# Patient Record
Sex: Female | Born: 1960 | Race: White | Hispanic: No | Marital: Married | Smoking: Former smoker
Health system: Southern US, Community
[De-identification: ages and names within clinical notes are randomized; demographics above are authoritative.]

## PROBLEM LIST (undated history)

## (undated) ENCOUNTER — Emergency Department (HOSPITAL_COMMUNITY): Admission: EM | Payer: BC Managed Care – PPO | Source: Home / Self Care

## (undated) DIAGNOSIS — Z9889 Other specified postprocedural states: Secondary | ICD-10-CM

## (undated) DIAGNOSIS — T4145XA Adverse effect of unspecified anesthetic, initial encounter: Secondary | ICD-10-CM

## (undated) DIAGNOSIS — M1990 Unspecified osteoarthritis, unspecified site: Secondary | ICD-10-CM

## (undated) DIAGNOSIS — K635 Polyp of colon: Secondary | ICD-10-CM

## (undated) DIAGNOSIS — F320 Major depressive disorder, single episode, mild: Secondary | ICD-10-CM

## (undated) DIAGNOSIS — A0472 Enterocolitis due to Clostridium difficile, not specified as recurrent: Secondary | ICD-10-CM

## (undated) DIAGNOSIS — K219 Gastro-esophageal reflux disease without esophagitis: Secondary | ICD-10-CM

## (undated) DIAGNOSIS — R112 Nausea with vomiting, unspecified: Secondary | ICD-10-CM

## (undated) DIAGNOSIS — T8859XA Other complications of anesthesia, initial encounter: Secondary | ICD-10-CM

## (undated) DIAGNOSIS — F32A Depression, unspecified: Secondary | ICD-10-CM

## (undated) DIAGNOSIS — G709 Myoneural disorder, unspecified: Secondary | ICD-10-CM

## (undated) DIAGNOSIS — K449 Diaphragmatic hernia without obstruction or gangrene: Secondary | ICD-10-CM

## (undated) DIAGNOSIS — E7800 Pure hypercholesterolemia, unspecified: Secondary | ICD-10-CM

## (undated) DIAGNOSIS — R519 Headache, unspecified: Secondary | ICD-10-CM

## (undated) DIAGNOSIS — K811 Chronic cholecystitis: Secondary | ICD-10-CM

## (undated) HISTORY — DX: Depression, unspecified: F32.A

## (undated) HISTORY — DX: Major depressive disorder, single episode, mild: F32.0

## (undated) HISTORY — PX: KNEE ARTHROSCOPY: SHX127

## (undated) HISTORY — PX: AUGMENTATION MAMMAPLASTY: SUR837

## (undated) HISTORY — PX: CHOLECYSTECTOMY: SHX55

## (undated) HISTORY — DX: Pure hypercholesterolemia, unspecified: E78.00

## (undated) HISTORY — PX: ABDOMINAL HYSTERECTOMY: SHX81

## (undated) HISTORY — DX: Polyp of colon: K63.5

## (undated) HISTORY — DX: Chronic cholecystitis: K81.1

## (undated) HISTORY — PX: TOTAL SHOULDER REPLACEMENT: SUR1217

## (undated) HISTORY — DX: Gastro-esophageal reflux disease without esophagitis: K21.9

## (undated) HISTORY — DX: Enterocolitis due to Clostridium difficile, not specified as recurrent: A04.72

## (undated) HISTORY — DX: Diaphragmatic hernia without obstruction or gangrene: K44.9

## (undated) HISTORY — DX: Unspecified osteoarthritis, unspecified site: M19.90

## (undated) HISTORY — PX: OTHER SURGICAL HISTORY: SHX169

## (undated) HISTORY — DX: Myoneural disorder, unspecified: G70.9

## (undated) HISTORY — PX: NECK SURGERY: SHX720

---
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
## 1987-10-29 HISTORY — PX: TOTAL ABDOMINAL HYSTERECTOMY W/ BILATERAL SALPINGOOPHORECTOMY: SHX83

## 2000-02-14 ENCOUNTER — Encounter: Admission: RE | Admit: 2000-02-14 | Discharge: 2000-02-14 | Payer: Self-pay | Admitting: General Surgery

## 2000-02-14 ENCOUNTER — Encounter: Payer: Self-pay | Admitting: General Surgery

## 2002-12-28 ENCOUNTER — Ambulatory Visit (HOSPITAL_BASED_OUTPATIENT_CLINIC_OR_DEPARTMENT_OTHER): Admission: RE | Admit: 2002-12-28 | Discharge: 2002-12-28 | Payer: Self-pay | Admitting: Orthopedic Surgery

## 2004-10-02 ENCOUNTER — Ambulatory Visit: Payer: Self-pay | Admitting: General Surgery

## 2005-04-23 ENCOUNTER — Ambulatory Visit: Payer: Self-pay | Admitting: Urology

## 2005-08-29 ENCOUNTER — Ambulatory Visit: Payer: Self-pay | Admitting: Unknown Physician Specialty

## 2005-10-08 ENCOUNTER — Ambulatory Visit: Payer: Self-pay | Admitting: General Surgery

## 2006-01-13 ENCOUNTER — Encounter: Admission: RE | Admit: 2006-01-13 | Discharge: 2006-01-13 | Payer: Self-pay | Admitting: Neurosurgery

## 2006-02-27 ENCOUNTER — Ambulatory Visit (HOSPITAL_COMMUNITY): Admission: RE | Admit: 2006-02-27 | Discharge: 2006-02-28 | Payer: Self-pay | Admitting: Neurosurgery

## 2006-03-10 ENCOUNTER — Ambulatory Visit: Payer: Self-pay | Admitting: Pain Medicine

## 2006-03-16 ENCOUNTER — Emergency Department: Payer: Self-pay | Admitting: Internal Medicine

## 2006-03-19 ENCOUNTER — Encounter: Admission: RE | Admit: 2006-03-19 | Discharge: 2006-03-19 | Payer: Self-pay | Admitting: Neurosurgery

## 2006-07-31 ENCOUNTER — Emergency Department: Payer: Self-pay | Admitting: Emergency Medicine

## 2006-10-28 DIAGNOSIS — A0472 Enterocolitis due to Clostridium difficile, not specified as recurrent: Secondary | ICD-10-CM

## 2006-10-28 DIAGNOSIS — K635 Polyp of colon: Secondary | ICD-10-CM

## 2006-10-28 HISTORY — PX: COLONOSCOPY: SHX174

## 2006-10-28 HISTORY — DX: Enterocolitis due to Clostridium difficile, not specified as recurrent: A04.72

## 2006-10-28 HISTORY — DX: Polyp of colon: K63.5

## 2006-10-28 HISTORY — PX: SPINE SURGERY: SHX786

## 2006-11-12 ENCOUNTER — Ambulatory Visit: Payer: Self-pay | Admitting: Rheumatology

## 2006-12-31 ENCOUNTER — Ambulatory Visit: Payer: Self-pay

## 2007-02-17 ENCOUNTER — Ambulatory Visit: Payer: Self-pay | Admitting: Unknown Physician Specialty

## 2007-03-16 ENCOUNTER — Ambulatory Visit: Payer: Self-pay | Admitting: Unknown Physician Specialty

## 2007-03-18 ENCOUNTER — Ambulatory Visit: Payer: Self-pay | Admitting: Pain Medicine

## 2007-04-23 ENCOUNTER — Ambulatory Visit: Payer: Self-pay | Admitting: Physical Medicine & Rehabilitation

## 2007-04-23 ENCOUNTER — Encounter
Admission: RE | Admit: 2007-04-23 | Discharge: 2007-07-22 | Payer: Self-pay | Admitting: Physical Medicine & Rehabilitation

## 2007-05-26 ENCOUNTER — Ambulatory Visit: Payer: Self-pay | Admitting: Physical Medicine & Rehabilitation

## 2007-07-03 ENCOUNTER — Ambulatory Visit: Payer: Self-pay | Admitting: Unknown Physician Specialty

## 2007-07-17 ENCOUNTER — Ambulatory Visit: Payer: Self-pay | Admitting: Physical Medicine & Rehabilitation

## 2007-09-14 ENCOUNTER — Encounter
Admission: RE | Admit: 2007-09-14 | Discharge: 2007-09-16 | Payer: Self-pay | Admitting: Physical Medicine & Rehabilitation

## 2007-09-14 ENCOUNTER — Ambulatory Visit: Payer: Self-pay | Admitting: Physical Medicine & Rehabilitation

## 2007-10-29 DIAGNOSIS — G709 Myoneural disorder, unspecified: Secondary | ICD-10-CM

## 2007-10-29 HISTORY — DX: Myoneural disorder, unspecified: G70.9

## 2007-12-07 ENCOUNTER — Encounter
Admission: RE | Admit: 2007-12-07 | Discharge: 2007-12-08 | Payer: Self-pay | Admitting: Physical Medicine & Rehabilitation

## 2007-12-08 ENCOUNTER — Ambulatory Visit: Payer: Self-pay | Admitting: Physical Medicine & Rehabilitation

## 2008-03-02 ENCOUNTER — Encounter
Admission: RE | Admit: 2008-03-02 | Discharge: 2008-03-04 | Payer: Self-pay | Admitting: Physical Medicine & Rehabilitation

## 2008-03-04 ENCOUNTER — Ambulatory Visit: Payer: Self-pay | Admitting: Physical Medicine & Rehabilitation

## 2008-05-27 ENCOUNTER — Encounter
Admission: RE | Admit: 2008-05-27 | Discharge: 2008-05-31 | Payer: Self-pay | Admitting: Physical Medicine & Rehabilitation

## 2008-05-31 ENCOUNTER — Ambulatory Visit: Payer: Self-pay | Admitting: Physical Medicine & Rehabilitation

## 2008-08-02 ENCOUNTER — Ambulatory Visit: Payer: Self-pay | Admitting: General Surgery

## 2008-08-23 ENCOUNTER — Encounter
Admission: RE | Admit: 2008-08-23 | Discharge: 2008-08-24 | Payer: Self-pay | Admitting: Physical Medicine & Rehabilitation

## 2008-08-24 ENCOUNTER — Ambulatory Visit: Payer: Self-pay | Admitting: Physical Medicine & Rehabilitation

## 2008-11-29 ENCOUNTER — Encounter
Admission: RE | Admit: 2008-11-29 | Discharge: 2008-11-30 | Payer: Self-pay | Admitting: Physical Medicine & Rehabilitation

## 2008-11-30 ENCOUNTER — Ambulatory Visit: Payer: Self-pay | Admitting: Physical Medicine & Rehabilitation

## 2009-02-07 ENCOUNTER — Encounter
Admission: RE | Admit: 2009-02-07 | Discharge: 2009-04-10 | Payer: Self-pay | Admitting: Physical Medicine & Rehabilitation

## 2009-02-10 ENCOUNTER — Ambulatory Visit: Payer: Self-pay | Admitting: Physical Medicine & Rehabilitation

## 2009-04-10 ENCOUNTER — Ambulatory Visit: Payer: Self-pay | Admitting: Physical Medicine & Rehabilitation

## 2009-05-30 ENCOUNTER — Encounter
Admission: RE | Admit: 2009-05-30 | Discharge: 2009-08-28 | Payer: Self-pay | Admitting: Physical Medicine & Rehabilitation

## 2009-06-06 ENCOUNTER — Ambulatory Visit: Payer: Self-pay | Admitting: Physical Medicine & Rehabilitation

## 2009-09-25 ENCOUNTER — Encounter
Admission: RE | Admit: 2009-09-25 | Discharge: 2009-10-18 | Payer: Self-pay | Admitting: Physical Medicine & Rehabilitation

## 2009-09-26 ENCOUNTER — Ambulatory Visit: Payer: Self-pay | Admitting: Physical Medicine & Rehabilitation

## 2009-12-01 ENCOUNTER — Ambulatory Visit: Payer: Self-pay | Admitting: Urology

## 2010-01-09 ENCOUNTER — Encounter
Admission: RE | Admit: 2010-01-09 | Discharge: 2010-01-31 | Payer: Self-pay | Admitting: Physical Medicine & Rehabilitation

## 2010-01-26 ENCOUNTER — Ambulatory Visit: Payer: Self-pay | Admitting: General Surgery

## 2010-01-31 ENCOUNTER — Ambulatory Visit: Payer: Self-pay | Admitting: Physical Medicine & Rehabilitation

## 2010-04-24 ENCOUNTER — Encounter
Admission: RE | Admit: 2010-04-24 | Discharge: 2010-07-23 | Payer: Self-pay | Admitting: Physical Medicine & Rehabilitation

## 2010-06-04 ENCOUNTER — Ambulatory Visit: Payer: Self-pay | Admitting: Physical Medicine & Rehabilitation

## 2010-07-06 ENCOUNTER — Ambulatory Visit: Payer: Self-pay | Admitting: Internal Medicine

## 2010-08-10 ENCOUNTER — Ambulatory Visit: Payer: Self-pay | Admitting: Internal Medicine

## 2010-09-11 ENCOUNTER — Encounter
Admission: RE | Admit: 2010-09-11 | Discharge: 2010-11-27 | Payer: Self-pay | Source: Home / Self Care | Attending: Physical Medicine & Rehabilitation | Admitting: Physical Medicine & Rehabilitation

## 2010-09-19 ENCOUNTER — Ambulatory Visit: Payer: Self-pay | Admitting: Physical Medicine & Rehabilitation

## 2010-11-12 ENCOUNTER — Ambulatory Visit: Payer: Self-pay | Admitting: Internal Medicine

## 2010-12-17 ENCOUNTER — Encounter: Payer: BC Managed Care – PPO | Attending: Physical Medicine & Rehabilitation

## 2010-12-17 ENCOUNTER — Ambulatory Visit: Payer: BC Managed Care – PPO | Admitting: Physical Medicine & Rehabilitation

## 2010-12-17 DIAGNOSIS — M47812 Spondylosis without myelopathy or radiculopathy, cervical region: Secondary | ICD-10-CM

## 2010-12-17 DIAGNOSIS — M5137 Other intervertebral disc degeneration, lumbosacral region: Secondary | ICD-10-CM | POA: Insufficient documentation

## 2010-12-17 DIAGNOSIS — M1710 Unilateral primary osteoarthritis, unspecified knee: Secondary | ICD-10-CM | POA: Insufficient documentation

## 2010-12-17 DIAGNOSIS — M7710 Lateral epicondylitis, unspecified elbow: Secondary | ICD-10-CM

## 2010-12-17 DIAGNOSIS — G890 Central pain syndrome: Secondary | ICD-10-CM | POA: Insufficient documentation

## 2010-12-17 DIAGNOSIS — M47817 Spondylosis without myelopathy or radiculopathy, lumbosacral region: Secondary | ICD-10-CM | POA: Insufficient documentation

## 2010-12-17 DIAGNOSIS — M961 Postlaminectomy syndrome, not elsewhere classified: Secondary | ICD-10-CM | POA: Insufficient documentation

## 2010-12-17 DIAGNOSIS — M76899 Other specified enthesopathies of unspecified lower limb, excluding foot: Secondary | ICD-10-CM | POA: Insufficient documentation

## 2010-12-17 DIAGNOSIS — M129 Arthropathy, unspecified: Secondary | ICD-10-CM | POA: Insufficient documentation

## 2010-12-17 DIAGNOSIS — IMO0002 Reserved for concepts with insufficient information to code with codable children: Secondary | ICD-10-CM

## 2010-12-17 DIAGNOSIS — M51379 Other intervertebral disc degeneration, lumbosacral region without mention of lumbar back pain or lower extremity pain: Secondary | ICD-10-CM | POA: Insufficient documentation

## 2010-12-31 ENCOUNTER — Ambulatory Visit: Payer: Self-pay | Admitting: Internal Medicine

## 2011-01-04 ENCOUNTER — Ambulatory Visit: Payer: Self-pay | Admitting: Internal Medicine

## 2011-03-12 NOTE — Assessment & Plan Note (Signed)
Kathryn Farrell is back regarding her right elbow pain. She has done well with  the last injection we performed at the right extensor mechanism of the  elbow. She is interested in another injection. She does like her Limbrel  as well as her neoprene elbow sleeve. She uses Norco for breakthrough  pain. She tries to stay active with exercise and she notes that some of  her weight lifting tends to exacerbate her arm symptoms. She describes  her pain as sharp, stabbing and aching. She rates the pain as 6/10. The  pain interferes with general activities and relations with others and  enjoyment of life on a moderate to severe level.   REVIEW OF SYSTEMS:  Notable for the above. She does have occasional  nausea. Does keep up with her bowel habits however.   SOCIAL HISTORY:  The patient is married. 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   PHYSICAL EXAMINATION:  Blood pressure 104/61, pulse 74. Respiratory rate  18. She is satting 98% on room air. The patient is pleasant, alert and  oriented x3. Affect is bright.  Gait is normal. She has pain still at  the right elbow on the lateral epicondyle. She has tenderness along the  brachioradialis as well. No skin color changes, atrophy or temperature  changes noted on either side. Strength remains 4+ to 5/5 with some pain  inhibition. Cognitively, she is appropriate.  HEART: Regular rate.  CHEST: Clear.  ABDOMEN: Soft and nontender.   ASSESSMENT:  1. 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  2. History of cervical disc disease with fusion.  3. History of colitis.  4. History of left knee pain with osteoarthritis.  5. History of questionable tendinitis at the brachioradialis muscle on      the right.   PLAN:  1. Will try to change the patient's analgesia cream to combination of      calamine, ketoprofen and Elavil. Observe for response. I would hold  off on another injection at this point.  2. Continue Limbrel and Norco. She uses the Limbrel on a scheduled      basis and is having good results with this.  3. Encouraged exercise and activity. May look at some interim bracing      for the right wrist particularly when she works out to see if it      takes some of the load off of the elbow.  4. I will see her back in about three months. She will call me with      questions.      Ranelle Oyster, M.D.  Electronically Signed     ZTS/MedQ  D:  12/08/2007 12:41:53  T:  12/09/2007 22:04:03  Job #:  [id number]   cc:   Wonda Cheng  Fax: [phone]

## 2011-03-12 NOTE — Assessment & Plan Note (Signed)
Kathryn Farrell is back regarding her right elbow pain.  She had nice results  with the right elbow injection we did in September.  The relief lasted  approximately a month and a half and she was thrilled with this.  She  continues to use the gel somewhat for the arm.  She has seen orthopedics  at [facility] for her left knee and they are saying that she may need a knee  replacement at some point soon.  She rates her pain today as 7 out of  10, described as sharp stabbing. tingling and aching.  The pain  interferes with general activity, relations with others and enjoyment of  life on a moderate to severe level.  She uses Neurontin at a low dose  (100 mg 1-2 t.i.d.).  She uses her Vicodin for breakthrough pain 1/2 to  1 q.6 hours p.r.n.  She continues to wear her splint for the right upper  extremity as well.   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   REVIEW OF SYSTEMS:  Notable for the above.  A full review is in the  written health and history section of the chart.  She does have some  nausea that has been persistent.   PHYSICAL EXAMINATION:  Blood pressure is 114/65, pulse is 51,  respiratory rate 18, saturating 100% in room air.  The patient is  generally pleasant, alert and oriented x3.  She has pain along the common extensor mechanism at the right lateral  epicondyle.  She has had some tenderness along the proximal  brachioradialis as well.  No skin color changes, atrophy or temperature  differences were noted from right to left.  Strength was 4+ to 5 out of  5 except for some pain inhibition at the wrist with extension.  Cognitively she is appropriate.  Heart is regular, chest is clear.  Abdomen soft, nontender.   ASSESSMENT:  1. 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  2. History of cervical disk disease effusion.  3. History of colitis.  4. History of persistent left knee  pain with osteoarthritis.  5. History of questionable tendinitis at the brachioradialis muscle on      the right.   PLAN:  1. After informed consent we injected along the right common extensor      mechanism of the elbow using 40 mg of Kenalog in 2 cc 1% lidocaine      which patient tolerated well.  I did not inject the brachioradialis      today.  2. Continue Neurontin as dosed.  3. Resume Limbrel 500 mg b.i.d.  4. Continue neoprene elbow sleeve.  5. Increase Vicodin and Norco 7.5/325 one q.8 hours p.r.n. for      breakthrough pain.  6. Consider long acting opiate.   I will see her back in about 3 month's time.      Ranelle Oyster, M.D.  Electronically Signed     ZTS/MedQ  D:  09/15/2007 12:04:32  T:  09/15/2007 20:25:01  Job #:  [id number]   cc:   Wonda Cheng  Fax: [phone]

## 2011-03-12 NOTE — Assessment & Plan Note (Signed)
Kathryn Farrell is back regarding her chronic pain issues.  I last saw her in  February.  We tried a prednisone taper for her hip and right knee pain.  She has seen Dr. Carolynne Edouard in January.  I received a report of this visit few  weeks ago.  Dr. Carolynne Edouard notes that she has some ongoing knee pain with  minimal chondral abnormalities.  She also discussed the patchy bone  marrow edema on the MRI.  She has a discussion in her note regarding her  mild synovitis which has responded to injections and oral steroid as  well.  She recommended pain management for the leg as well as  potentially the right hip MRI.  She gave her 15% permanent partial  disability rating.  She also thought she could benefit from seeing  rheumatologist again.  As far as I know, Kathryn Farrell's rheumatology lab work-  ups have been negative to this point.   In talking with Kathryn Farrell today, pain is persistent in the right knee  particularly when she is active.  The left knee bothers her someone as  the both hips.  Pain is present in the morning and usually worsens with  activity.  She is having other pain as well including her ankles  sometimes the upper neck and chest.  The right arm bothers her off and  on per baseline.  She rates her pain at 10/10 today.  She is taking  Norco 7.5 for breakthrough symptoms.  She uses trazodone for sleep.  She  is on Protonix per Dr. Matthias Hughs for her nausea with some relief.   She does try to stay active and exercise, but these usually ends up for  having more pain in general.  Does report insomnia due to her pain.  She  has some occasional tingling in the arms and legs as well as  constipation at times and abdominal discomfort.  She also reports  occasional shortness of breath.   REVIEW OF SYSTEMS:  Notable for the above.  Full 14-point review is in  the written health and history section of the chart.   SOCIAL HISTORY:  The patient is married and is working part-time at  [employer].   PHYSICAL EXAMINATION:   VITAL SIGNS:  Blood pressure is 108/64, pulse is  80, respiratory rate 18.  She is sating 98% on room air.  GENERAL:  The patient is generally pleasant, alert and oriented x3.  EXTREMITIES:  The patient has multiple areas of pain today and we tested  the bilateral knees as well as greater trochanter regions, the gluteal  areas, low back, bilateral elbows, shoulders, trapezius, suboccipital,  and chest regions.  Essentially 14/18 of these were positive for  tenderness today with palpation.  She had some mild crepitus still at  the right knee and had a lot of pain with palpation over the patellar  tendon portion of the knee.  Minimal swelling was seen.  There is no  discoloration or frank instability.  Both greater trochanters listed  above were tender with deep palpation.  Strength is generally 5/5 with  normal reflexes and sensation on exam today.  BACK:  Negative for any pain with flexion or extension.  Straight leg  testing was negative.  NEUROLOGIC:  Cognitively, she was well and within normal limits.  Cranial nerve exam was normal.  Emotionally, she is was appropriate.  HEART:  Regular.  CHEST:  Clear.  ABDOMEN:  Soft, nontender.   ASSESSMENT:  1. Persistent pain in the  right elbow, bilateral hips, and right knee      particularly.  She has had some ongoing issues with trochanteric      bursitis as well as osteoarthritis of the right knee and forearm      extensor mechanism injury.  She seems to be presenting with more      diffuse pain complaints now over the last few months as described      above.  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  She has tender points and some      clinical characteristics consistent with fibromyalgia as well.  2. History of cervical disk disease with fusion.   PLAN:  1. I will like to try to shift gears a bit in her treatments and treat      more from a central aspect.  We will initiate Cymbalta 30  mg p.o.      q.a.m. for now as well as Keppra 250 mg at bedtime and observe for      tolerance.  I would like to push these up as possible going      forward, but will stay conservative considering some of her history      of nausea in the past.  2. Norco for breakthrough pain as well as Voltaren gel for localized      tender areas.  3. Encourage activity and exercise to tolerance.  4. The patient certainly is limited with her every day living and      function at this point due to her pain complaints and fatigue.      Also, sleep is inhibited due to her pain at night.  Hopefully, some      of these measures we have taken today would allow her a little bit      more freedom in her every day activities.  I will see her back in      about 1 month's time for followup.      Ranelle Oyster, M.D.  Electronically Signed     ZTS/MedQ  D:  02/10/2009 12:18:48  T:  02/11/2009 02:45:38  Job #:  [id number]   cc:   Wonda Cheng  Fax: [phone]

## 2011-03-12 NOTE — Group Therapy Note (Signed)
CHIEF COMPLAINT:  Right arm/elbow pain.   HISTORY OF PRESENT ILLNESS:  This is a pleasant 50 year old white female  who suffered a right forearm injury when lifting some charts and boxes  at work. Patient had immediate pain. She had surgery apparently at the  elbow to repair the injured muscle. I do not have details of her  operation available to me. The surgery was performed by Dr. Merlyn Lot. She  did well initially then reinjured the arm at work again and has had  persistent pain since then. She states that she has been on multiple  medications including several antiinflammatories which have irritated  her stomach and caused ulceration. She has worn different splints at the  elbow and across the wrist. Has had multiple injections at the elbow  which have provided relief for varying amounts of time. She states the  last one she had may have helped her for about 2 months approximately.  She has had multiple courses of physical therapy.  She has had  ultrasound massage, iontophoresis, heat and cold modalities, etc. She  has a tens unit at home which provides some relief for a few hours. She  uses Vicodin now currently, half of a 5/500 tablet every 3 to 4 hours  p.r.n. Ice at home sometimes works but it causes stiffness.   The patient had been tried on Lyrica for a period of time for potential  radial neuritis, as well as Neurontin for a short period although she  had GI upset, which it turns out may have been more related to NSAIDs  she was taking. She had seen a pain clinic last year for initial  evaluation but has not been back to see them up until about a month ago.  I noted no specific treatment plan per se in their notes although they  mention potentially psychological evaluation, spinal cord stimulator,  and Neurontin, or Lyrica.   The patient states that the arm bothers her with most types of  activities. She particularly has problems with her ADLs in the morning.  She has problems  making her hair up for work. She has a lot of pain at  night when she sleeps when the arm is bent for a while and then she has  to try to stretch it out. The stretching out will cause a stabbing,  burring sensation at the elbow radiating down to the wrist. She has  difficultly manipulating charts at work. She has a granddaughter who she  cares for at home and has difficultly carrying her in the right arm and  really is forced to use her left arm to carry her. She has some  difficultly with typing but it is more related to the bent elbow  positioning rather than the actual manipulation with the fingers  themselves. The pain seems to be worse in the evening and at night,  although she does have it all throughout the day with activities  mentioned above. She denies any numbness in the hand. Pain usually is  more relegated to the elbow region with radiation occasionally down to  the wrist.   PAST MEDICAL HISTORY:  Notable for;  1. Colitis, NSAID induced gastritis/ulcers.  2. Cervical surgery by Dr. Gerlene Fee last year with a C5-C7 diskectomy      and fusion performed on Feb 27, 2006.  3. Hysterectomy in 1989.  4. Appendectomy in 2000.  5. Right forearm surgery in 2004.  6. Arthroscopic surgery on the knee this year performed  by Dr. Carolynne Edouard at      [facility]. It is interesting to note that the patient's neck surgery      lead to headaches and neck pain with left upper extremity symptoms      not right upper extremity symptoms.   CURRENT MEDICATIONS:  Include;  1. Vicodin 5/500 half q.3 to 4 hours as needed.  2. Sulfasalazine 500 mg 2 tablets a day.  3. Protonix 40 mg twice a day.  4. Phenergan 25 mg once a day as needed.  5. Extra Strength Tylenol 500 to 1000 mg a day.   ALLERGIES:  None.   FAMILY HISTORY:  Positive for heart disease, lung disease, high blood  pressure, diabetes.   SOCIAL HISTORY:  Patient is married. She does not smoke or drink. 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 She has  been in that roll for essentially 10 years.   REVIEW OF SYSTEMS:  Notable for the above as well as abdominal pain,  nausea, and vomiting. Full review is in the written health and history  section of the chart.   Radiographs. I have no imaging to refer to here at this visit. She does  have a report from 11/2006 of an x-ray of the right elbow stating that  the anchor is in good position.   PHYSICAL EXAMINATION:  Blood pressure is 111/69, pulse is 86,  respiratory rate 16, she is sating at 97% in room air. Patient is  generally pleasant, alert and oriented x3. Affect is bright and  appropriate. Gait is stable. Coordination is within normal limits in  hands and feet, as well as trunk and with gait. Reflexes are 2+.  Sensation is within normal limits.  HEART: Regular rate.  CHEST: Clear.  ABDOMEN: Soft, nontender.  Exam in the sensory function of both upper extremities __________  without any obvious abnormalities noted. We used pin prick and light  touch stimulation. Motor function appeared generally intact and  intrinsic muscle use of the hands bilaterally. She did have some  weakness with wrist extension and elbow flexion/extension with pain  inhibition noted. The extension of the right wrist and to a lesser  extent the extensor indicus muscles provoked pain today. Resisted biceps  flexion also seem to irritate her, as well as triceps extension. Patient  had intact shoulder movements and rotator cuff mobility today. Patient  had significant tenderness along palpation of the common extensor tendon  of the right arm, as well as the lateral epicondyle. She had 2  noticeable operative scars which were clean and appropriate in  appearance. No swelling was appreciated at the elbow. No color or  temperature changes were seen. She was not frankly allodinic although  she was sensitive to deep palpation of the muscle and along the bone.  The lateral  epicondyle was tender. I felt no obvious bony prominences on  exam today. Patient had normal cognitive function with intact cranial  nerve examination today. She had good awareness and was a good historian  overall. She was cooperative with all phases of my examination and  history taking process. Husband was present in the room with her today.   ASSESSMENT:  1. Chronic elbow pain with apparent forearm musculature injury  along      the extensor mechanism at the elbow. Patient with chronic lateral      epicondyle pain and epicondylitis, with inflammation of the common      extensor tendon  mechanism. I see no obvious nerve injury on      examination today. She has a normal nerve conduction EMG study from      over a year ago as well. She may have some centralization of her      pain due to the chronicity of the symptoms. She has responded to      steroids, injections, and oral steroids in the past but only for a      period of time while she is taking the medications or for weeks      after the particular injection. She has tried NSAIDs in the past      with ulcers and gastrointestinal upset as a result.  2. History of cervical disc disease and fusion disc surgery last year.  3. History of colitis.  4. History of arthroscopic knee surgery.   PLAN:  1. I think it is worth trying the patient on a topical      antiinflammatory such as Flector patches to treat the pain at the      source. We will begin the Flector patches b.i.d. to the right to      the right common extension tendon at the elbow. Patient was given      instruction on application. She should not have any      gastrointestinal side effects with this. Samples were given today.  2. Begin a trial of Limbrel LOX-5 inhibitor which will not have the      gastrointestinal side effects she has seen with her prior COX-1      and/or COX-2 inhibitors.  Nausea occasionally will take place with      this medication but it does not lead  to production of ulcers.  She      was given samples of this as well. We will use the 500 mg b.i.d.      dosing.  3. I asked the patient to purchase a right forearm Neoprene sleeve to      be placed over the extensor tendon mechanism to unload it      essentially.  4. After the first 2 to 3 weeks if she is not having improvement in      her pain, would look at starting Neurontin 100 mg at bedtime      increasing to t.i.d. over 2 weeks time. We would be focusing on      essential components of her pain by using this. I am not sure of      how much benefit she will see with this but I think it is worth      trying nonetheless.  5. Patient may benefit from another MRI of the elbow to discern      ongoing pathology at the tendon/bone.  6. Consider long acting opiates.  7. Refill to Vicodin for breakthrough pain half to one q. 6 hours      p.r.n.  8. I will see the patient back in about 4 to 6 weeks time.      Ranelle Oyster, M.D.  Electronically Signed     ZTS/MedQ  D:  04/28/2007 12:39:22  T:  04/28/2007 16:41:03  Job #:  [id number]   cc:   Wonda Cheng  Fax: [phone]

## 2011-03-12 NOTE — Assessment & Plan Note (Signed)
Kathryn Farrell is back regarding her right elbow pain.  She finds that the  [employer] are helpful and she wears them primarily at night.  She  has a hard time keeping them on during the day and does not use them  during the day.  She feels that the Limbrel and the Neurontin may be  helping somewhat as well.  She thought the Neurontin sedated her at  least initially, but she is doing a bit better now.  She uses Vicodin  for breakthrough pain.  She was active with the arm over the weekend and  felt the secondary pain afterwards.  The patient is wearing a Neoprene  sleeve over the extensor mechanism of the forearm, which she seems to  think helps somewhat as well.  She rates her pain as 7 out of 10 today.  She states the pain is sharp, burning, stabbing, aching.  The pain  interferes with general activity, relationships with others, and  enjoyment of life on a moderate level.   REVIEW OF SYSTEMS:  Notable for nausea occasionally.  A full review is  in the written Health and History section.   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  She tries to walk.  She has been  limited somewhat with her upper extremity exercises due to the elbow.   PHYSICAL EXAMINATION:  VITAL SIGNS:  Blood pressure 105/51, pulse of 70,  respiratory rate 18, saturation 100% on room air.  GENERAL:  The patient is pleasant, alert and oriented x3.  PAIN AND REHAB EVALUATION:  She still has pain with palpation of the  extensor mechanism and lateral upper condyle of the right elbow.  No  swelling or temperature changes are noted.  Strength is generally 4+ to  5 out of 5 with some pain inhibition noted to the wrist and elbow.  No  bony prominences or crepitus is appreciated with movement today.  Cognitively, the patient is appropriate.  HEART:  Regular.  CHEST:  Clear.  ABDOMEN:  Soft and nontender.   ASSESSMENT:  1. Chronic elbow pain related to forearm  muscular injury along the      extensor mechanism of the right elbow.  2. History of cervical disk disease and effusion.  3. History of colitis.  4. History of arthroscopic knee surgery and persistent left knee pain.   PLAN:  1. Continue Flector Patches at night.  2. Will try Diclofenac Gel 1% during the day for elbow as an      antiinflammatory.  3. Continue Limbrel and Neurontin at current dosing.  4. Vicodin for breakthrough pain.  5. Continue Neoprene elbow sleeve.  6. Consider Alpha-Stim and biofeedback therapy in the future depending      on course.  7. We discussed the potential upper extremity exercises she could      tolerate, although I think most weight      resistance exercises will be difficult due to stress on the elbow      and forearm.  8. I will see her back in about two months time.      Ranelle Oyster, M.D.  Electronically Signed     ZTS/MedQ  D:  05/27/2007 10:30:32  T:  05/27/2007 23:16:09  Job #:  [id number]   cc:   Wonda Cheng  Fax: [phone]

## 2011-03-12 NOTE — Assessment & Plan Note (Signed)
Shivaun is back regarding her chronic pain.  Evalynn did fairly well to  Cymbalta, but stated she could not afford the brand name co-pay.  We  switched over to Effexor and she stopped this as it was causing her to  be jittery and shaky.  She did like the Keppra and seemed to help her  with sleep and pain at nighttime at a 250 mg dose.  She continues to  have pain in the elbow, right knee, and hip as well as multiple other  areas.  She feels that she needs pain medication such as the hydrocodone  to get through any physical activity.  She likes to be active, in and  out of the house, and this is frustrating for her.  She rates her pain 9-  10/10, described as sharp, burning, stabbing, tingling, aching, and  constant.  Pain interferes with general activity, relations with others,  enjoyment of life on a severe level.   Pain worsens with walking, bending, sitting, and standing, improves with  rest, heat, and medications at times.   REVIEW OF SYSTEMS:  Notable for nausea, diarrhea, constipation,  occasional abdominal pain, and weight loss.  Other pertinent positives  are above.  Full 14 point review is in the written health and history  section of the chart.   SOCIAL HISTORY:  The patient is married living with husband and daughter  and still is working.  She is having continued issues related to her  case.   PHYSICAL EXAMINATION:  VITAL SIGNS:  Blood pressure is 103/61, pulse is  82, respiratory rate 18, and she is sating 100% on room air.  EXTREMITIES:  The patient has multiple tender areas throughout the trunk  and limbs today.  The right hip was particularly tender at greater  trochanter region.  Strength is 5/5 with normal sensory function.  Did  have some pain inhibition occasionally at the effected joints.  NEUROLOGIC:  She is intact with good cognition insight.  Cranial nerve  exam is normal.  HEART:  Regular rate.  CHEST:  Clear.  ABDOMEN:  Soft and nontender.   ASSESSMENT:  1. Persistent pain related to osteoarthritis of the right knee,      trochanteric bursitis, and forearm extensor mechanism injury.  2. Central pain/sensitization syndrome.  3. History of cervical disk disease with fusion.   PLAN:  1. We will try lower-dose Effexor 37.5 mg b.i.d.  2. Increase Keppra to 500 mg nightly and wean off trazodone.  3. After informed consent, we injected the right hip via lateral      approach using 60 mg of Kenalog and 4 mL of 1% lidocaine.  The      patient tolerated well.  I encouraged ice and stretching.  4. Ultimately, we need to improve her mood.  If Effexor proves      unsuccessful, we need to look at other routes to treat her problem.  5. We will refill hydrocodone as needed over the next to 3 weeks.  She      is receiving 7.5 one q.8 h. p.r.n., #90 typically.      Ranelle Oyster, M.D.  Electronically Signed     ZTS/MedQ  D:  04/10/2009 13:41:44  T:  04/11/2009 04:13:09  Job #:  [id number]   cc:   Wonda Cheng  Fax: [phone]

## 2011-03-12 NOTE — Assessment & Plan Note (Signed)
Kathryn Farrell is back regarding her right elbow pain.  She is doing fairly well  with ketamine cream.  She has been a bit more sore at the right elbow  with extra work and moving going on at her job.  They just changed  office locations.  The right knee has been a bit more tender as of late.  She has not seen orthopedics for sometime.  I reviewed her MRI done  February 02, 2008, and showed some nonspecific bone marrow foci that are  faintly visible on T1 images.  Symptoms, however, in the knee seemed to  be more articular and she had swelling with extra activity.  She does  complain a lot of frank bone pain.  She is working full time still.  She  is on Enbrel and Norco for pain control.  She occasionally wears a knee  sleeve for support.  She rates her pain as 6/10, described as a sharp,  burning, aching, and constant.  Pain interferes with general activity,  relations with others, and enjoyment of life on a moderate level.   REVIEW OF SYSTEMS:  Notable for occasional nausea.  Other pertinent  positives as above.  Full review is in the health and history section  chart.   SOCIAL HISTORY:  The patient is married and no specific changes noted  today.  She is still working as an Print production planner.   PHYSICAL EXAMINATION:  VITAL SIGNS:  Blood pressure is 114/49, pulse is  79, respiratory rate 20, and she is sating 97% on room air.  GENERAL:  The patient is pleasant, alert, and oriented x3.  Affect is  bright and appropriate.  MUSCULOSKELETAL:  She continues to have some tenderness along the  lateral epicondyle and brachioradialis.  No skin color or temperature  changes are appreciated.  The strength is 4+ to 5/5.  Right knee is  notable for a little bit of crepitus with extension and some pain with  meniscal maneuvers.  Minimal peripatellar swelling is appreciated.  She  has been antalgic on the right side with gait.  HEART:  Regular.  CHEST:  Clear.  ABDOMEN:  Soft and nontender.   ASSESSMENT:  1.  Chronic elbow pain related to forearm muscular injury along the      extensor mechanism.  2. Cervical disk disease with fusion.  3. Brachioradialis tendonitis on the right.  4. Osteoarthritis of the right knee.   PLAN:  1. Continue ketamine cream.  2. We will try getting  conductive garment for right elbow.  3. The patient's Norco was refilled recently.  4. After informed consent, we injected the right knee via lateral      approach with 40 mg of Kenalog and 3 mL of      1% lidocaine.  The patient tolerated well.  5. I will see her back in about 4 months.      Ranelle Oyster, M.D.  Electronically Signed     ZTS/MedQ  D:  05/31/2008 09:50:17  T:  06/01/2008 03:57:32  Job #:  [id number]   cc:   Wonda Cheng  Fax: [phone]

## 2011-03-12 NOTE — Assessment & Plan Note (Signed)
Kathryn Farrell is back regarding her chronic pain.  Her elbow has been doing  fairly well over the last few months.  Her hips doing well for about a  month ago after the injections.  She notes that overall pain there in  long term has increased since she has not been as active in the gym and  exercising, etc.  She did lose her job last month and is in search for a  new position.  This has been stressful and depressing for her.  She  rates her pain as 7/10, described as burning, stabbing, and constant  aching.  Pain interferes with general activity, relations with others,  enjoyment of life on a moderate-to-severe level.   REVIEW OF SYSTEMS:  Notable for the above.  She does report occasional  nausea, though this seems to have improved as a whole.  Full review is  in the written health and history section of the chart.   SOCIAL HISTORY:  As noted above.  She remains married and hopes to be  back working soon.   PHYSICAL EXAMINATION:  VITAL SIGNS:  Blood pressure is 197/61, pulse is  70, respiratory rate 18, and she is sating 99% on room air.  GENERAL:  The patient is pleasant, alert, and oriented x3.  Affect is  bright and appropriate.  MUSCULOSKELETAL:  The patient continues to have pain over the right  forearm and lateral epicondyle areas, although somewhat stable to  improve.  Pulses are 2+.  She had crepitus in the right knee.  Both  greater trochanters were tender to palpation, although she could bend  and move her hips with minimal discomfort today.  Strength is 5/5 in all  extremities, except for some pain inhibition in the right wrist and  forearm.  Sensory exam is grossly intact.  HEART:  Regular.  CHEST:  Clear.  ABDOMEN:  Soft and nontender.   ASSESSMENT:  1. Chronic elbow pain related to forearm muscular injury along the      extensor mechanism.  2. Cervical disk disease with fusion.  3. Brachioradialis tendonitis on the right.  4. Osteoarthritis of the right knee.  5.  Bilateral greater trochanter bursitis.   PLAN:  1. Recommended the use of Voltaren gel over the hips for now as well      as ice, heat, and stretching.  She needs to get back on a routine,      which we discussed at length today.  Also, recommended just routine      in general as far as her exercise, job search, other activities,      etc, to add some form to her day.  2. May use Norco for breakthrough pain.  3. Consider followup hip injections depending upon her course.  4. I did give her brief prednisone taper to deal with the pain here in      the short term to help get her off to a good      start.  5. We will see her back in about 3 months' time.      Ranelle Oyster, M.D.  Electronically Signed     ZTS/MedQ  D:  11/30/2008 10:58:23  T:  12/01/2008 00:13:19  Job #:  [id number]   cc:   Wonda Cheng  Fax: [phone]

## 2011-03-12 NOTE — Assessment & Plan Note (Signed)
Kathryn Farrell is back regarding her right elbow pain.  She has done a bit  better with addition of the ketamine into the cream.  She is using  Limbrel, as well; sometimes a Neoprene elbow sleeve regarding her knee  pain and they commented on some edema at the bone.  She does not  remember the details.  They had mentioned RSD, as well.  Patient rates  her pain as 7/10, describes it as sharp, burning, stabbing, aching.  Pain interferes with general activity, relations with others, enjoyment  of life on a moderate to severe level.   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  She is very busy with her job and active there.   REVIEW OF SYSTEMS:  Notable for the above.  Full review is in the  written health history section of the chart.   PHYSICAL EXAM:  Blood pressure is 113/56, pulse 73, respiratory rate 18.  She is satting 97% on room air.  Patient is pleasant, alert and oriented times three.  She continues to  have some pain along the elbow and the right lateral epicondyle.  Tenderness is also along the brachioradialis.  No skin color or  temperature changes are noted once again.  Strength is 4+/5 with pain  inhibition in wrist and finger extension on the right.  Cognitively, she  is appropriate.  She also has some mild pain around the knee.  No  obvious antalgia is seen in her gait.  HEART:  Heart is regular.  CHEST:  Clear.  ABDOMEN:  Soft, nontender.   ASSESSMENT:  1. Chronic elbow pain related to forearm muscular injury along the      extensor mechanism.  2. Cervical disc disease with fusion.  3. Questionable brachioradialis tendinitis on the right.   PLAN:  1. We will increase ketamine cream to 4%, staying with the same      ketoprofen and Elavil.  2. We will send her for conductive garment to the right elbow to see      if this provides pain relief.  3. We will review her knee MRI as available to me.  I doubt that she      has RSD.  4.  Continue Limbrel and Norco as written.  5. I will see her back in about three to four months' time.      Ranelle Oyster, M.D.  Electronically Signed     ZTS/MedQ  D:  03/04/2008 13:16:47  T:  03/04/2008 13:42:40  Job #:  [id number]   cc:   Wonda Cheng  Fax: [phone]

## 2011-03-12 NOTE — Assessment & Plan Note (Signed)
Kathryn Farrell is back regarding her elbow pain.  She complains of more diffuse  pain today throughout her body, particularly in her hips, radiating down  to her legs.  It seems that she is being more limited with activity.  She denies any medication changes or problems with depression or  increased anxiety.  She has concerns that there may be an underlying  process causing this.  It seems to be worsening over the last few  months.  She uses Norco for breakthrough pain, has been requiring more  of that lately.  She has backed off her Limbrel, which she had been  using more often than not in the past.  She saw Dr. Matthias Hughs regarding  her nausea and it sounds as if he is considering a gastritis versus an  ulcer-type situation.  She was seen back in a few weeks for followup.  The patient rates her pain is 7/10 depending on activity levels, and she  described it as sharp, burning, stabbing, aching, and constant.  Pain  interferes with general activity, in relationship with others, and  enjoyment of life on moderate-to-severe level.  Pain in the shoulders,  right arm, knees, back, hips, legs, etc.   REVIEW OF SYSTEMS:  She continues to have some nausea and vomiting on  occasion.  Other pertinent positives are above and full review is in the  written health and history section in the chart.   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   PHYSICAL EXAMINATION:  VITAL SIGNS:  Blood pressure 118/52, pulse 72,  respiratory rate 18, and she is sating 98% on room air.  GENERAL:  The patient is pleasant, alert, and oriented x3.  Affect is  generally bright and appropriate.  She has some pain still on the right  lateral epicondyle and brachioradialis muscles.  Skin color is stable.  Right knee is notable for a bit of crepitus.  She has minimal swelling  there.  She has pain over both greater trochanteric regions and  radiation of that pain down to the knees.  No clicking is  appreciated at  the knees with flexion extension today.  Gait is generally stable.  HEART:  Regular.  CHEST:  Clear.  ABDOMEN:  Soft and nontender.  Weight is unchanged.   ASSESSMENT:  1. Chronic elbow pain related to forearm muscular injury along with      extensor mechanism.  2. Cervical disk disease with fusion.  3. Brachioradialis tendonitis on the right.  4. Osteoarthritis of the right knee.  5. Bilateral greater trochanter bursitis.  6. Diffuse body pain/central pain syndrome versus inflammatory      process.   PLAN:  1. Continue analgesic cream as appropriate.  2. Refill Norco.  3. After informed consent, we injected both hips at the greater      trochanters using 40 mg of Kenalog and 3 mL of 1% lidocaine.  The      patient tolerated it well.  4. We will send the patient for lupus panel as well as rheumatoid      factor, sed rate, C3, C4 levels as well as protein C and S, CBC      with diff, and CMET.  5. GI followup per Dr. Matthias Hughs.  6. I will call back pending results of the above.      Ranelle Oyster, M.D.  Electronically Signed     ZTS/MedQ  D:  08/24/2008 14:22:51  T:  08/25/2008 01:33:10  Job #:  [id number]   cc:   Wonda Cheng  Fax: [phone]

## 2011-03-12 NOTE — Assessment & Plan Note (Signed)
The patient is back regarding her right elbow pain.  The elbow itself is  doing a little better.  The gel helped somewhat.  She is still having a  lot of pain at the elbow and actually proximal to this.  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  She did have assistance while she was moving  him but she feels that she irritated the arm then.  She rates her pain  as 6/10 to 7/10.  She states that it seems to be worst in the morning  when she awakens, and does a bit better once she loosens it up to a  certain extent.  She still uses Vicodin for breakthrough pain; have her  on Limbrel and Neurontin currently.  She is still seeing GI medicine for  an inflammatory bowel disorder.  The patient wears her elbow sleeve at  work and at home; this seems to give some relief.   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   REVIEW OF SYSTEMS:  Notable for the above, as well as occasional nausea.   PHYSICAL EXAMINATION:  Blood pressure is 110/62, pulse is 69.  She is  saturating at 100% on room air.  The patient is pleasant, alert and  oriented x3.  Affect is extremely bright and appropriate.  Right elbow  still has pain upon palpation of the extensor mechanism and lateral  epicondyle.  She did also have significant tenderness along the proximal  brachioradialis and tendinis origin.  No obvious atrophy, skin color, or  temperature changes are noted.  Strength is generally 4+/5 to 5/5 still  in the right upper extremity.  Cognitively she is intact.  HEART:  Regular rate and rhythm.  LUNGS:  Clear.  ABDOMEN:  Soft.  Nontender.   ASSESSMENT:  1. 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  2. History of cervical disc disease and a fusion.  3. History of colitis.  4. History of arthroscopic knee surgery and persistent left  knee pain.  5. Questionable tendinitis in the brachioradialis muscle on the right.   PLAN:  1. After inform consent we injected the proximal tendinis origin in      the brachioradialis using 40 mg of Kenalog and 2 mL of 1%      lidocaine.  The patient tolerated it well.  2. Continue brace wear, Limbrel and Neurontin as well.  She will wear      her Neoprene Elbow Sleeve additionally.  3. Use Vicodin for breakthrough pain.  4. Still consider Alpha-Stim and biofeedback therapy.  5. I will see her back in about 2 months' time.      Ranelle Oyster, M.D.  Electronically Signed     ZTS/MedQ  D:  07/21/2007 11:04:04  T:  07/21/2007 12:49:31  Job #:  [id number]   cc:   Wonda Cheng  Fax: [phone]

## 2011-03-12 NOTE — Assessment & Plan Note (Signed)
Kathryn Farrell is back regarding her chronic pain.  She did well nicely with the  right hip injection and is really essentially pain-free except when she  walks long distances.  She is up to 500 mg on the Keppra at night and  this has generally worked almost every night for sleep and her pain.  She is taking trazodone occasionally for nights when she has insomnia.  She was unable to tolerate the Effexor due to feelings of jitteriness  and she stopped this.  She has been on vacation.  She has job interview  pending and overall her mood is improved.  She denies any frank  depression or anxiety at this point.  Pain is 6-7/10 and described as  sharp burning, dull, stabbing, and aching.  Pain interferes with general  activity, relations with others, enjoyment of life on a moderate level.   REVIEW OF SYSTEMS:  Notable for the above.  Full 14 point review is in  the written health and history section of the chart.  Does report some  persistent nausea and she follows up with GI in this regard later this  summer.   SOCIAL HISTORY:  Noted above.  She is married and is living with her  husband.  She has also been exercising and working on weight loss in  general.   PHYSICAL EXAMINATION:  VITAL SIGNS:  Blood pressure is 114/67, pulse is  85, respiratory rate 18.  She is sating 100% on room air.  GENERAL:  The patient was moving much better today.  She had normal gait  today.  EXTREMITIES:  No obvious balance deficits or weight/posture issues.  Strength is 5/5 and normal sensory function.  She has still has some  tender points in the legs and arms.  NEURO:  Mood is excellent.  Cognitively, she is intact.  HEART:  Regular.  CHEST:  Clear.  ABDOMEN:  Soft, nontender.   ASSESSMENT:  1. Centralized pain syndrome.  2. Osteoarthritis of the right knee.  3. Inflammation of the forearm extensor mechanism, status post injury.  4. Right greater trochanter bursitis.  5. History of cervical disk disease or  fusion.   PLAN:  1. We will stay with Keppra 500 mg nightly.  She may also use      trazodone low-dose 25-50 mg nightly p.r.n.  2. Norco 7.5 one q.6 h p.r.n.  3. Encouraged ice, stretching exercise etc., particularly for right      hip and low back.  4. Encouraged social activities and relaxation.  5. Should call me back with any problems, but otherwise, I will see      her in 4 months.      Ranelle Oyster, M.D.  Electronically Signed     ZTS/MedQ  D:  06/06/2009 10:56:22  T:  06/06/2009 22:44:22  Job #:  [id number]   cc:   Wonda Cheng  Fax: [phone]

## 2011-03-15 NOTE — Op Note (Signed)
NAMEAPPOLLONIA, KLEE               ACCOUNT NO.:  [id number]   MEDICAL RECORD NO.:  [id number]          PATIENT TYPE:  OIB   LOCATION:  3014                         FACILITY:  [facility]   PHYSICIAN:  Reinaldo Meeker, M.D. DATE OF BIRTH:  Sep 18, 1961   DATE OF PROCEDURE:  02/27/2006  DATE OF DISCHARGE:                                 OPERATIVE REPORT   PREOPERATIVE DIAGNOSIS:  Herniated disk and instability, C4-5, C5-6.   POSTOPERATIVE DIAGNOSIS:  Herniated disk and instability, C4-5, C5-6.   PROCEDURE:  C4-5, C5-6 anterior cervical diskectomy with bone bank fusion,  followed by AcuFix anterior cervical plating.   SURGEON:  Reinaldo Meeker, M.D.   ASSISTANT:  Tia Alert, MD   PROCEDURE IN DETAIL:  After being placed in the supine position in 5 pound  halter traction, the patient's neck was prepped and draped in the usual  sterile fashion.  A localizing x-ray was taken prior to incision to identify  the appropriate level.  A transverse incision was made in the right anterior  neck starting at the midline, heading towards the medial aspect of the  sternocleidomastoid muscle.  The platysma muscle was incised transversely.  The natural fascial plane between the strap muscles medially and the  sternocleidomastoid laterally was identified and followed down to the  anterior aspect of the cervical spine.  The longus colli muscles were  identified and split in the midline centrally bilaterally with _____  resector and keel _____ .  A secondary x-ray was taken to confirm we had  approached the appropriate level, and this was correct.  A self-retaining  retractor was placed for exposure.  The annulus of the disk at C4-5 and C5-6  was incised.  Using pituitary rongeurs and curettes, approximately 90% of  the disk material at both levels was removed.  A high speed drill was used  to widen the interspace and decorticate the inferior edges and superior  edges of the intervertebral spaces.  The  microscope was draped, brought into  the field, and used for the remainder of the case.  Using microdissection  technique, the remainder of the disk material along the posterior  longitudinal ligament was removed.  The lumen was incised transversely, and  the cut edge removed with a Kerrison punch to decompress the underlying  spinal dura and proximal nerve roots bilaterally.  This was done at C4-5 and  C5-6 to complete the decompression.  At this time, inspection was carried  out in all directions without any evidence of residual compression, and none  could be identified.  Two bone banks plugs were reconstituted with lordotic  curve, going from 4-6 mm in size.  After irrigating once more and confirming  hemostasis, the plugs were then packed without difficulty.  Fluoroscopy  showed them to be in good position.  An appropriate length AcuFix anterior  cervical plate was then chosen.  Under fluoroscopic guidance, drill holes  were placed, followed by placement of 13 mm screws x6.  A locking mechanism  was secured, and final fluoroscopy showed the plate, screws, and plugs to  all  be in good position.  Large amounts of irrigation were carried out at  this time, and then bleeding  controlled with bipolar coagulation.  The wound was then closed using  interrupted Vicryl on the platysma, some inverted 5-0 PDS in the  subcuticular layer and Steri-Strips on the skin.  A sterile dressing and  soft collar were applied, and the patient was extubated and taken to the  recovery room in stable condition.           ______________________________  Reinaldo Meeker, M.D.     ROK/MEDQ  D:  02/27/2006  T:  02/27/2006  Job:  [id number]

## 2011-03-15 NOTE — Op Note (Signed)
NAMENORENA, BRATTON                         ACCOUNT NO.:  [id number]   MEDICAL RECORD NO.:  [id number]                   PATIENT TYPE:  AMB   LOCATION:  DSC                                  FACILITY:  [facility]   PHYSICIAN:  Cindee Salt, M.D.                    DATE OF BIRTH:  05-25-1961   DATE OF PROCEDURE:  12/28/2002  DATE OF DISCHARGE:                                 OPERATIVE REPORT   PREOPERATIVE DIAGNOSIS:  Radial tunnel syndrome, lateral epicondylitis right  arm.   POSTOPERATIVE DIAGNOSIS:  Radial tunnel syndrome, lateral epicondylitis  right arm.   OPERATION:  Release radial nerve, reconstruction lateral epicondyle origin  sensor musculature right elbow.   SURGEON:  Cindee Salt, M.D.   ASSISTANTCarolyne Fiscal, nurse specialist.   ANESTHESIA:  General anesthesia.   INDICATIONS FOR PROCEDURE:  The patient is a 50 year old female with a  history of lateral epicondylitis with multiple injections, significant  atrophy, radial nerve symptoms with positive nerve conductions.   DESCRIPTION OF PROCEDURE:  The patient is brought to the operating room  where a general anesthetic was carried out without difficulty.  She was  prepped using DuraPrep in the supine position with right arm free.  The  radial tunnel was approached first through an anterior approach.  The  incision was made in the antecubital fossa carried slightly to the radial  side, carried down through subcutaneous tissue.  The lateral antebrachial  cutaneous nerve of the forearm was identified and protected. The dissection  carried down between the brachialis and the brachial radialis, wrist  extensors, carried down through the fascia.  The radial nerve was identified  proximally.  This was traced distally, followed down as the radial sensory  nerve.  The motor branch was then noted to branch and enter into the  supinator.  There was a very firm fibrous band in the supinator which showed  a significant compression of  the radial motor branch.  Significant  irritation was present to the motor branch and this was entirely released  superficial supinator.  The wound was copiously irrigated with saline.  The  subcutaneous tissue was closed with interrupted 4-0 Vicryl and the skin with  subcuticular 4-0 Monocryl suture.  A separate incision was then made on the  lateral aspect of the elbow through the area of skin deformity, carried down  through subcutaneous tissue.  There was virtually no subcutaneous tissue.  Significant atrophy of the fascia was present.  This was opened.  The joint  opened and a large frond was removed.  There was no significant change in  the radial head.  The extensor origin was then split.  Significant  degeneration of the extensor carpi radialis brevis was present. This was  removed with a rongeur.  The area of the epicondyle roughened and a 3.5  Statak anchor was inserted.  The wrist extensor was  then repaired with a  modified Bennell bringing this back into position onto the roughened bone.  This was done with the elbow in flexion and the wrist extended.  The area  was irrigated.  The extensor origin was closed with figure-of-eight 4-0  Vicryl sutures.  The fascia was closed with 4-0 Vicryl after irrigation.  The subcutaneous tissue with 4-0 Vicryl and the skin with subcuticular 4-0  Monocryl suture.  It was decided not to proceed with an anconeus transfer in  that the superficial portion of the extensor origin showed good tissue.  A  sterile compressive dressing and splint  was applied.  This was long-arm in nature.  The patient tolerated the  procedure well and was taken to the recovery room for observation in  satisfactory condition.  She is discharged home to return to the [facility] [place] in one week on Percocet and Keflex.                                               Cindee Salt, M.D.    Angelique Blonder  D:  12/28/2002  T:  12/28/2002  Job:  [id number]

## 2011-04-12 ENCOUNTER — Encounter
Payer: BC Managed Care – PPO | Attending: Physical Medicine & Rehabilitation | Admitting: Physical Medicine & Rehabilitation

## 2011-04-12 DIAGNOSIS — M25519 Pain in unspecified shoulder: Secondary | ICD-10-CM | POA: Insufficient documentation

## 2011-04-12 DIAGNOSIS — M1710 Unilateral primary osteoarthritis, unspecified knee: Secondary | ICD-10-CM | POA: Insufficient documentation

## 2011-04-12 DIAGNOSIS — M47817 Spondylosis without myelopathy or radiculopathy, lumbosacral region: Secondary | ICD-10-CM | POA: Insufficient documentation

## 2011-04-12 DIAGNOSIS — G890 Central pain syndrome: Secondary | ICD-10-CM | POA: Insufficient documentation

## 2011-04-12 DIAGNOSIS — IMO0002 Reserved for concepts with insufficient information to code with codable children: Secondary | ICD-10-CM

## 2011-04-12 DIAGNOSIS — M76899 Other specified enthesopathies of unspecified lower limb, excluding foot: Secondary | ICD-10-CM | POA: Insufficient documentation

## 2011-04-12 DIAGNOSIS — M7710 Lateral epicondylitis, unspecified elbow: Secondary | ICD-10-CM

## 2011-04-12 DIAGNOSIS — M47812 Spondylosis without myelopathy or radiculopathy, cervical region: Secondary | ICD-10-CM

## 2011-04-12 DIAGNOSIS — M25559 Pain in unspecified hip: Secondary | ICD-10-CM | POA: Insufficient documentation

## 2011-04-12 NOTE — Assessment & Plan Note (Signed)
Tearra is back regarding her multiple pain complaints.  She is really been generally doing well.  She complains of some shoulder and hip pain, yet she is trying to run on a regular basis.  She feels that she is tired in the morning and does not have the energy she wants.  She rates her pain as 7/10.  Pain is stabbing, tingling, and aching.  She is not stretching before she exercises.  REVIEW OF SYSTEMS:  Notable for nausea.  Other pertinent positives as above.  Full 12-point review is in the written health and history section of the chart.  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  She is getting married in September.  She is very excited about that.  PHYSICAL EXAMINATION:  VITAL SIGNS:  Blood pressure 111/62, pulse 74, respiratory rate 18, she is satting 98% on room air. GENERAL:  The patient is pleasant and alert.  She is in good shape in general.  She has some mild pain along the hips.  Shoulders seem be reasonably functional from a standpoint range of motion and strength. HEART:  Regular rate. CHEST:  Clear. ABDOMEN:  Soft, nontender.  ASSESSMENT: 1. Centralized pain syndrome. 2. Mild lumbar spondylosis. 3. Greater trochanter bursitis, right greater than left. 4. Osteoarthritis, right knee. 5. History of right forearm extensor mechanism injury.  PLAN: 1. I am happy that the patient is exercising.  I think she needs to     vary her regimen a bit to avoid excessive running given her     diagnoses.  Also reviewed specific stretches for lower extremities     and trunk to perform before she exercises. 2. Suggested stopping Keppra to see if she is still able to sleep and     perhaps avoid the morning sedation or fatigue. 3. We discussed the opioid hyperalgesia and the effects of the short-     acting medications, short-acting hydrocodone over time.  I     challenged her to begin weaning this over the next 3-4 months.  I     would like  to see a drop half a pill per month to get down in the     range of 2-3 pills per day in total by the fall.  She is able to     get down further that would be great. 4. I will see her back in about 4 months.     Ranelle Oyster, M.D. Electronically Signed    ZTS/MedQ D:  04/12/2011 10:29:20  T:  04/12/2011 23:16:26  Job #:  [id number]

## 2011-08-02 ENCOUNTER — Ambulatory Visit: Payer: BC Managed Care – PPO | Admitting: Physical Medicine & Rehabilitation

## 2011-08-14 ENCOUNTER — Ambulatory Visit: Payer: Self-pay | Admitting: Obstetrics and Gynecology

## 2011-08-30 ENCOUNTER — Encounter
Payer: BC Managed Care – PPO | Attending: Physical Medicine & Rehabilitation | Admitting: Physical Medicine & Rehabilitation

## 2011-08-30 DIAGNOSIS — S59909A Unspecified injury of unspecified elbow, initial encounter: Secondary | ICD-10-CM | POA: Insufficient documentation

## 2011-08-30 DIAGNOSIS — M7710 Lateral epicondylitis, unspecified elbow: Secondary | ICD-10-CM

## 2011-08-30 DIAGNOSIS — M47812 Spondylosis without myelopathy or radiculopathy, cervical region: Secondary | ICD-10-CM

## 2011-08-30 DIAGNOSIS — IMO0002 Reserved for concepts with insufficient information to code with codable children: Secondary | ICD-10-CM

## 2011-08-30 DIAGNOSIS — M47817 Spondylosis without myelopathy or radiculopathy, lumbosacral region: Secondary | ICD-10-CM | POA: Insufficient documentation

## 2011-08-30 DIAGNOSIS — Z87442 Personal history of urinary calculi: Secondary | ICD-10-CM | POA: Insufficient documentation

## 2011-08-30 DIAGNOSIS — G890 Central pain syndrome: Secondary | ICD-10-CM | POA: Insufficient documentation

## 2011-08-30 DIAGNOSIS — S6990XA Unspecified injury of unspecified wrist, hand and finger(s), initial encounter: Secondary | ICD-10-CM | POA: Insufficient documentation

## 2011-08-30 DIAGNOSIS — M76899 Other specified enthesopathies of unspecified lower limb, excluding foot: Secondary | ICD-10-CM | POA: Insufficient documentation

## 2011-08-30 DIAGNOSIS — X58XXXA Exposure to other specified factors, initial encounter: Secondary | ICD-10-CM | POA: Insufficient documentation

## 2011-08-30 DIAGNOSIS — M1710 Unilateral primary osteoarthritis, unspecified knee: Secondary | ICD-10-CM | POA: Insufficient documentation

## 2011-08-30 NOTE — Assessment & Plan Note (Signed)
HISTORY:  Kathryn Farrell is back regarding her chronic pain syndrome.  She has generally been doing quite well.  She is married since I last saw her. Unfortunately, she has had some problems recently with kidney stones, and receiving treatment for these, but had some hematuria and discomfort in her inguinal pelvic regions.  She has corresponding hip pain at times as well, although has had hip pain in the past.  Her pain overall is 7/10.  Described as sharp, stabbing, aching.  Sleep is fair.  She does well with her Keppra at night for sleep and generalized pain as well as the Xanax to help relax.  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  REVIEW OF SYSTEMS:  Notable for occasional nausea, dysuria, another items as mentioned above.  Full 12-point review is in the written health and history section of the chart.  PHYSICAL EXAMINATION:  VITAL SIGNS:  Blood pressure is 98/57, pulse 59, respiratory rate 16, and she is satting 95% on room air. GENERAL: The patient is pleasant and alert.  She has some pain at the greater trochanters.  She did not walk with any obvious antalgia.  She did have good standing and sitting posture.  Weight is stable.  She is alert and pleasant. HEART:  Regular. CHEST:  Clear. ABDOMEN:  Soft and nontender.  She had some tender areas on the right the elbow as well as the neck, but these are mild in general.  ASSESSMENT: 1. Centralized pain syndrome. 2. Lumbar spondylosis. 3. Bilateral greater trochanter bursitis. 4. Osteoarthritis, right knee. 5. Right forearm extensor mechanism injury. 6. Recent kidney stones.  PLAN: 1. I believe some of her pelvic pain and general pain should decrease     a bit once her stones are downwards. 2. We will stay with Keppra for now as it is helping with sleep and     some of her generalized pain complaints. 3. Hydrocodone.  We will  continue to use for breakthrough pain.     Generally she is using 2 or 3 days a day.  She is on 7.5/325 dose.     She did not need refills today. 4. I will see her back here in about 4 months.  She will call with any     problems or questions.     Ranelle Oyster, M.D. Electronically Signed    ZTS/MedQ D:  08/30/2011 11:48:52  T:  08/30/2011 13:51:10  Job #:  [id number]

## 2011-11-04 ENCOUNTER — Ambulatory Visit: Payer: Self-pay | Admitting: General Surgery

## 2011-11-06 ENCOUNTER — Ambulatory Visit: Payer: Self-pay | Admitting: General Surgery

## 2011-11-08 ENCOUNTER — Ambulatory Visit: Payer: Self-pay | Admitting: General Surgery

## 2011-11-11 LAB — PATHOLOGY REPORT

## 2011-12-24 ENCOUNTER — Ambulatory Visit: Payer: BC Managed Care – PPO | Admitting: Physical Medicine & Rehabilitation

## 2011-12-27 ENCOUNTER — Telehealth: Payer: Self-pay | Admitting: Physical Medicine & Rehabilitation

## 2011-12-27 MED ORDER — HYDROCODONE-ACETAMINOPHEN 7.5-325 MG PO TABS: 1.0000 | ORAL_TABLET | Freq: Four times a day (QID) | ORAL | Status: DC | PRN

## 2011-12-27 NOTE — Telephone Encounter (Signed)
Needs Norco refill.

## 2011-12-27 NOTE — Telephone Encounter (Signed)
Rx has been called in. Pt aware. 

## 2012-01-17 ENCOUNTER — Encounter
Payer: BC Managed Care – PPO | Attending: Physical Medicine & Rehabilitation | Admitting: Physical Medicine & Rehabilitation

## 2012-01-17 ENCOUNTER — Encounter: Payer: Self-pay | Admitting: Physical Medicine & Rehabilitation

## 2012-01-17 VITALS — BP 104/66 | HR 81 | Resp 16 | Ht 60.0 in | Wt 130.6 lb

## 2012-01-17 DIAGNOSIS — M76899 Other specified enthesopathies of unspecified lower limb, excluding foot: Secondary | ICD-10-CM | POA: Insufficient documentation

## 2012-01-17 DIAGNOSIS — IMO0002 Reserved for concepts with insufficient information to code with codable children: Secondary | ICD-10-CM | POA: Insufficient documentation

## 2012-01-17 DIAGNOSIS — Z87442 Personal history of urinary calculi: Secondary | ICD-10-CM | POA: Insufficient documentation

## 2012-01-17 DIAGNOSIS — M7060 Trochanteric bursitis, unspecified hip: Secondary | ICD-10-CM | POA: Insufficient documentation

## 2012-01-17 DIAGNOSIS — S72109A Unspecified trochanteric fracture of unspecified femur, initial encounter for closed fracture: Secondary | ICD-10-CM

## 2012-01-17 DIAGNOSIS — M1710 Unilateral primary osteoarthritis, unspecified knee: Secondary | ICD-10-CM | POA: Insufficient documentation

## 2012-01-17 DIAGNOSIS — M47816 Spondylosis without myelopathy or radiculopathy, lumbar region: Secondary | ICD-10-CM

## 2012-01-17 DIAGNOSIS — M47817 Spondylosis without myelopathy or radiculopathy, lumbosacral region: Secondary | ICD-10-CM | POA: Insufficient documentation

## 2012-01-17 DIAGNOSIS — G890 Central pain syndrome: Secondary | ICD-10-CM | POA: Insufficient documentation

## 2012-01-17 DIAGNOSIS — X58XXXS Exposure to other specified factors, sequela: Secondary | ICD-10-CM | POA: Insufficient documentation

## 2012-01-17 DIAGNOSIS — S72113A Displaced fracture of greater trochanter of unspecified femur, initial encounter for closed fracture: Secondary | ICD-10-CM

## 2012-01-17 MED ORDER — DICLOFENAC SODIUM 1 % TD GEL: 1.0000 "application " | Freq: Three times a day (TID) | TRANSDERMAL | Status: DC

## 2012-01-17 NOTE — Patient Instructions (Signed)

## 2012-01-17 NOTE — Progress Notes (Signed)
Subjective:    Patient ID: Kathryn Farrell, female    DOB: 1961-06-20, 51 y.o.   MRN: [id number]  HPI Summerlynn is back regarding her chronic pain. She passed her stone and felt much better. She had some nausea which a ginger root supplement has helped.  Her hips are bothering her still as well. Pain is usually along the outer half and into the right inguinal area. She feels that it's associated with exercise and increased activity. Sleeping on her side also exacerbates the pain.  She found that the voltaren gel was very helpful but insurance isn't approving. She reports mild weight gain recently.  Patient continues to work full time and is happily married.  Pain Inventory Average Pain 6 Pain Right Now 5 My pain is constant, sharp and aching  In the last 24 hours, has pain interfered with the following? General activity 7 Relation with others 6 Enjoyment of life 6 What TIME of day is your pain at its worst? morning Sleep (in general) Good  Pain is worse with: walking, bending, sitting and inactivity Pain improves with: rest, medication, TENS and injections Relief from Meds: 8  Mobility walk without assistance ability to climb steps?  yes do you drive?  yes  Function employed # of hrs/week 40 hr  Neuro/Psych No problems in this area  Prior Studies Any changes since last visit?  no  Physicians involved in your care Any changes since last visit?  no Primary care Dr Marcello Fennel @ [facility]      Review of Systems  Gastrointestinal: Positive for nausea.  All other systems reviewed and are negative.       Objective:   Physical Exam  Constitutional: She is oriented to person, place, and time. She appears well-developed.  Eyes: Conjunctivae and EOM are normal. Pupils are equal, round, and reactive to light.  Neck: Normal range of motion.  Cardiovascular: Normal rate and regular rhythm.   Pulmonary/Chest: Effort normal.  Abdominal: Soft.  Neurological: She is alert  and oriented to person, place, and time.   Patient's weight is stable. She ambulates without any obvious antalgia. She transfers easily. She is able to bend and touch her toes with minimal discomfort. She extended to about 20 at the lumbar spine without pain. She had some tenderness with palpation over the greater trochanters on either leg right more than left. She had some pain with cross leg maneuvers. Minimal pain was palpated over the lumbar paraspinals and into the PSIS areas. Motor exam was 5 out of 5 in either extremity. Sensory exam is intact. Reflexes are 2+. She does have some mild crepitus at the right knee. No knee instability is appreciated. No significant edema is seen at the right knee.       Assessment & Plan:  ASSESSMENT:  1. Centralized pain syndrome.  2. Lumbar spondylosis.  3. Bilateral greater trochanter bursitis.  4. Osteoarthritis, right knee.  5. Right forearm extensor mechanism injury.  6. Recent kidney stones.   PLAN:  1. Discussed maintenance techniques and exercises, modalities for hips. Exercises were provided to the patient today for her hips. 2. We will stay with Keppra for now as it is helping with sleep and  some of her generalized pain complaints.  3. Hydrocodone. We will continue to use for breakthrough pain.  Generally she is using 2 or 3 days a day. She is on 7.5/325 dose.  She did not need refills today.  4. Will refill voltaren gel and try to  get it approved thru insurance. 5.I will see her back here in about 4 months. She will call with any  problems or questions.

## 2012-01-20 ENCOUNTER — Encounter: Payer: Self-pay | Admitting: *Deleted

## 2012-01-20 NOTE — Progress Notes (Signed)
Prior Authorization initiated for Voltaren Gel thru [employer].

## 2012-01-22 ENCOUNTER — Other Ambulatory Visit: Payer: Self-pay | Admitting: Physical Medicine & Rehabilitation

## 2012-01-22 NOTE — Telephone Encounter (Signed)
Refills must be under [employer]", not Cordelia Pen "Andrey Campanile"

## 2012-01-23 NOTE — Telephone Encounter (Signed)
Patient not requesting refill, only asking to make sure future refills are under "Earlene Plater, not "[employer]

## 2012-01-27 ENCOUNTER — Other Ambulatory Visit: Payer: Self-pay | Admitting: *Deleted

## 2012-01-27 MED ORDER — HYDROCODONE-ACETAMINOPHEN 7.5-325 MG PO TABS: 1.0000 | ORAL_TABLET | Freq: Four times a day (QID) | ORAL | Status: DC | PRN

## 2012-01-27 NOTE — Telephone Encounter (Signed)
Notified that hydrocodone Rx has been refilled.

## 2012-01-29 ENCOUNTER — Encounter: Payer: Self-pay | Admitting: *Deleted

## 2012-01-29 NOTE — Progress Notes (Signed)
What is it covered for.  i included about every major extremity joint on the script!  ZTS  [employer] denied coverage for Voltaren Gel due to "not for treatment of OA of elbow, wrist, hand, knee, ankle, and/or foot"   FYI

## 2012-02-24 ENCOUNTER — Other Ambulatory Visit: Payer: Self-pay | Admitting: Physical Medicine & Rehabilitation

## 2012-03-23 ENCOUNTER — Other Ambulatory Visit: Payer: Self-pay | Admitting: Physical Medicine & Rehabilitation

## 2012-03-24 ENCOUNTER — Other Ambulatory Visit: Payer: Self-pay | Admitting: Physical Medicine & Rehabilitation

## 2012-04-21 ENCOUNTER — Other Ambulatory Visit: Payer: Self-pay | Admitting: Physical Medicine & Rehabilitation

## 2012-05-20 ENCOUNTER — Telehealth: Payer: Self-pay

## 2012-05-20 ENCOUNTER — Encounter: Payer: Self-pay | Admitting: Physical Medicine & Rehabilitation

## 2012-05-20 ENCOUNTER — Encounter
Payer: BC Managed Care – PPO | Attending: Physical Medicine & Rehabilitation | Admitting: Physical Medicine & Rehabilitation

## 2012-05-20 VITALS — BP 116/80 | HR 80 | Resp 16 | Ht 60.0 in | Wt 121.4 lb

## 2012-05-20 DIAGNOSIS — M47816 Spondylosis without myelopathy or radiculopathy, lumbar region: Secondary | ICD-10-CM

## 2012-05-20 DIAGNOSIS — M5412 Radiculopathy, cervical region: Secondary | ICD-10-CM

## 2012-05-20 DIAGNOSIS — M7060 Trochanteric bursitis, unspecified hip: Secondary | ICD-10-CM

## 2012-05-20 DIAGNOSIS — M961 Postlaminectomy syndrome, not elsewhere classified: Secondary | ICD-10-CM

## 2012-05-20 DIAGNOSIS — IMO0002 Reserved for concepts with insufficient information to code with codable children: Secondary | ICD-10-CM

## 2012-05-20 DIAGNOSIS — G890 Central pain syndrome: Secondary | ICD-10-CM

## 2012-05-20 DIAGNOSIS — M545 Low back pain, unspecified: Secondary | ICD-10-CM | POA: Insufficient documentation

## 2012-05-20 DIAGNOSIS — M810 Age-related osteoporosis without current pathological fracture: Secondary | ICD-10-CM | POA: Insufficient documentation

## 2012-05-20 DIAGNOSIS — X58XXXS Exposure to other specified factors, sequela: Secondary | ICD-10-CM | POA: Insufficient documentation

## 2012-05-20 DIAGNOSIS — K219 Gastro-esophageal reflux disease without esophagitis: Secondary | ICD-10-CM | POA: Insufficient documentation

## 2012-05-20 DIAGNOSIS — M1710 Unilateral primary osteoarthritis, unspecified knee: Secondary | ICD-10-CM | POA: Insufficient documentation

## 2012-05-20 DIAGNOSIS — M47817 Spondylosis without myelopathy or radiculopathy, lumbosacral region: Secondary | ICD-10-CM

## 2012-05-20 DIAGNOSIS — Z981 Arthrodesis status: Secondary | ICD-10-CM | POA: Insufficient documentation

## 2012-05-20 DIAGNOSIS — M76899 Other specified enthesopathies of unspecified lower limb, excluding foot: Secondary | ICD-10-CM

## 2012-05-20 DIAGNOSIS — M542 Cervicalgia: Secondary | ICD-10-CM | POA: Insufficient documentation

## 2012-05-20 DIAGNOSIS — M546 Pain in thoracic spine: Secondary | ICD-10-CM | POA: Insufficient documentation

## 2012-05-20 DIAGNOSIS — R51 Headache: Secondary | ICD-10-CM | POA: Insufficient documentation

## 2012-05-20 MED ORDER — HYDROCODONE-ACETAMINOPHEN 7.5-325 MG PO TABS: 1.0000 | ORAL_TABLET | Freq: Four times a day (QID) | ORAL | Status: DC | PRN

## 2012-05-20 MED ORDER — METHOCARBAMOL 500 MG PO TABS: 500.0000 mg | ORAL_TABLET | Freq: Four times a day (QID) | ORAL | Status: AC | PRN

## 2012-05-20 NOTE — Progress Notes (Signed)
Subjective:    Patient ID: Kathryn Farrell, female    DOB: 07-07-1961, 51 y.o.   MRN: [id number]  HPI  [name] is back regarding her chronic pain issues. She has complaints of increased neck pain. It's been intermittently a problem over the last couple months. Over the last few weeks it has been more consistent. She had a cervical fusion in 2007. She has pain radiating into both upper arms and into the left hand.   She complains of mid back pain (between scapulae) and low back pain. The low back has been bothering her at work if she sits for a long period of time. It will also hurt with prolonged walking or standing. There is no radiation of her low back pain.   She denies any new exacerbating factors although she has been busy with work and family. She reports no precipitating incident.   Otherwise her right arm and her left knee has been stable. Left knee still "grinds"  She has been using 4 hydroocodne per day for pain control.  Pain Inventory Average Pain 8 Pain Right Now 8 My pain is constant, sharp, stabbing, tingling and aching  In the last 24 hours, has pain interfered with the following? General activity 7 Relation with others 7 Enjoyment of life 7 What TIME of day is your pain at its worst? all of the time Sleep (in general) Fair  Pain is worse with: walking, bending, sitting, standing and some activites Pain improves with: rest, heat/ice, pacing activities and medication Relief from Meds: 5  Mobility walk without assistance do you drive?  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  no  Physicians involved in your care Any changes since last visit?  no   Family History  Problem Relation Age of Onset  . COPD Mother   . Asthma Mother   . Cancer Father     esophageal cander with mets  . Diabetes Brother   . Stroke Brother    History   Social History  . Marital  Status: Married    Spouse Name: N/A    Number of Children: N/A  . Years of Education: N/A   Social History Main Topics  . Smoking status: Former Smoker    Quit date: 10/28/1996  . Smokeless tobacco: Never Used  . Alcohol Use: None  . Drug Use: None  . Sexually Active: None   Other Topics Concern  . None   Social History Narrative  . None   Past Surgical History  Procedure Date  . Abdominal hysterectomy 1989  . Arm surgery     "muscle came off the elbow"  . Cesarean section   . Spine surgery 2008    cervical fusion  . Knee arthroscopy 2004/2008    x2   Past Medical History  Diagnosis Date  . GERD (gastroesophageal reflux disease)   . Neuromuscular disorder     centralized pain syndrome  . Osteoporosis    BP 116/80  Pulse 80  Resp 16  Ht 5' (1.524 m)  Wt 121 lb 6.4 oz (55.067 kg)  BMI 23.71 kg/m2  SpO2 91%    Review of Systems  Musculoskeletal: Positive for back pain and arthralgias.       Neck pain and spasms  All other systems reviewed and are negative.       Objective:   Physical Exam Constitutional: She is oriented  to person, place, and time. She appears well-developed.  Eyes: Conjunctivae and EOM are normal. Pupils are equal, round, and reactive to light.  Neck: Normal range of motion.  Cardiovascular: Normal rate and regular rhythm.  Pulmonary/Chest: Effort normal.  Abdominal: Soft.  Neurological: She is alert and oriented to person, place, and time. dtr's are 1-2 +. Right bicep may have been slightly decreased. Strength in all 4 limbs intact. Sensory exam was unremarkable although she was complaining of tingling at the 4th and 5th fingers of the left hand.Spurling's maneuver was positve on the right and negative on the left. Patient had more pain in general with extension. Mild pain in the neck was noted with palpation of the paraspinals. No spasm was appreciated. Positive Tinel's sign at the right ulnar groove. Thoracic spine was notable for spasm  along the paraspinal-rhomboid area. She had tenderness with palpation of the spinous processes at that level. Low back was slightly tender to touch. No spasm was appreciated. She was able to bend and touch toes. Extension caused the most pain, facet maneuvers to the right provoked pain as well. Posture overall was fair.  Patient's weight is stable. She ambulates without any obvious antalgia. She transfers easily.  She does have some mild crepitus at the right knee. No knee instability is appreciated. No significant edema is seen at the right knee.    Assessment & Plan:   ASSESSMENT:  1. Centralized pain syndrome.  2. Lumbar spondylosis.  3. Bilateral greater trochanter bursitis.  4. Osteoarthritis, right knee.  5. Right forearm extensor mechanism injury.  6. Hx of cervical fusion C4-6. Now with increasing cervicalgia and headaches with radiating arm pain. Cannot rule out C8 radic although some of pain may be from ulnar nerve at the elbow  PLAN:  1. Ordered a CT myelogram of the C-Spine to rule out foraminal stenosis.  2. Ordered XR of the lumbar spine. Consider Facet Blocks 3. Hydrocodone. We will continue to use for breakthrough pain.  Generally she is using 2 or 3 days a day. She is on 7.5/325 dose.  4. Discussed appropriate posture and technique, therapy may be an option, too pending tests and interventions 5. RTC in one month.

## 2012-05-20 NOTE — Patient Instructions (Signed)
USE APPROPRIATE POSTURE AND TECHNIQUE WITH YOUR EVERYDAY ACTIVITIES

## 2012-05-20 NOTE — Telephone Encounter (Signed)
Robaxin 500mg  1 q6 prn #45  1rf

## 2012-05-20 NOTE — Telephone Encounter (Signed)
Pt aware of medication.

## 2012-05-20 NOTE — Telephone Encounter (Signed)
Pt would like something for muscle spasms and forgot to ask at appt.  Please advise.

## 2012-05-22 ENCOUNTER — Ambulatory Visit: Payer: BC Managed Care – PPO | Admitting: Physical Medicine & Rehabilitation

## 2012-05-25 ENCOUNTER — Telehealth: Payer: Self-pay | Admitting: Physical Medicine & Rehabilitation

## 2012-05-25 NOTE — Telephone Encounter (Signed)
Angel at [facility] states they do not do CT's with contrast and wants to know if they can do without.  Patient requesting [facility], but will do [facility] if neccessary.  Thanks.

## 2012-05-26 NOTE — Telephone Encounter (Signed)
No, i want a ct-myelogram.  There is no way on epic to order a "ct-myelogram", only a CT with contrast came up when i typed in myelogram.

## 2012-05-28 ENCOUNTER — Other Ambulatory Visit: Payer: Self-pay | Admitting: Physical Medicine & Rehabilitation

## 2012-05-28 DIAGNOSIS — M542 Cervicalgia: Secondary | ICD-10-CM

## 2012-05-28 DIAGNOSIS — M549 Dorsalgia, unspecified: Secondary | ICD-10-CM

## 2012-05-29 ENCOUNTER — Other Ambulatory Visit: Payer: Self-pay | Admitting: Physical Medicine & Rehabilitation

## 2012-05-29 DIAGNOSIS — M545 Low back pain: Secondary | ICD-10-CM

## 2012-05-29 NOTE — Telephone Encounter (Signed)
Done, patient to schedule at [facility].

## 2012-06-16 ENCOUNTER — Other Ambulatory Visit: Payer: Self-pay | Admitting: Physical Medicine & Rehabilitation

## 2012-06-19 ENCOUNTER — Ambulatory Visit
Admission: RE | Admit: 2012-06-19 | Discharge: 2012-06-19 | Disposition: A | Discharge status: Home / Self Care | Payer: BC Managed Care – PPO | Source: Ambulatory Visit | Attending: Physical Medicine & Rehabilitation | Admitting: Physical Medicine & Rehabilitation

## 2012-06-19 ENCOUNTER — Other Ambulatory Visit: Payer: BC Managed Care – PPO

## 2012-06-19 ENCOUNTER — Other Ambulatory Visit: Payer: Self-pay | Admitting: Physical Medicine & Rehabilitation

## 2012-06-19 VITALS — BP 99/58 | HR 61 | Ht 60.0 in | Wt 120.0 lb

## 2012-06-19 DIAGNOSIS — M961 Postlaminectomy syndrome, not elsewhere classified: Secondary | ICD-10-CM

## 2012-06-19 DIAGNOSIS — M7060 Trochanteric bursitis, unspecified hip: Secondary | ICD-10-CM

## 2012-06-19 DIAGNOSIS — M5412 Radiculopathy, cervical region: Secondary | ICD-10-CM

## 2012-06-19 DIAGNOSIS — M47816 Spondylosis without myelopathy or radiculopathy, lumbar region: Secondary | ICD-10-CM

## 2012-06-19 DIAGNOSIS — G890 Central pain syndrome: Secondary | ICD-10-CM

## 2012-06-19 DIAGNOSIS — M1710 Unilateral primary osteoarthritis, unspecified knee: Secondary | ICD-10-CM

## 2012-06-19 DIAGNOSIS — M549 Dorsalgia, unspecified: Secondary | ICD-10-CM

## 2012-06-19 DIAGNOSIS — M542 Cervicalgia: Secondary | ICD-10-CM

## 2012-06-19 DIAGNOSIS — M545 Low back pain: Secondary | ICD-10-CM

## 2012-06-19 MED ORDER — DIAZEPAM 5 MG PO TABS
10.0000 mg | ORAL_TABLET | Freq: Once | ORAL | Status: AC
  Administered 2012-06-19: 10 mg via ORAL

## 2012-06-19 MED ORDER — OXYCODONE-ACETAMINOPHEN 5-325 MG PO TABS
1.0000 | ORAL_TABLET | Freq: Once | ORAL | Status: AC
  Administered 2012-06-19: 1 via ORAL

## 2012-06-19 MED ORDER — IOHEXOL 300 MG/ML  SOLN
10.0000 mL | Freq: Once | INTRAMUSCULAR | Status: AC | PRN
  Administered 2012-06-19: 10 mL via INTRATHECAL

## 2012-06-19 NOTE — Progress Notes (Signed)
Patient states she has been off Apidex for the past two days.  Larina Earthly, RN

## 2012-06-21 ENCOUNTER — Telehealth: Payer: Self-pay | Admitting: Physical Medicine & Rehabilitation

## 2012-06-21 ENCOUNTER — Ambulatory Visit (HOSPITAL_COMMUNITY)
Admission: EM | Admit: 2012-06-21 | Discharge: 2012-06-21 | Disposition: A | Discharge status: Home / Self Care | Payer: BC Managed Care – PPO | Source: Ambulatory Visit | Attending: Radiology | Admitting: Radiology

## 2012-06-21 ENCOUNTER — Ambulatory Visit (HOSPITAL_COMMUNITY)
Admission: RE | Admit: 2012-06-21 | Discharge: 2012-06-21 | Disposition: A | Discharge status: Home / Self Care | Payer: BC Managed Care – PPO | Source: Ambulatory Visit | Attending: Radiology | Admitting: Radiology

## 2012-06-21 ENCOUNTER — Encounter (HOSPITAL_COMMUNITY): Payer: Self-pay | Admitting: *Deleted

## 2012-06-21 ENCOUNTER — Other Ambulatory Visit (HOSPITAL_COMMUNITY): Payer: Self-pay | Admitting: Radiology

## 2012-06-21 ENCOUNTER — Other Ambulatory Visit: Payer: Self-pay | Admitting: Radiology

## 2012-06-21 DIAGNOSIS — M5412 Radiculopathy, cervical region: Secondary | ICD-10-CM

## 2012-06-21 DIAGNOSIS — M961 Postlaminectomy syndrome, not elsewhere classified: Secondary | ICD-10-CM

## 2012-06-21 DIAGNOSIS — G971 Other reaction to spinal and lumbar puncture: Secondary | ICD-10-CM | POA: Insufficient documentation

## 2012-06-21 DIAGNOSIS — M47812 Spondylosis without myelopathy or radiculopathy, cervical region: Secondary | ICD-10-CM

## 2012-06-21 DIAGNOSIS — M47817 Spondylosis without myelopathy or radiculopathy, lumbosacral region: Secondary | ICD-10-CM | POA: Insufficient documentation

## 2012-06-21 DIAGNOSIS — M47816 Spondylosis without myelopathy or radiculopathy, lumbar region: Secondary | ICD-10-CM

## 2012-06-21 MED ORDER — IOHEXOL 180 MG/ML  SOLN
20.0000 mL | Freq: Once | INTRAMUSCULAR | Status: AC | PRN
  Administered 2012-06-21: 2 mL via EPIDURAL

## 2012-06-21 NOTE — Telephone Encounter (Signed)
Blood patch ordered to be done at [facility] imaging

## 2012-06-21 NOTE — ED Notes (Signed)
To ED for treatment of HA and nausea since myelogram on Friday. Told to come to ED by radiologist. Radiology called and RN will investigate.  Pt is alert and oriented.

## 2012-06-21 NOTE — Procedures (Signed)
Post-myelogram headache.  Blood patch L3-4.  See formal dictation.

## 2012-06-23 ENCOUNTER — Encounter: Payer: Self-pay | Admitting: Physical Medicine & Rehabilitation

## 2012-06-23 ENCOUNTER — Encounter
Payer: BC Managed Care – PPO | Attending: Physical Medicine & Rehabilitation | Admitting: Physical Medicine & Rehabilitation

## 2012-06-23 VITALS — BP 125/68 | HR 118 | Resp 14 | Ht 60.0 in | Wt 125.0 lb

## 2012-06-23 DIAGNOSIS — X58XXXS Exposure to other specified factors, sequela: Secondary | ICD-10-CM | POA: Insufficient documentation

## 2012-06-23 DIAGNOSIS — IMO0002 Reserved for concepts with insufficient information to code with codable children: Secondary | ICD-10-CM | POA: Insufficient documentation

## 2012-06-23 DIAGNOSIS — M1710 Unilateral primary osteoarthritis, unspecified knee: Secondary | ICD-10-CM

## 2012-06-23 DIAGNOSIS — M961 Postlaminectomy syndrome, not elsewhere classified: Secondary | ICD-10-CM | POA: Insufficient documentation

## 2012-06-23 DIAGNOSIS — M47812 Spondylosis without myelopathy or radiculopathy, cervical region: Secondary | ICD-10-CM | POA: Insufficient documentation

## 2012-06-23 MED ORDER — KETOROLAC TROMETHAMINE 60 MG/2ML IM SOLN
60.0000 mg | Freq: Once | INTRAMUSCULAR | Status: AC
  Administered 2012-06-23: 60 mg via INTRAMUSCULAR

## 2012-06-23 MED ORDER — HYDROCODONE-ACETAMINOPHEN 7.5-325 MG PO TABS: 1.0000 | ORAL_TABLET | Freq: Four times a day (QID) | ORAL | Status: DC | PRN

## 2012-06-23 NOTE — Patient Instructions (Signed)
Call me with any questions 

## 2012-06-23 NOTE — Progress Notes (Signed)
Subjective:    Patient ID: Kathryn Farrell, female    DOB: 04-10-1961, 51 y.o.   MRN: [id number]  HPI  Kathryn Farrell is back regarding her chronic pain complaints. She went for a CT myelogram at [facility] on Friday which ultimately caused a severe spinal headache. After the patient called me Sunday morning with a persistent, severe headache, I contacted Dr. Benard Rink who was ultimately able to do a blood patch on Sunday which relieved her pain.   She comes in to the office today with a sore left ankle after falling when her ankle turned after she had stepped on a rock. She suffered an abrasion to her left knee as well.  From a standpoint of her neck pain, she still complains of lower cervical spine pain which radiates into the right shoulder blade. She has no radiation into either arms. She does complain of persistent tingling in the 3rd, 4th, and 5th digits of the left hand. 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 She also tends to sleep on her right side.   I reviewed her myelogram today which revealed:  1. Status post ACDF at C4-5 and C5-6.  2. Adjacent level disc osteophyte complex at C6-7 with potential  neural foraminal narrowing bilaterally.  3. No significant disc disease within the thoracic spine.  4. 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        Pain Inventory Average Pain 7 Pain Right Now 7 My pain is sharp, stabbing, tingling and aching  In the last 24 hours, has pain interfered with the following? General activity 7 Relation with others 7 Enjoyment of life 7 What TIME of day is your pain at its worst? morning Sleep (in general) Fair  Pain is worse with: walking, sitting, standing and some activites Pain improves with: rest, heat/ice, medication and injections Relief from Meds: 5  Mobility how many minutes can you walk? 1 mile ability to climb steps?  yes do you drive?  yes Do you have any goals in this  area?  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  no  Neuro/Psych tingling spasms  Prior Studies CT/MRI  Physicians involved in your care Any changes since last visit?  no   Family History  Problem Relation Age of Onset  . COPD Mother   . Asthma Mother   . Cancer Father     esophageal cander with mets  . Diabetes Brother   . Stroke Brother    History   Social History  . Marital Status: Married    Spouse Name: N/A    Number of Children: N/A  . Years of Education: N/A   Social History Main Topics  . Smoking status: Former Smoker    Quit date: 10/28/1996  . Smokeless tobacco: Never Used  . Alcohol Use: None  . Drug Use: None  . Sexually Active: None   Other Topics Concern  . None   Social History Narrative  . None   Past Surgical History  Procedure Date  . Abdominal hysterectomy 1989  . Arm surgery     "muscle came off the elbow"  . Cesarean section   . Spine surgery 2008    cervical fusion  . Knee arthroscopy 2004/2008    x2   Past Medical History  Diagnosis Date  . GERD (gastroesophageal reflux disease)   . Neuromuscular  disorder     centralized pain syndrome  . Osteoporosis    BP 125/68  Pulse 118  Resp 14  Ht 5' (1.524 m)  Wt 125 lb (56.7 kg)  BMI 24.41 kg/m2  SpO2 98%     Review of Systems  HENT: Positive for neck pain.   Gastrointestinal: Positive for nausea.  Musculoskeletal: Positive for myalgias, back pain and arthralgias.       Objective:   Physical Exam Constitutional: She is oriented to person, place, and time. She appears well-developed.  Eyes: Conjunctivae and EOM are normal. Pupils are equal, round, and reactive to light.  Neck: Normal range of motion.  Cardiovascular: Normal rate and regular rhythm.  Pulmonary/Chest: Effort normal.  Abdominal: Soft.  Neurological: She is alert and oriented to person, place, and time. dtr's are 1-2 +.  Strength in all 4 limbs  intact at 5/5. Sensory exam was unremarkable although she was complaining of tingling at the 4th and 5th fingers of the left hand.Spurling's maneuver was equivocal to negative on the right and negative on the left. Patient had more pain in general with extension. Mild pain in the neck was noted with palpation of the paraspinals. No spasm was appreciated. Positive Tinel's sign at the left ulnar groove. DTR's were all 2+. Thoracic spine was notable for spasm along the paraspinal-rhomboid area. She had tenderness with palpation of the spinous processes at that level. Low back was slightly tender to touch. No spasm was appreciated. She was able to bend and touch toes. Extension caused the most pain, facet maneuvers to the right provoked pain as well. Posture overall was fair.  Patient's weight is stable. She ambulates without any obvious antalgia. She transfers easily. She does have some mild crepitus at the right knee. No knee instability is appreciated. No significant edema is seen at the right knee. She had some mild swelling at the left ankle. She was still wearing her high heeled shoes.   Assessment & Plan:   ASSESSMENT:  1. Centralized pain syndrome.  2. Lumbar spondylosis.  3. Bilateral greater trochanter bursitis.  4. Osteoarthritis, right knee.  5. Right forearm extensor mechanism injury.  6. Hx of cervical fusion C4-6. Now with increasing cervicalgia and headaches with radiating arm pain. Cannot rule out C8 radic although some of pain may be from ulnar nerve at the elbow   PLAN:  1. Based on her CT findings, i favor local referral of pain from the C6-7 level as a cause of her neck and shoulder blade pain. I think the left hand is more of a peripheral issue related to the elbow. I ordered a C7-T1 ESI paracentral to the right at [facility]. I also offered a surgical referral for opinion, but she wanted to hold off on that for the time being. 2. Discussed the possibility of performing NCS, but she wanted  to wait on this as well. 3. Hydrocodone. We will continue to use for breakthrough pain.  Generally she is using 2 or 3 days a day. She is on 7.5/325 dose.  4. The patient was given a toradol injection 60mg  to help with current pain, although her headaches have improved greatly since the blood patch. I would like to thank Dr. Benard Rink for his assistance in this matter.  5. RTC in about 6 weeks. All questions were encouraged and answered.

## 2012-07-16 ENCOUNTER — Telehealth: Payer: Self-pay | Admitting: Physical Medicine & Rehabilitation

## 2012-07-16 NOTE — Telephone Encounter (Signed)
Going out of town for anniversary.. Having lot of neck and back pain.  Can Dr call in something?

## 2012-07-16 NOTE — Telephone Encounter (Signed)
Advised pt that she should come in and be seen. She agreed, has an appointment with Dr. Riley Kill on 07/20/12 @ 10am.

## 2012-07-20 ENCOUNTER — Encounter: Payer: Self-pay | Admitting: Physical Medicine & Rehabilitation

## 2012-07-20 ENCOUNTER — Encounter
Payer: BC Managed Care – PPO | Attending: Physical Medicine & Rehabilitation | Admitting: Physical Medicine & Rehabilitation

## 2012-07-20 VITALS — BP 123/74 | HR 90 | Resp 14 | Ht 60.0 in | Wt 127.0 lb

## 2012-07-20 DIAGNOSIS — M5412 Radiculopathy, cervical region: Secondary | ICD-10-CM | POA: Insufficient documentation

## 2012-07-20 DIAGNOSIS — M76899 Other specified enthesopathies of unspecified lower limb, excluding foot: Secondary | ICD-10-CM | POA: Insufficient documentation

## 2012-07-20 DIAGNOSIS — M7060 Trochanteric bursitis, unspecified hip: Secondary | ICD-10-CM

## 2012-07-20 DIAGNOSIS — X58XXXS Exposure to other specified factors, sequela: Secondary | ICD-10-CM | POA: Insufficient documentation

## 2012-07-20 DIAGNOSIS — M961 Postlaminectomy syndrome, not elsewhere classified: Secondary | ICD-10-CM

## 2012-07-20 DIAGNOSIS — M47812 Spondylosis without myelopathy or radiculopathy, cervical region: Secondary | ICD-10-CM

## 2012-07-20 DIAGNOSIS — M47816 Spondylosis without myelopathy or radiculopathy, lumbar region: Secondary | ICD-10-CM

## 2012-07-20 DIAGNOSIS — M1710 Unilateral primary osteoarthritis, unspecified knee: Secondary | ICD-10-CM | POA: Insufficient documentation

## 2012-07-20 DIAGNOSIS — IMO0002 Reserved for concepts with insufficient information to code with codable children: Secondary | ICD-10-CM | POA: Insufficient documentation

## 2012-07-20 DIAGNOSIS — M47817 Spondylosis without myelopathy or radiculopathy, lumbosacral region: Secondary | ICD-10-CM

## 2012-07-20 MED ORDER — FENTANYL 12 MCG/HR TD PT72: 1.0000 | MEDICATED_PATCH | TRANSDERMAL | Status: DC

## 2012-07-20 MED ORDER — HYDROCODONE-ACETAMINOPHEN 7.5-325 MG PO TABS: 1.0000 | ORAL_TABLET | Freq: Four times a day (QID) | ORAL | Status: DC | PRN

## 2012-07-20 NOTE — Progress Notes (Signed)
Subjective:    Patient ID: Kathryn Farrell, female    DOB: Dec 13, 1960, 51 y.o.   MRN: [id number]  HPI Kathryn Farrell is back regarding her pain. She didn't receive a call from [facility] and did not call us back to follow up. She has had more back pain since she fell a few weeks ago. She is needing more hydrocodone for breakthrough pain. It doesn't seem to be helping as much. Her neck continues to bother her with pain radiating to etiher shoulder. She occasionally has symptoms in the arms, but that pain is not consistent. Headaches are increased.   Pain Inventory Average Pain 9 Pain Right Now 8 My pain is sharp, burning, stabbing, tingling and aching  In the last 24 hours, has pain interfered with the following? General activity 8 Relation with others 8 Enjoyment of life 8 What TIME of day is your pain at its worst? all the time Sleep (in general) Fair  Pain is worse with: walking, sitting and standing Pain improves with: rest, heat/ice, medication and injections Relief from Meds: 7  Mobility how many minutes can you walk? 30 ability to climb steps?  yes do you drive?  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  no  Physicians involved in your care Any changes since last visit?  no   Family History  Problem Relation Age of Onset  . COPD Mother   . Asthma Mother   . Cancer Father     esophageal cander with mets  . Diabetes Brother   . Stroke Brother    History   Social History  . Marital Status: Married    Spouse Name: N/A    Number of Children: N/A  . Years of Education: N/A   Social History Main Topics  . Smoking status: Former Smoker    Quit date: 10/28/1996  . Smokeless tobacco: Never Used  . Alcohol Use: None  . Drug Use: None  . Sexually Active: None   Other Topics Concern  . None   Social History Narrative  . None   Past Surgical History  Procedure Date  .  Abdominal hysterectomy 1989  . Arm surgery     "muscle came off the elbow"  . Cesarean section   . Spine surgery 2008    cervical fusion  . Knee arthroscopy 2004/2008    x2   Past Medical History  Diagnosis Date  . GERD (gastroesophageal reflux disease)   . Neuromuscular disorder     centralized pain syndrome  . Osteoporosis    There were no vitals taken for this visit.     Review of Systems  Musculoskeletal: Positive for myalgias, back pain and arthralgias.  All other systems reviewed and are negative.       Objective:   Physical Exam Constitutional: She is oriented to person, place, and time. She appears well-developed.  Eyes: Conjunctivae and EOM are normal. Pupils are equal, round, and reactive to light.  Neck: Normal range of motion.  Cardiovascular: Normal rate and regular rhythm.  Pulmonary/Chest: Effort normal.  Abdominal: Soft.  Neurological: She is alert and oriented to person, place, and time. dtr's are 1-2 +. Strength in all 4 limbs intact at 5/5. Sensory exam was unremarkable although she was complaining of tingling at the 4th and 5th fingers of the left hand.Spurling's maneuver was equivocal to negative on the right and negative on the left.  Patient had more pain in general with extension. Mild pain in the neck was noted with palpation of the paraspinals. No spasm was appreciated. Positive Tinel's sign at the left ulnar groove. DTR's were all 2+. Thoracic spine was notable for spasm along the paraspinal-rhomboid area. She had tenderness with palpation of the spinous processes at that level. Low back was slightly tender to touch. No spasm was appreciated. She was able to bend and touch toes. Extension caused the most pain, facet maneuvers to the right provoked pain as well. Posture overall was fair.  Patient's weight is stable. She ambulates without any obvious antalgia. She transfers easily. She does have some mild crepitus at the right knee. No knee instability is  appreciated. No significant edema is seen at the right knee. She had some mild swelling at the left ankle. She was still wearing her high heeled shoes.  Assessment & Plan:   ASSESSMENT:  1. Centralized pain syndrome.  2. Lumbar spondylosis.  3. Bilateral greater trochanter bursitis.  4. Osteoarthritis, right knee.  5. Right forearm extensor mechanism injury.  6. Hx of cervical fusion C4-6. Now with increasing cervicalgia and headaches with radiating arm pain. Cannot rule out C8 radic although this seems to be more ulnar nerve related at elbow.   PLAN:  1. i continue to favor local referral of pain from the C6-7 level as a cause of her neck and shoulder blade pain. I think the left hand is more of a peripheral issue related to the elbow. I ordered a C7-T1 ESI paracentral to the right at [facility] once again.  I also offered a surgical referral for opinion, and a referral was made. 2. Discussed the possibility of performing NCS, but she wanted to wait on this as well.  3. Hydrocodone. Continue for now but attempt to use more for breakthrough as aopposed to a scheduled med. Discussed the principles of opioid hypersensitization as well. 4. Initiate fentanyl patch for base line pain in order to help decrease hydrocodone dosing.  5. RTC in about one month. All questions were encouraged and answered.

## 2012-07-20 NOTE — Patient Instructions (Signed)
Call me with questions 

## 2012-07-29 ENCOUNTER — Encounter: Payer: BC Managed Care – PPO | Admitting: Physical Medicine & Rehabilitation

## 2012-08-14 ENCOUNTER — Encounter: Payer: Self-pay | Admitting: Physical Medicine & Rehabilitation

## 2012-08-14 ENCOUNTER — Encounter
Payer: BC Managed Care – PPO | Attending: Physical Medicine & Rehabilitation | Admitting: Physical Medicine & Rehabilitation

## 2012-08-14 VITALS — BP 125/71 | HR 71 | Resp 14 | Ht 60.0 in | Wt 128.0 lb

## 2012-08-14 DIAGNOSIS — M47816 Spondylosis without myelopathy or radiculopathy, lumbar region: Secondary | ICD-10-CM

## 2012-08-14 DIAGNOSIS — M1710 Unilateral primary osteoarthritis, unspecified knee: Secondary | ICD-10-CM

## 2012-08-14 DIAGNOSIS — M47817 Spondylosis without myelopathy or radiculopathy, lumbosacral region: Secondary | ICD-10-CM | POA: Insufficient documentation

## 2012-08-14 DIAGNOSIS — M961 Postlaminectomy syndrome, not elsewhere classified: Secondary | ICD-10-CM | POA: Insufficient documentation

## 2012-08-14 DIAGNOSIS — X58XXXS Exposure to other specified factors, sequela: Secondary | ICD-10-CM | POA: Insufficient documentation

## 2012-08-14 DIAGNOSIS — E039 Hypothyroidism, unspecified: Secondary | ICD-10-CM | POA: Insufficient documentation

## 2012-08-14 DIAGNOSIS — M7060 Trochanteric bursitis, unspecified hip: Secondary | ICD-10-CM

## 2012-08-14 DIAGNOSIS — G890 Central pain syndrome: Secondary | ICD-10-CM

## 2012-08-14 DIAGNOSIS — M47812 Spondylosis without myelopathy or radiculopathy, cervical region: Secondary | ICD-10-CM

## 2012-08-14 DIAGNOSIS — IMO0002 Reserved for concepts with insufficient information to code with codable children: Secondary | ICD-10-CM | POA: Insufficient documentation

## 2012-08-14 DIAGNOSIS — M76899 Other specified enthesopathies of unspecified lower limb, excluding foot: Secondary | ICD-10-CM | POA: Insufficient documentation

## 2012-08-14 DIAGNOSIS — M5412 Radiculopathy, cervical region: Secondary | ICD-10-CM | POA: Insufficient documentation

## 2012-08-14 MED ORDER — FENTANYL 12 MCG/HR TD PT72: 1.0000 | MEDICATED_PATCH | TRANSDERMAL | Status: DC

## 2012-08-14 MED ORDER — HYDROCODONE-ACETAMINOPHEN 7.5-325 MG PO TABS: 1.0000 | ORAL_TABLET | Freq: Four times a day (QID) | ORAL | Status: DC | PRN

## 2012-08-14 NOTE — Patient Instructions (Signed)
Call me with questions 

## 2012-08-14 NOTE — Progress Notes (Signed)
Subjective:    Patient ID: Kathryn Farrell, female    DOB: [birth year], 51 y.o.   MRN: [id number]  HPI  Natika is back regarding her chronic pain. She did not ultimately go for her ESI. We did make a referral to [facility], but apparently she has an outstanding balance with them which she was unaware of. I initiated a fentanyl patch and this has worked quite well for her. She has been able to tolerate much more activity as a result.    Pain Inventory Average Pain 5 Pain Right Now 5 My pain is constant, sharp, burning, stabbing, tingling and aching  In the last 24 hours, has pain interfered with the following? General activity 6 Relation with others 6 Enjoyment of life 6 What TIME of day is your pain at its worst? all of the time Sleep (in general) Fair  Pain is worse with: walking, sitting, standing and some activites Pain improves with: heat/ice, medication and injections Relief from Meds: 7  Mobility walk without assistance  Function employed # of hrs/week 40  Neuro/Psych numbness spasms confusion  Prior Studies Any changes since last visit?  no  Physicians involved in your care Any changes since last visit?  no   Family History  Problem Relation Age of Onset  . COPD Mother   . Asthma Mother   . Cancer Father     esophageal cander with mets  . Diabetes Brother   . Stroke Brother    History   Social History  . Marital Status: Married    Spouse Name: N/A    Number of Children: N/A  . Years of Education: N/A   Social History Main Topics  . Smoking status: Former Smoker    Quit date: 10/28/1996  . Smokeless tobacco: Never Used  . Alcohol Use: None  . Drug Use: None  . Sexually Active: None   Other Topics Concern  . None   Social History Narrative  . None   Past Surgical History  Procedure Date  . Abdominal hysterectomy 1989  . Arm surgery     "muscle came off the elbow"  . Cesarean section   . Spine surgery 2008    cervical fusion  . Knee  arthroscopy 2004/2008    x2   Past Medical History  Diagnosis Date  . GERD (gastroesophageal reflux disease)   . Neuromuscular disorder     centralized pain syndrome  . Osteoporosis    BP 125/71  Pulse 71  Resp 14  Ht 5' (1.524 m)  Wt 128 lb (58.06 kg)  BMI 25.00 kg/m2  SpO2 97%    Review of Systems  Gastrointestinal: Positive for nausea.  Musculoskeletal: Positive for arthralgias.       Spasms  Neurological: Positive for weakness and numbness.  Psychiatric/Behavioral: Positive for confusion.       Objective:   Physical Exam  Constitutional: She is oriented to person, place, and time. She appears well-developed.  Eyes: Conjunctivae and EOM are normal. Pupils are equal, round, and reactive to light.  Neck: Normal range of motion.  Cardiovascular: Normal rate and regular rhythm.  Pulmonary/Chest: Effort normal.  Abdominal: Soft.  Neurological: She is alert and oriented to person, place, and time. dtr's are 1-2 +. Strength in all 4 limbs intact at 5/5. Sensory exam was unremarkable although she was complaining of tingling at the 4th and 5th fingers of the left hand.Spurling's maneuver was equivocal to negative on the right and negative on the left. Patient had  more pain in general with extension. Mild pain in the neck was noted with palpation of the paraspinals. No spasm was appreciated. Positive Tinel's sign at the left ulnar groove. DTR's were all 2+. Thoracic spine was notable for spasm along the paraspinal-rhomboid area. She had tenderness with palpation of the spinous processes at that level. Low back was slightly tender to touch. No spasm was appreciated. She was able to bend and touch toes. Extension caused the most pain, facet maneuvers to the right provoked pain as well. Posture overall was fair.  Patient's weight is stable. She ambulates without any obvious antalgia. She transfers easily. She does have some mild crepitus at the right knee. No knee instability is  appreciated. No significant edema is seen at the right knee.   Assessment & Plan:   ASSESSMENT:  1. Centralized pain syndrome.  2. Lumbar spondylosis.  3. Bilateral greater trochanter bursitis.  4. Osteoarthritis, right knee.  5. Right forearm extensor mechanism injury.  6. Hx of cervical fusion C4-6. Now with increasing cervicalgia and headaches with radiating arm pain. Cannot rule out C8 radic although this seems to be more ulnar nerve related at elbow.    PLAN:  1. I continue to favor local referral of pain from the C6-7 level as a cause of her neck and shoulder blade pain. I think the left hand is more of a peripheral issue related to the elbow. Consider a C7-T1 ESI paracentral to the right. She will be seeing neurosurgery in December.  2. NCS is still a possibility  3. Hydrocodone for breakthrough pain..  4. Continue fentanyl patch for base line pain. A second rx was provided. 5. RTC in about 2 months. She may see my PA at that time.All questions were encouraged and answered

## 2012-08-19 ENCOUNTER — Ambulatory Visit: Payer: Self-pay | Admitting: Unknown Physician Specialty

## 2012-08-26 ENCOUNTER — Other Ambulatory Visit: Payer: Self-pay | Admitting: Physical Medicine & Rehabilitation

## 2012-08-26 ENCOUNTER — Encounter (HOSPITAL_BASED_OUTPATIENT_CLINIC_OR_DEPARTMENT_OTHER): Payer: BC Managed Care – PPO | Admitting: Physical Medicine & Rehabilitation

## 2012-08-26 ENCOUNTER — Encounter: Payer: Self-pay | Admitting: Physical Medicine & Rehabilitation

## 2012-08-26 VITALS — BP 121/59 | HR 73 | Resp 14 | Ht 60.0 in | Wt 129.0 lb

## 2012-08-26 DIAGNOSIS — IMO0002 Reserved for concepts with insufficient information to code with codable children: Secondary | ICD-10-CM

## 2012-08-26 DIAGNOSIS — E039 Hypothyroidism, unspecified: Secondary | ICD-10-CM

## 2012-08-26 DIAGNOSIS — G890 Central pain syndrome: Secondary | ICD-10-CM

## 2012-08-26 DIAGNOSIS — M179 Osteoarthritis of knee, unspecified: Secondary | ICD-10-CM

## 2012-08-26 DIAGNOSIS — M76899 Other specified enthesopathies of unspecified lower limb, excluding foot: Secondary | ICD-10-CM

## 2012-08-26 DIAGNOSIS — M47816 Spondylosis without myelopathy or radiculopathy, lumbar region: Secondary | ICD-10-CM

## 2012-08-26 DIAGNOSIS — M47817 Spondylosis without myelopathy or radiculopathy, lumbosacral region: Secondary | ICD-10-CM

## 2012-08-26 DIAGNOSIS — M7060 Trochanteric bursitis, unspecified hip: Secondary | ICD-10-CM

## 2012-08-26 DIAGNOSIS — M47812 Spondylosis without myelopathy or radiculopathy, cervical region: Secondary | ICD-10-CM

## 2012-08-26 DIAGNOSIS — M5412 Radiculopathy, cervical region: Secondary | ICD-10-CM

## 2012-08-26 DIAGNOSIS — M961 Postlaminectomy syndrome, not elsewhere classified: Secondary | ICD-10-CM

## 2012-08-26 DIAGNOSIS — M1710 Unilateral primary osteoarthritis, unspecified knee: Secondary | ICD-10-CM

## 2012-08-26 MED ORDER — DULOXETINE HCL 30 MG PO CPEP: 30.0000 mg | ORAL_CAPSULE | Freq: Every day | ORAL | Status: DC

## 2012-08-26 NOTE — Progress Notes (Signed)
Subjective:    Patient ID: Kathryn Farrell, female    DOB: [birth year], 51 y.o.   MRN: [id number]  HPI  [name] is back as she has concerns about her overall pain picture. She does feel that she has FMS now based on other lab work and assessments which were negative for a rheumatological process.  She had extensive labwork which I reviewed which was negative. She feels that she has lost her energy, concentration, physical stamina, etc. She is depressed because of her pain. She feels that she aches all over. It makes her angry that she is not able to accomplish medically and physically the things she once could. Sleep remains a major problem    Pain Inventory Average Pain 8 Pain Right Now 8 My pain is sharp, burning, dull, stabbing, tingling and aching  In the last 24 hours, has pain interfered with the following? General activity 8 Relation with others 8 Enjoyment of life 8 What TIME of day is your pain at its worst? all the time Sleep (in general) Poor  Pain is worse with: walking, bending, sitting, standing and some activites Pain improves with: medication, TENS and injections Relief from Meds: 7  Mobility walk without assistance how many minutes can you walk? 10 ability to climb steps?  yes do you drive?  yes Do you have any goals in this area?  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  yes  Neuro/Psych weakness tingling dizziness confusion  Prior Studies Any changes since last visit?  no  Physicians involved in your care Any changes since last visit?  no   Family History  Problem Relation Age of Onset  . COPD Mother   . Asthma Mother   . Cancer Father     esophageal cander with mets  . Diabetes Brother   . Stroke Brother    History   Social History  . Marital Status: Married    Spouse Name: N/A    Number of Children: N/A  . Years of Education: N/A   Social History Main Topics  . Smoking status: Former Smoker   Quit date: 10/28/1996  . Smokeless tobacco: Never Used  . Alcohol Use: None  . Drug Use: None  . Sexually Active: None   Other Topics Concern  . None   Social History Narrative  . None   Past Surgical History  Procedure Date  . Abdominal hysterectomy 1989  . Arm surgery     "muscle came off the elbow"  . Cesarean section   . Spine surgery 2008    cervical fusion  . Knee arthroscopy 2004/2008    x2   Past Medical History  Diagnosis Date  . GERD (gastroesophageal reflux disease)   . Neuromuscular disorder     centralized pain syndrome  . Osteoporosis    BP 121/59  Pulse 73  Resp 14  Ht 5' (1.524 m)  Wt 129 lb (58.514 kg)  BMI 25.19 kg/m2  SpO2 96%    Review of Systems  Musculoskeletal: Positive for myalgias and arthralgias.  Neurological: Positive for dizziness and weakness.  Psychiatric/Behavioral: Positive for confusion.  All other systems reviewed and are negative.       Objective:   Physical Exam Constitutional: She is oriented to person, place, and time. She appears well-developed.  Eyes: Conjunctivae and EOM are normal. Pupils are equal, round, and reactive to light.  Neck: Normal range of motion.  Cardiovascular: Normal rate  and regular rhythm.  Pulmonary/Chest: Effort normal.  Abdominal: Soft.  Neurological: She is alert and oriented to person, place, and time. dtr's are 1-2 +. Strength in all 4 limbs intact at 5/5. Sensory exam was unremarkable although she was complaining of tingling at the 4th and 5th fingers of the left hand.Spurling's maneuver was equivocal to negative on the right and negative on the left. Patient had more pain in general with extension. Mild pain in the neck was noted with palpation of the paraspinals. No spasm was appreciated. Positive Tinel's sign at the left ulnar groove. DTR's were all 2+. Thoracic spine was notable for spasm along the paraspinal-rhomboid area. She had tenderness with palpation of the spinous processes at that  level. Low back was slightly tender to touch. No spasm was appreciated. She was able to bend and touch toes. Extension caused the most pain, facet maneuvers to the right provoked pain as well. Posture overall was fair.  Patient's weight is stable. She ambulates without any obvious antalgia. She transfers easily. She does have some mild crepitus at the right knee. No knee instability is appreciated. No significant edema is seen at the right knee.   Assessment & Plan:   ASSESSMENT:  1. Centralized pain syndrome/ FMS 2. Lumbar spondylosis.  3. Bilateral greater trochanter bursitis.  4. Osteoarthritis, right knee.  5. Right forearm extensor mechanism injury.  6. Hx of cervical fusion C4-6. Now with increasing cervicalgia and headaches with radiating arm pain. Cannot rule out C8 radic although this seems to be more ulnar nerve related at elbow.    PLAN:  1. We discussed the mechanisms of chronic pain, fibromyalgia syndrome. Certainly her presentation is at least due to a chronic pain syndrome and secondary depression. I do believe she meets a lot of the criteria for FMS as well.  2. Think she would benefit from a re-trial of cymbalta. I do believe her mood is a factor. I think it might be helpful to see a pain psychologist. 3. Hydrocodone for breakthrough pain. 4. Continue fentanyl patch for base line pain. A second rx was provided.  5. RTC in about 6 weeks at previously scheduled 6. Also ordered T3 and T4 levels as only TSH was ordered previously. Recent vit d was normal. 7. Advised a trial of DHEA 25mg  daily and melatonin at night. 8. Probably would be a good idea to take off of work for a period of 2 months so that she can focus on the issues and treatments above.

## 2012-08-26 NOTE — Patient Instructions (Signed)
SUPPLEMENTS TO TRY:  1. DHEA---TAKE 25MG  DAILY FOR ENERGY, PAIN  2. MELATONIN---3-5MG  AT BEDTIME UP TO 10MG 

## 2012-09-09 ENCOUNTER — Telehealth: Payer: Self-pay

## 2012-09-09 NOTE — Telephone Encounter (Signed)
F/u lab results please call.

## 2012-09-09 NOTE — Telephone Encounter (Signed)
Attempted call to Ms Birch. Labs are WNL. [facility] is closed for lunch hour.

## 2012-09-11 NOTE — Telephone Encounter (Signed)
I spoke with Kathryn Farrell and told her that her labs were all normal.  She requested a copy of her visit note be faxed to her at her office.fx [phone]. Done.

## 2012-09-21 ENCOUNTER — Telehealth: Payer: Self-pay | Admitting: *Deleted

## 2012-09-21 NOTE — Telephone Encounter (Signed)
Patient called office to check on status. Was advised to make appointment to see Dr. Riley Kill or Clydie Braun.

## 2012-09-21 NOTE — Telephone Encounter (Signed)
She can see me if a spot's available tomorrow---if not is she all right with seeing karen
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
## 2012-09-22 ENCOUNTER — Ambulatory Visit: Payer: BC Managed Care – PPO | Admitting: Physical Medicine and Rehabilitation

## 2012-10-09 ENCOUNTER — Encounter
Payer: BC Managed Care – PPO | Attending: Physical Medicine & Rehabilitation | Admitting: Physical Medicine & Rehabilitation

## 2012-10-09 ENCOUNTER — Encounter: Payer: Self-pay | Admitting: Physical Medicine & Rehabilitation

## 2012-10-09 VITALS — BP 114/57 | HR 77 | Resp 14 | Ht 60.0 in | Wt 135.4 lb

## 2012-10-09 DIAGNOSIS — X58XXXS Exposure to other specified factors, sequela: Secondary | ICD-10-CM | POA: Insufficient documentation

## 2012-10-09 DIAGNOSIS — M76899 Other specified enthesopathies of unspecified lower limb, excluding foot: Secondary | ICD-10-CM | POA: Insufficient documentation

## 2012-10-09 DIAGNOSIS — M1710 Unilateral primary osteoarthritis, unspecified knee: Secondary | ICD-10-CM

## 2012-10-09 DIAGNOSIS — F341 Dysthymic disorder: Secondary | ICD-10-CM | POA: Insufficient documentation

## 2012-10-09 DIAGNOSIS — M47816 Spondylosis without myelopathy or radiculopathy, lumbar region: Secondary | ICD-10-CM

## 2012-10-09 DIAGNOSIS — G890 Central pain syndrome: Secondary | ICD-10-CM

## 2012-10-09 DIAGNOSIS — M47812 Spondylosis without myelopathy or radiculopathy, cervical region: Secondary | ICD-10-CM | POA: Insufficient documentation

## 2012-10-09 DIAGNOSIS — M47817 Spondylosis without myelopathy or radiculopathy, lumbosacral region: Secondary | ICD-10-CM | POA: Insufficient documentation

## 2012-10-09 DIAGNOSIS — IMO0002 Reserved for concepts with insufficient information to code with codable children: Secondary | ICD-10-CM | POA: Insufficient documentation

## 2012-10-09 DIAGNOSIS — M7060 Trochanteric bursitis, unspecified hip: Secondary | ICD-10-CM

## 2012-10-09 DIAGNOSIS — M5412 Radiculopathy, cervical region: Secondary | ICD-10-CM

## 2012-10-09 DIAGNOSIS — M961 Postlaminectomy syndrome, not elsewhere classified: Secondary | ICD-10-CM | POA: Insufficient documentation

## 2012-10-09 DIAGNOSIS — F418 Other specified anxiety disorders: Secondary | ICD-10-CM | POA: Insufficient documentation

## 2012-10-09 MED ORDER — FENTANYL 12 MCG/HR TD PT72: 1.0000 | MEDICATED_PATCH | TRANSDERMAL | Status: DC

## 2012-10-09 MED ORDER — HYDROCODONE-ACETAMINOPHEN 7.5-325 MG PO TABS: 1.0000 | ORAL_TABLET | Freq: Four times a day (QID) | ORAL | Status: DC | PRN

## 2012-10-09 MED ORDER — CITALOPRAM HYDROBROMIDE 10 MG PO TABS: 10.0000 mg | ORAL_TABLET | Freq: Every day | ORAL | Status: DC

## 2012-10-09 NOTE — Patient Instructions (Signed)
Call me with questions 
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
## 2012-10-19 ENCOUNTER — Telehealth: Payer: Self-pay | Admitting: *Deleted

## 2012-10-19 NOTE — Telephone Encounter (Signed)
Patient is having paperwork faxed over for Dr. Riley Kill to fill out for FMLA/Disability. Patient would like these filled out for 3 up to 3 months if possible.

## 2012-10-28 DIAGNOSIS — K449 Diaphragmatic hernia without obstruction or gangrene: Secondary | ICD-10-CM

## 2012-10-28 HISTORY — DX: Diaphragmatic hernia without obstruction or gangrene: K44.9

## 2012-10-29 ENCOUNTER — Ambulatory Visit: Payer: Self-pay | Admitting: Internal Medicine

## 2012-11-02 ENCOUNTER — Ambulatory Visit: Payer: Self-pay | Admitting: Internal Medicine

## 2012-11-12 ENCOUNTER — Telehealth: Payer: Self-pay | Admitting: *Deleted

## 2012-11-12 NOTE — Telephone Encounter (Signed)
Kathryn Farrell Had appt scheduled for Friday with Kathryn Farrell but has not received her 1st STD check yet and cannot afford it so is going to reschedule. It is not because she did not want to see him, and will reschedule.

## 2012-11-23 ENCOUNTER — Telehealth: Payer: Self-pay

## 2012-11-23 NOTE — Telephone Encounter (Signed)
Patient called to get a letter stating that she is out of work and why.

## 2012-11-24 NOTE — Telephone Encounter (Signed)
Need more details

## 2012-11-24 NOTE — Telephone Encounter (Signed)
Called and notified patient that she would need to be seen per Dr. Riley Kill. Patient has an OV tomorrow on 1/29 (weather permitting).

## 2012-11-25 ENCOUNTER — Encounter
Payer: BC Managed Care – PPO | Attending: Physical Medicine & Rehabilitation | Admitting: Physical Medicine & Rehabilitation

## 2012-11-25 ENCOUNTER — Encounter: Payer: Self-pay | Admitting: Physical Medicine & Rehabilitation

## 2012-11-25 ENCOUNTER — Telehealth: Payer: Self-pay | Admitting: Physical Medicine & Rehabilitation

## 2012-11-25 VITALS — BP 112/55 | HR 78 | Resp 14 | Ht 60.0 in | Wt 137.0 lb

## 2012-11-25 DIAGNOSIS — M76899 Other specified enthesopathies of unspecified lower limb, excluding foot: Secondary | ICD-10-CM | POA: Insufficient documentation

## 2012-11-25 DIAGNOSIS — M5412 Radiculopathy, cervical region: Secondary | ICD-10-CM

## 2012-11-25 DIAGNOSIS — M7060 Trochanteric bursitis, unspecified hip: Secondary | ICD-10-CM

## 2012-11-25 DIAGNOSIS — M47817 Spondylosis without myelopathy or radiculopathy, lumbosacral region: Secondary | ICD-10-CM | POA: Insufficient documentation

## 2012-11-25 DIAGNOSIS — F418 Other specified anxiety disorders: Secondary | ICD-10-CM

## 2012-11-25 DIAGNOSIS — M961 Postlaminectomy syndrome, not elsewhere classified: Secondary | ICD-10-CM

## 2012-11-25 DIAGNOSIS — Z5181 Encounter for therapeutic drug level monitoring: Secondary | ICD-10-CM

## 2012-11-25 DIAGNOSIS — M179 Osteoarthritis of knee, unspecified: Secondary | ICD-10-CM

## 2012-11-25 DIAGNOSIS — M47816 Spondylosis without myelopathy or radiculopathy, lumbar region: Secondary | ICD-10-CM

## 2012-11-25 DIAGNOSIS — M1710 Unilateral primary osteoarthritis, unspecified knee: Secondary | ICD-10-CM

## 2012-11-25 DIAGNOSIS — X58XXXS Exposure to other specified factors, sequela: Secondary | ICD-10-CM | POA: Insufficient documentation

## 2012-11-25 DIAGNOSIS — IMO0001 Reserved for inherently not codable concepts without codable children: Secondary | ICD-10-CM

## 2012-11-25 DIAGNOSIS — M47812 Spondylosis without myelopathy or radiculopathy, cervical region: Secondary | ICD-10-CM

## 2012-11-25 DIAGNOSIS — F341 Dysthymic disorder: Secondary | ICD-10-CM | POA: Insufficient documentation

## 2012-11-25 DIAGNOSIS — IMO0002 Reserved for concepts with insufficient information to code with codable children: Secondary | ICD-10-CM | POA: Insufficient documentation

## 2012-11-25 NOTE — Telephone Encounter (Signed)
Per Dr. Riley Kill patient needs an injection to be done by Dr. Wynn Banker.  Please let me know what type of injection is needed.

## 2012-11-25 NOTE — Addendum Note (Signed)
Addended by: Judd Gaudier on: 11/25/2012 02:12 PM   Modules accepted: Orders

## 2012-11-25 NOTE — Patient Instructions (Signed)
TRY DECREASING LYRICA TO 50MG  AT NIGHT  TRY CO-Q10 SUPPLEMENT FOR ENERGY, FOCUS, PAIN

## 2012-11-25 NOTE — Telephone Encounter (Signed)
Patient was seen in office regarding her request for injection.

## 2012-11-25 NOTE — Telephone Encounter (Signed)
This is what his note says:   "RTC for potential MBB's. I want to look at the actual films first before proceding. Dr. Wynn Banker can do injections here."  Once he sees the disc then he will decide about proceeding with the injection.

## 2012-11-25 NOTE — Progress Notes (Signed)
Subjective:    Patient ID: Kathryn Farrell, female    DOB: 08-Feb-1961, 52 y.o.   MRN: [id number]  HPI  Kathryn Farrell is back regarding her multiple pain issues. She had her CT myelogram done earlier this month and her lumbar MRI as well. The myelogram showed stenosis above and below her fusion levels and the lumbar imaging revealed L5 nerve root encroachment on the left likely due to facet and HNP. I did not have the imagine to review, only the reports.   She was placed on lyrica by a fibromyalgia "expert" and is now taking 100mg  at night. She feels that it may have helped somewhat but she also notices that she's in quite a fog each morning.  She continues to complain of neck and low back pain. Her back bothers her the most if she is standing or walking for prolonged periods. It tends to bother her more on the left. It also hurts if she bends over. She finds it difficult to stand after she's been sitting or lying too long. She is not having consistent radiation of the pain into her legs.  Pain Inventory Average Pain 8 Pain Right Now 8 My pain is sharp, burning, stabbing, tingling and aching  In the last 24 hours, has pain interfered with the following? General activity 8 Relation with others 8 Enjoyment of life 8 What TIME of day is your pain at its worst? all the time Sleep (in general) Fair  Pain is worse with: walking, bending, sitting, inactivity, standing and some activites Pain improves with: rest, heat/ice, medication, TENS and injections Relief from Meds: 6  Mobility walk without assistance how many minutes can you walk? 5 ability to climb steps?  no do you drive?  yes Do you have any goals in this area?  yes  Function disabled: date disabled 12/12  Neuro/Psych weakness tingling spasms confusion depression anxiety  Prior Studies bone scan x-rays CT/MRI nerve study  Physicians involved in your care Primary care  Rheumatologist  Psychologist  Dr Marcello Fennel, Dr  Tenny Craw   Family History  Problem Relation Age of Onset  . COPD Mother   . Asthma Mother   . Cancer Father     esophageal cander with mets  . Diabetes Brother   . Stroke Brother    History   Social History  . Marital Status: Married    Spouse Name: N/A    Number of Children: N/A  . Years of Education: N/A   Social History Main Topics  . Smoking status: Former Smoker    Quit date: 10/28/1996  . Smokeless tobacco: Never Used  . Alcohol Use: None  . Drug Use: None  . Sexually Active: None   Other Topics Concern  . None   Social History Narrative  . None   Past Surgical History  Procedure Date  . Abdominal hysterectomy 1989  . Arm surgery     "muscle came off the elbow"  . Cesarean section   . Spine surgery 2008    cervical fusion  . Knee arthroscopy 2004/2008    x2   Past Medical History  Diagnosis Date  . GERD (gastroesophageal reflux disease)   . Neuromuscular disorder     centralized pain syndrome  . Osteoporosis    BP 112/55  Pulse 78  Resp 14  SpO2 98%     Review of Systems  Constitutional: Positive for unexpected weight change.  HENT: Positive for neck pain.   Gastrointestinal: Positive for nausea, diarrhea and  constipation.  Musculoskeletal: Positive for myalgias, back pain and arthralgias.  Neurological: Positive for weakness.  Psychiatric/Behavioral: Positive for confusion and dysphoric mood. The patient is nervous/anxious.   All other systems reviewed and are negative.       Objective:   Physical Exam Constitutional: She is oriented to person, place, and time. She appears well-developed.  Eyes: Conjunctivae and EOM are normal. Pupils are equal, round, and reactive to light.  Neck: Normal range of motion.  Cardiovascular: Normal rate and regular rhythm.  Pulmonary/Chest: Effort normal.  Abdominal: Soft.  Neurological: She is alert and oriented to person, place, and time. dtr's are 1-2 +. Strength in all 4 limbs intact at 5/5. Sensory  exam was unremarkable although she was complaining of tingling at the 4th and 5th fingers of the left hand.Spurling's maneuver was equivocal to negative on the right and negative on the left. Patient had more pain in general with extension. Mild pain in the neck was noted with palpation of the paraspinals. No spasm was appreciated. Positive Tinel's sign at the left ulnar groove. DTR's were all 2+. Thoracic spine was notable for spasm along the paraspinal-rhomboid area. She had tenderness with palpation of the spinous processes at that level. Low back was slightly tender to touch.  spasm was appreciated in the right greater than left paraspinals. Flexion caused some discomfort. She was able to bend and touch toes. Extension caused the most pain, facet maneuvers to the left and  right provoked pain as well. Posture overall was fair.  Patient's weight is stable. She ambulates without any obvious antalgia. She transfers easily. She does have some mild crepitus at the right knee. No knee instability is appreciated. No significant edema is seen at the right knee.   Psych: she is a bit anxious. But pleasant  Assessment & Plan:   ASSESSMENT:  1. Centralized pain syndrome/ FMS  2. Lumbar spondylosis. Likely facet and HNP disease at L5-S1 3. Bilateral greater trochanter bursitis.  4. Osteoarthritis, right knee.  5. Right forearm extensor mechanism injury.  6. 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  PLAN:  1. Continue DHEA and melatonin for sleep and pain. Also can try coQ10. 2. Hold on visit with  Dr. Vickii Chafe for pain mgt, coping skills pending disability evals.  3. Hydrocodone for breakthrough pain.  4. Continue fentanyl patch for base line pain.    5. RTC for potential MBB's. I want to look at the actual films first before proceding. Dr. Wynn Banker can do injections here. 6. Will forward imaging to Dr. Danielle Dess when it becomes available to me. 8. Needs to  remain out of work for now as I outlined in a letter for her.  9.Celexa for depression 10. Recommended decreasing lyrica as its likely causing am sedation

## 2012-12-08 ENCOUNTER — Encounter: Payer: 59 | Admitting: Physical Medicine & Rehabilitation

## 2012-12-12 ENCOUNTER — Other Ambulatory Visit: Payer: Self-pay

## 2012-12-16 ENCOUNTER — Telehealth: Payer: Self-pay

## 2012-12-16 DIAGNOSIS — M7060 Trochanteric bursitis, unspecified hip: Secondary | ICD-10-CM

## 2012-12-16 DIAGNOSIS — M47812 Spondylosis without myelopathy or radiculopathy, cervical region: Secondary | ICD-10-CM

## 2012-12-16 DIAGNOSIS — M961 Postlaminectomy syndrome, not elsewhere classified: Secondary | ICD-10-CM

## 2012-12-16 DIAGNOSIS — M1710 Unilateral primary osteoarthritis, unspecified knee: Secondary | ICD-10-CM

## 2012-12-16 DIAGNOSIS — M47816 Spondylosis without myelopathy or radiculopathy, lumbar region: Secondary | ICD-10-CM

## 2012-12-16 DIAGNOSIS — M5412 Radiculopathy, cervical region: Secondary | ICD-10-CM

## 2012-12-16 MED ORDER — FENTANYL 12 MCG/HR TD PT72: 1.0000 | MEDICATED_PATCH | TRANSDERMAL | Status: DC

## 2012-12-16 NOTE — Telephone Encounter (Signed)
i believe i was waiting on the actual films to be sent to me for review, correct? i don't recall having seen them.  Patient told me while she was here that we were given them. They have not crossed my desk.   Ok on refill

## 2012-12-16 NOTE — Telephone Encounter (Signed)
Patient called regarding injections she was supposed to have scheduled and she also needs her fentanyl patches refilled.  Script printed.  Is it ok to proceed to schedule MBB?

## 2012-12-17 NOTE — Telephone Encounter (Signed)
Patient aware script is ready.  She is going to look into getting films sent to the office for review.

## 2012-12-30 ENCOUNTER — Telehealth: Payer: Self-pay

## 2012-12-30 NOTE — Telephone Encounter (Signed)
Please review MRI.

## 2012-12-30 NOTE — Telephone Encounter (Signed)
PLEASE SET UP A LEFT TRANSFORAMINAL INJECTION AT L5-S1 BY DR. Wynn Banker ASAP. PT IS AWARE AND WISHES TO PROCEED.  PLEASE FORWARD DISC AND REPORT TO DR. HENRY ELSNER FOR REVIEW AS IT PERTAINS TO HER CERVICAL SPINE. SHE HAS SEVERE CENTRAL STENOSIS ABOVE AND BELOW HER PRIOR FUSION SITE.

## 2012-12-30 NOTE — Telephone Encounter (Signed)
Patient called to follow up on status of MRI review.  She has dropped this off again and is waiting on the decision whether she needs an injection or not.

## 2012-12-31 ENCOUNTER — Ambulatory Visit (HOSPITAL_BASED_OUTPATIENT_CLINIC_OR_DEPARTMENT_OTHER): Payer: BC Managed Care – PPO | Admitting: Physical Medicine & Rehabilitation

## 2012-12-31 ENCOUNTER — Encounter: Payer: Self-pay | Admitting: Physical Medicine & Rehabilitation

## 2012-12-31 ENCOUNTER — Other Ambulatory Visit: Payer: Self-pay | Admitting: Physical Medicine & Rehabilitation

## 2012-12-31 VITALS — BP 111/56 | HR 72 | Resp 14 | Ht 60.0 in | Wt 137.0 lb

## 2012-12-31 DIAGNOSIS — M47817 Spondylosis without myelopathy or radiculopathy, lumbosacral region: Secondary | ICD-10-CM | POA: Insufficient documentation

## 2012-12-31 DIAGNOSIS — M961 Postlaminectomy syndrome, not elsewhere classified: Secondary | ICD-10-CM | POA: Insufficient documentation

## 2012-12-31 DIAGNOSIS — M5412 Radiculopathy, cervical region: Secondary | ICD-10-CM | POA: Insufficient documentation

## 2012-12-31 DIAGNOSIS — M76899 Other specified enthesopathies of unspecified lower limb, excluding foot: Secondary | ICD-10-CM | POA: Insufficient documentation

## 2012-12-31 DIAGNOSIS — M5416 Radiculopathy, lumbar region: Secondary | ICD-10-CM

## 2012-12-31 DIAGNOSIS — M1710 Unilateral primary osteoarthritis, unspecified knee: Secondary | ICD-10-CM | POA: Insufficient documentation

## 2012-12-31 DIAGNOSIS — M47812 Spondylosis without myelopathy or radiculopathy, cervical region: Secondary | ICD-10-CM | POA: Insufficient documentation

## 2012-12-31 DIAGNOSIS — IMO0001 Reserved for inherently not codable concepts without codable children: Secondary | ICD-10-CM | POA: Insufficient documentation

## 2012-12-31 DIAGNOSIS — G4700 Insomnia, unspecified: Secondary | ICD-10-CM | POA: Insufficient documentation

## 2012-12-31 DIAGNOSIS — X58XXXS Exposure to other specified factors, sequela: Secondary | ICD-10-CM | POA: Insufficient documentation

## 2012-12-31 DIAGNOSIS — G890 Central pain syndrome: Secondary | ICD-10-CM | POA: Insufficient documentation

## 2012-12-31 DIAGNOSIS — IMO0002 Reserved for concepts with insufficient information to code with codable children: Secondary | ICD-10-CM | POA: Insufficient documentation

## 2012-12-31 DIAGNOSIS — F341 Dysthymic disorder: Secondary | ICD-10-CM | POA: Insufficient documentation

## 2012-12-31 NOTE — Patient Instructions (Addendum)
Dr. Riley Kill as scheduled on 01/18/2013. You can discuss, of your pain was relieved with his left L5 transforaminal lumbar epidural steroid injection. We injected dexamethasone and lidocaine.

## 2012-12-31 NOTE — Progress Notes (Signed)
Lumbar L5 transforaminal epidural steroid injection under fluoroscopic guidance  Indication: Lumbosacral radiculitis is not relieved by medication management or other conservative care and interfering with self-care and mobility.   Informed consent was obtained after describing risk and benefits of the procedure with the patient, this includes bleeding, bruising, infection, paralysis and medication side effects.  The patient wishes to proceed and has given written consent.  Patient was placed in prone position.  The lumbar area was marked and prepped with Betadine.  It was entered with a 25-gauge 1-1/2 inch needle and one mL of 1% lidocaine was injected into the skin and subcutaneous tissue.  Then a 22-gauge 3.5 spinal needle was inserted into the Left L5-S1 intervertebral foramen under AP, lateral, and oblique view.  Then a solution containing one mL of 10 mg per mL dexamethasone and 2 mL of 1% lidocaine was injected.  The patient tolerated procedure well.  Post procedure instructions were given.  Please see post procedure form.

## 2012-12-31 NOTE — Progress Notes (Signed)
  PROCEDURE RECORD [facility]   Name: Kathryn Farrell DOB:07-28-1961 MRN: [id number]  Date:12/31/2012  Physician: Claudette Laws, MD    Nurse/CMA: Shumaker RN  Allergies:  Allergies  Allergen Reactions  . Penicillins Other (See Comments)    Was a child; doesn't remember.  . Effexor (Venlafaxine Hydrochloride)     Makes her jittery    Consent Signed: yes  Is patient diabetic? no  CBG today?   Pregnant: no LMP: No LMP recorded. Patient is postmenopausal. (age 10-55)  Anticoagulants: no Anti-inflammatory: no Antibiotics: no  Procedure: L5 Transforaminal lumbar epidural steroid injection Position: Prone Start Time: 9:03 End Time:9:06  Fluoro Time: 14 seconds  RN/CMA Haematologist RN    Time 840 9:09    BP 111/56 113/46    Pulse 72 79    Respirations 14 14    O2 Sat 98 98    S/S 6 6    Pain Level 8/10 0/10     D/C home with husband , patient A & O X 3, D/C instructions reviewed, and sits independently.

## 2013-01-05 ENCOUNTER — Telehealth: Payer: Self-pay | Admitting: Physical Medicine & Rehabilitation

## 2013-01-06 ENCOUNTER — Telehealth: Payer: Self-pay

## 2013-01-06 NOTE — Telephone Encounter (Signed)
Patient called to request medication to help her focus, ritalin?  Please advise.

## 2013-01-06 NOTE — Telephone Encounter (Signed)
i would prefer not at this time

## 2013-01-07 ENCOUNTER — Ambulatory Visit: Payer: BC Managed Care – PPO | Admitting: Physical Medicine & Rehabilitation

## 2013-01-07 NOTE — Telephone Encounter (Signed)
Left message for patient informing her that Dr Riley Kill does not want her on ritalin at this time.  Advised her if she had any other questions she would need an appointment.

## 2013-01-18 ENCOUNTER — Encounter
Payer: BC Managed Care – PPO | Attending: Physical Medicine & Rehabilitation | Admitting: Physical Medicine & Rehabilitation

## 2013-01-18 ENCOUNTER — Encounter: Payer: Self-pay | Admitting: Physical Medicine & Rehabilitation

## 2013-01-18 VITALS — BP 123/75 | HR 72 | Resp 16 | Ht 59.0 in | Wt 138.8 lb

## 2013-01-18 DIAGNOSIS — M961 Postlaminectomy syndrome, not elsewhere classified: Secondary | ICD-10-CM

## 2013-01-18 DIAGNOSIS — G890 Central pain syndrome: Secondary | ICD-10-CM

## 2013-01-18 DIAGNOSIS — IMO0001 Reserved for inherently not codable concepts without codable children: Secondary | ICD-10-CM

## 2013-01-18 DIAGNOSIS — F418 Other specified anxiety disorders: Secondary | ICD-10-CM

## 2013-01-18 DIAGNOSIS — M47816 Spondylosis without myelopathy or radiculopathy, lumbar region: Secondary | ICD-10-CM

## 2013-01-18 DIAGNOSIS — M179 Osteoarthritis of knee, unspecified: Secondary | ICD-10-CM

## 2013-01-18 DIAGNOSIS — M47817 Spondylosis without myelopathy or radiculopathy, lumbosacral region: Secondary | ICD-10-CM

## 2013-01-18 DIAGNOSIS — M7061 Trochanteric bursitis, right hip: Secondary | ICD-10-CM

## 2013-01-18 DIAGNOSIS — M47812 Spondylosis without myelopathy or radiculopathy, cervical region: Secondary | ICD-10-CM

## 2013-01-18 DIAGNOSIS — M76899 Other specified enthesopathies of unspecified lower limb, excluding foot: Secondary | ICD-10-CM

## 2013-01-18 DIAGNOSIS — G4700 Insomnia, unspecified: Secondary | ICD-10-CM

## 2013-01-18 DIAGNOSIS — M5412 Radiculopathy, cervical region: Secondary | ICD-10-CM

## 2013-01-18 DIAGNOSIS — M1710 Unilateral primary osteoarthritis, unspecified knee: Secondary | ICD-10-CM

## 2013-01-18 DIAGNOSIS — F341 Dysthymic disorder: Secondary | ICD-10-CM

## 2013-01-18 DIAGNOSIS — IMO0002 Reserved for concepts with insufficient information to code with codable children: Secondary | ICD-10-CM

## 2013-01-18 MED ORDER — FENTANYL 12 MCG/HR TD PT72: 1.0000 | MEDICATED_PATCH | TRANSDERMAL | Status: DC

## 2013-01-18 MED ORDER — TRAZODONE HCL 50 MG PO TABS: 50.0000 mg | ORAL_TABLET | Freq: Every day | ORAL | Status: DC

## 2013-01-18 MED ORDER — OXCARBAZEPINE 150 MG PO TABS: 150.0000 mg | ORAL_TABLET | Freq: Two times a day (BID) | ORAL | Status: DC

## 2013-01-18 NOTE — Patient Instructions (Signed)
You can try increasing the DHEA to 50mg  daily to improve your energy.

## 2013-01-18 NOTE — Progress Notes (Signed)
Subjective:    Patient ID: Kathryn Farrell, female    DOB: 1961-10-04, 52 y.o.   MRN: [id number]  HPI  Kathryn Farrell is back regarding her chronic pain syndrome. The L5 nerve block which was performed did not seem to help greatly. She felt that she had increased pain in her leg after the injection. A left L5 transforaminal block was performed.  She is having pain from her left low back down the back of her leg.  She feels that her body is aching all over. She is taking her supplements including the melatonin and DHEA. She having difficulty falling and staying asleep. Pain is often a problem at night which is keeping her awake. She feels that her exercise and activity has been affected by the weather and   her pain/fatigue also.  Depresion is a problem. She stopped taking the lyrica because it was making her swell, and she decided at that point to stop taking her celexa along with it.   Pain Inventory Average Pain 8 Pain Right Now 8 My pain is sharp, burning, stabbing, tingling and aching  In the last 24 hours, has pain interfered with the following? General activity 8 Relation with others 8 Enjoyment of life 8 What TIME of day is your pain at its worst? morning,day, night time Sleep (in general) Poor  Pain is worse with: walking, bending, sitting and standing Pain improves with: rest, heat/ice, medication, TENS and injections Relief from Meds: 7  Mobility how many minutes can you walk? 30 min.  Function disabled: date disabled 10-28-12  Neuro/Psych spasms confusion depression  Prior Studies Any changes since last visit?  no  Physicians involved in your care Any changes since last visit?  no   Family History  Problem Relation Age of Onset  . COPD Mother   . Asthma Mother   . Cancer Father     esophageal cander with mets  . Diabetes Brother   . Stroke Brother    History   Social History  . Marital Status: Married    Spouse Name: N/A    Number of Children: N/A  . Years  of Education: N/A   Social History Main Topics  . Smoking status: Former Smoker    Quit date: 10/28/1996  . Smokeless tobacco: Never Used  . Alcohol Use: None  . Drug Use: None  . Sexually Active: None   Other Topics Concern  . None   Social History Narrative  . None   Past Surgical History  Procedure Laterality Date  . Abdominal hysterectomy  1989  . Arm surgery      "muscle came off the elbow"  . Cesarean section    . Spine surgery  2008    cervical fusion  . Knee arthroscopy  2004/2008    x2   Past Medical History  Diagnosis Date  . GERD (gastroesophageal reflux disease)   . Neuromuscular disorder     centralized pain syndrome  . Osteoporosis    BP 123/75  Pulse 72  Resp 16  Ht 4\' 11"  (1.499 m)  Wt 138 lb 12.8 oz (62.959 kg)  BMI 28.02 kg/m2  SpO2 100%      Review of Systems  Psychiatric/Behavioral: Positive for confusion and dysphoric mood.  All other systems reviewed and are negative.       Objective:   Physical Exam Constitutional: She is oriented to person, place, and time. She appears well-developed. She may have gained a little weight. Eyes: Conjunctivae and EOM  are normal. Pupils are equal, round, and reactive to light.  Neck: Normal range of motion.  Cardiovascular: Normal rate and regular rhythm.  Pulmonary/Chest: Effort normal.  Abdominal: Soft.  Neurological: She is alert and oriented to person, place, and time. dtr's are 1-2 +. Strength in all 4 limbs intact at 5/5. Sensory exam was unremarkable although she was complaining of tingling at the 4th and 5th fingers of the left hand.Spurling's maneuver remains equivocal to negative on the right and negative on the left. Patient had more pain in general with extension. Mild pain in the neck was noted with palpation of the paraspinals. No spasm was appreciated. Positive Tinel's sign at the left ulnar groove. DTR's were all 2+. Thoracic spine was notable for spasm along the bilateral  paraspinal-rhomboid area. She has tenderness with palpation of the spinous processes at that level. Low back was slightly tender to touch. spasm was appreciated in predominanty the right lumbar paraspinals. Flexion caused some discomfort. She was able to bend and touch toes. Extension caused the most pain, facet maneuvers to the left and right provoked pain as well. Posture overall was fair.  Patient's weight is stable. She ambulates without any obvious antalgia. She transfers easily. She does have some mild crepitus at the right knee. No knee instability is appreciated. No significant edema is seen at the right knee.  Psych: she is a bit anxious. But pleasant   Assessment & Plan:   ASSESSMENT:  1. Centralized pain syndrome/ FMS  2. Lumbar spondylosis. Likely facet disease. Left L5 radiculopathy 3. Bilateral greater trochanter bursitis.  4. Osteoarthritis, right knee.  5. Right forearm extensor mechanism injury.  6. 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  7. Insomnia 8. Depression.   PLAN:  1. Continue DHEA and melatonin for sleep and pain. Also can try coQ10.  2. Requested that she see Dr. Vickii Chafe for pain mgt, coping skills, mood. I feel that better coping skills and strategies are vital for her in managing her pain. 3. Hydrocodone for breakthrough pain.  4. Continue fentanyl patch for base line pain.  5. Will try trileptal for her neuropathic and fibromyalgia pain. Begin at 150mg  qhs and titrate to bid. We need to find a tolerable medication that helps to provide generalized pain relief.. Lyrica was stopped. 6. Want to revisit trazodone for sleep (along with melatonin), 50mg  qhs. Ultimately her sleep will improve further with pain control as well. 8. Needs to remain out of work for now as I have outline.   9. I want her to resume celexa for depression  10. Follow up with me in about 6 weeks. 30 minutes of face to face patient care time were  spent during this visit. All questions were encouraged and answered.

## 2013-01-19 ENCOUNTER — Ambulatory Visit: Payer: BC Managed Care – PPO | Admitting: Physical Medicine & Rehabilitation

## 2013-01-27 ENCOUNTER — Other Ambulatory Visit: Payer: Self-pay | Admitting: Physical Medicine & Rehabilitation

## 2013-02-22 ENCOUNTER — Encounter: Payer: 59 | Attending: Physical Medicine & Rehabilitation | Admitting: Physical Medicine & Rehabilitation

## 2013-02-22 ENCOUNTER — Encounter: Payer: Self-pay | Admitting: Physical Medicine & Rehabilitation

## 2013-02-22 VITALS — BP 103/67 | HR 73 | Resp 15 | Ht 60.0 in | Wt 136.0 lb

## 2013-02-22 DIAGNOSIS — M5412 Radiculopathy, cervical region: Secondary | ICD-10-CM

## 2013-02-22 DIAGNOSIS — G890 Central pain syndrome: Secondary | ICD-10-CM

## 2013-02-22 DIAGNOSIS — M76899 Other specified enthesopathies of unspecified lower limb, excluding foot: Secondary | ICD-10-CM

## 2013-02-22 DIAGNOSIS — M1710 Unilateral primary osteoarthritis, unspecified knee: Secondary | ICD-10-CM | POA: Insufficient documentation

## 2013-02-22 DIAGNOSIS — G4700 Insomnia, unspecified: Secondary | ICD-10-CM

## 2013-02-22 DIAGNOSIS — F418 Other specified anxiety disorders: Secondary | ICD-10-CM

## 2013-02-22 DIAGNOSIS — X58XXXS Exposure to other specified factors, sequela: Secondary | ICD-10-CM | POA: Insufficient documentation

## 2013-02-22 DIAGNOSIS — F341 Dysthymic disorder: Secondary | ICD-10-CM

## 2013-02-22 DIAGNOSIS — M47816 Spondylosis without myelopathy or radiculopathy, lumbar region: Secondary | ICD-10-CM

## 2013-02-22 DIAGNOSIS — M7061 Trochanteric bursitis, right hip: Secondary | ICD-10-CM

## 2013-02-22 DIAGNOSIS — IMO0002 Reserved for concepts with insufficient information to code with codable children: Secondary | ICD-10-CM

## 2013-02-22 DIAGNOSIS — M47812 Spondylosis without myelopathy or radiculopathy, cervical region: Secondary | ICD-10-CM

## 2013-02-22 DIAGNOSIS — M47817 Spondylosis without myelopathy or radiculopathy, lumbosacral region: Secondary | ICD-10-CM

## 2013-02-22 DIAGNOSIS — IMO0001 Reserved for inherently not codable concepts without codable children: Secondary | ICD-10-CM

## 2013-02-22 DIAGNOSIS — M961 Postlaminectomy syndrome, not elsewhere classified: Secondary | ICD-10-CM

## 2013-02-22 MED ORDER — FENTANYL 12 MCG/HR TD PT72: 1.0000 | MEDICATED_PATCH | TRANSDERMAL | Status: DC

## 2013-02-22 MED ORDER — HYDROCODONE-ACETAMINOPHEN 7.5-325 MG PO TABS: ORAL_TABLET | ORAL | Status: DC

## 2013-02-22 NOTE — Patient Instructions (Signed)
CALL ME WITH ANY PROBLEMS OR QUESTIONS (#[phone]).  HAVE A GOOD DAY  

## 2013-02-22 NOTE — Progress Notes (Signed)
Subjective:    Patient ID: Kathryn Farrell, female    DOB: June 29, 1961, 52 y.o.   MRN: [id number]  HPI  Kathryn Farrell is back regarding her chronic pain. She tells me that in hindsight, she feels the L5 block was helpful as the pain has improved in her left leg. She is still having back pain with radiation to her left buttock however. The pain is worse when she bends or twists. She occasionally has tingling in the right leg, but it's inconsistent.   She is trying to stay as active as she can with with exercise and acitivities with her family. She is self-conscious about weight gain.   Her pain meds seem to be helpinhg as a whole. She did not increase the trileptal to BID as she forgot to increase it after starting the initial rx.    Pain Inventory Average Pain 7 Pain Right Now 7 My pain is sharp, burning, stabbing, tingling and aching  In the last 24 hours, has pain interfered with the following? General activity 7 Relation with others 7 Enjoyment of life 7 What TIME of day is your pain at its worst? morning,day,evening,night time Sleep (in general) Fair  Pain is worse with: walking, bending, sitting, standing and some activites Pain improves with: rest, heat/ice, therapy/exercise, medication, TENS and injections Relief from Meds: 7  Mobility how many minutes can you walk? 30 min. ability to climb steps?  yes  Function not employed: date last employed 10-28-12  Neuro/Psych numbness spasms  Prior Studies Any changes since last visit?  no  Physicians involved in your care Any changes since last visit?  no   Family History  Problem Relation Age of Onset  . COPD Mother   . Asthma Mother   . Cancer Father     esophageal cander with mets  . Diabetes Brother   . Stroke Brother    History   Social History  . Marital Status: Married    Spouse Name: N/A    Number of Children: N/A  . Years of Education: N/A   Social History Main Topics  . Smoking status: Former Smoker     Quit date: 10/28/1996  . Smokeless tobacco: Never Used  . Alcohol Use: None  . Drug Use: None  . Sexually Active: None   Other Topics Concern  . None   Social History Narrative  . None   Past Surgical History  Procedure Laterality Date  . Abdominal hysterectomy  1989  . Arm surgery      "muscle came off the elbow"  . Cesarean section    . Spine surgery  2008    cervical fusion  . Knee arthroscopy  2004/2008    x2   Past Medical History  Diagnosis Date  . GERD (gastroesophageal reflux disease)   . Neuromuscular disorder     centralized pain syndrome  . Osteoporosis    BP 103/67  Pulse 73  Resp 15  Ht 5' (1.524 m)  Wt 136 lb (61.689 kg)  BMI 26.56 kg/m2  SpO2 97%     Review of Systems  Gastrointestinal: Positive for nausea.  Neurological: Positive for numbness.       Spasms   All other systems reviewed and are negative.       Objective:   Physical Exam  Constitutional: She is oriented to person, place, and time. She appears well-developed. She may have gained a little weight.  Eyes: Conjunctivae and EOM are normal. Pupils are equal, round, and reactive  to light.  Neck: Normal range of motion.  Cardiovascular: Normal rate and regular rhythm.  Pulmonary/Chest: Effort normal.  Abdominal: Soft.  Neurological: She is alert and oriented to person, place, and time. dtr's are 1-2 +. Strength in all 4 limbs intact at 5/5. Spurling's maneuver remains equivocal to negative on the right and negative on the left. Patient had more pain in general with extension. Mild pain in the neck was noted with palpation of the paraspinals. No spasm was appreciated.  DTR's were all 2+. Thoracic spine was notable for spasm along the bilateral paraspinal-rhomboid area. She has tenderness with palpation of the spinous processes at that level. Low back was slightly tender to touch. spasm was appreciated in predominanty the right lumbar paraspinals. Flexion caused some discomfort. She was  able to bend and touch toes. Extension caused the most pain, facet maneuvers to the left and right  Were equivocal. She had pain with bending to the left as well. SLR maneuver to the left was equivocal. Posture overall was fair.  Patient's weight is stable. She ambulates without any obvious antalgia. She transfers easily. She does have some mild crepitus at the right knee. No knee instability is appreciated. No significant edema is seen at the right knee.  Psych: she is a bit anxious. But pleasant   Assessment & Plan:   ASSESSMENT:  1. Centralized pain syndrome/ FMS  2. Lumbar spondylosis. With facet and disc disease. Left L5 radiculopathy (improved)  3. Bilateral greater trochanter bursitis.  4. Osteoarthritis, right knee.  5. Right forearm extensor mechanism injury.  6. 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  7. Insomnia  8. Depression.   PLAN:  1. Continue DHEA and melatonin for sleep and pain. Also can try coQ10.  2. Will hold on Dr. Zenda Alpers given financial considerations (insurance no longer covers visits) 3. Hydrocodone for breakthrough pain.  4. Continue fentanyl patch for base line pain.  5.Continue trileptal for her neuropathic and fibromyalgia pain. Needs to increase to bid.  6.trazodone for sleep (along with melatonin), 50mg  qhs. Ultimately her sleep will improve further with pain control as well.  8. Needs to remain out of work for now. 9. Will set up with Dr. Wynn Banker for Left L4-5 translaminar ESI to capture disc and descending nerve roots at this level 10. Follow up with me about a month after injections. 30 minutes of face to face patient care time were spent during this visit. All questions were encouraged and answered. Med refills could be done at next visit.

## 2013-03-02 ENCOUNTER — Ambulatory Visit: Payer: Self-pay

## 2013-03-18 ENCOUNTER — Other Ambulatory Visit: Payer: Self-pay | Admitting: Physical Medicine & Rehabilitation

## 2013-03-18 ENCOUNTER — Other Ambulatory Visit: Payer: Self-pay

## 2013-03-18 DIAGNOSIS — M47812 Spondylosis without myelopathy or radiculopathy, cervical region: Secondary | ICD-10-CM

## 2013-03-18 DIAGNOSIS — M7061 Trochanteric bursitis, right hip: Secondary | ICD-10-CM

## 2013-03-18 DIAGNOSIS — M47816 Spondylosis without myelopathy or radiculopathy, lumbar region: Secondary | ICD-10-CM

## 2013-03-18 DIAGNOSIS — M961 Postlaminectomy syndrome, not elsewhere classified: Secondary | ICD-10-CM

## 2013-03-18 DIAGNOSIS — M1710 Unilateral primary osteoarthritis, unspecified knee: Secondary | ICD-10-CM

## 2013-03-18 DIAGNOSIS — M5412 Radiculopathy, cervical region: Secondary | ICD-10-CM

## 2013-03-18 MED ORDER — FENTANYL 12 MCG/HR TD PT72: 1.0000 | MEDICATED_PATCH | TRANSDERMAL | Status: DC

## 2013-03-18 MED ORDER — HYDROCODONE-ACETAMINOPHEN 7.5-325 MG PO TABS: ORAL_TABLET | ORAL | Status: DC

## 2013-03-18 NOTE — Telephone Encounter (Signed)
Patient appointment rescheduled and she needs a refill on her norco and patches.  Scripts printed.  Patient aware.

## 2013-03-23 ENCOUNTER — Ambulatory Visit: Payer: BC Managed Care – PPO | Admitting: Physical Medicine & Rehabilitation

## 2013-04-01 ENCOUNTER — Ambulatory Visit: Payer: Self-pay | Admitting: Internal Medicine

## 2013-04-08 ENCOUNTER — Ambulatory Visit (HOSPITAL_BASED_OUTPATIENT_CLINIC_OR_DEPARTMENT_OTHER): Payer: 59 | Admitting: Physical Medicine & Rehabilitation

## 2013-04-08 ENCOUNTER — Encounter: Payer: Self-pay | Admitting: Physical Medicine & Rehabilitation

## 2013-04-08 VITALS — BP 118/67 | HR 78 | Resp 14 | Ht 60.0 in | Wt 138.0 lb

## 2013-04-08 DIAGNOSIS — M47812 Spondylosis without myelopathy or radiculopathy, cervical region: Secondary | ICD-10-CM | POA: Insufficient documentation

## 2013-04-08 DIAGNOSIS — M961 Postlaminectomy syndrome, not elsewhere classified: Secondary | ICD-10-CM

## 2013-04-08 DIAGNOSIS — M47817 Spondylosis without myelopathy or radiculopathy, lumbosacral region: Secondary | ICD-10-CM | POA: Insufficient documentation

## 2013-04-08 DIAGNOSIS — M1710 Unilateral primary osteoarthritis, unspecified knee: Secondary | ICD-10-CM | POA: Insufficient documentation

## 2013-04-08 DIAGNOSIS — Z79899 Other long term (current) drug therapy: Secondary | ICD-10-CM

## 2013-04-08 DIAGNOSIS — X58XXXS Exposure to other specified factors, sequela: Secondary | ICD-10-CM | POA: Insufficient documentation

## 2013-04-08 DIAGNOSIS — IMO0002 Reserved for concepts with insufficient information to code with codable children: Secondary | ICD-10-CM | POA: Insufficient documentation

## 2013-04-08 DIAGNOSIS — M5412 Radiculopathy, cervical region: Secondary | ICD-10-CM | POA: Insufficient documentation

## 2013-04-08 DIAGNOSIS — M5416 Radiculopathy, lumbar region: Secondary | ICD-10-CM

## 2013-04-08 DIAGNOSIS — Z5181 Encounter for therapeutic drug level monitoring: Secondary | ICD-10-CM

## 2013-04-08 DIAGNOSIS — M76899 Other specified enthesopathies of unspecified lower limb, excluding foot: Secondary | ICD-10-CM | POA: Insufficient documentation

## 2013-04-08 NOTE — Progress Notes (Signed)
  PROCEDURE RECORD [facility]   Name: DESHAWNA MCNEECE DOB:[birth year] MRN: [id number]  Date:04/08/2013  Physician: Claudette Laws, MD    Nurse/CMA: Shumaker RN  Allergies:  Allergies  Allergen Reactions  . Penicillins Other (See Comments)    Was a child; doesn't remember.  . Effexor (Venlafaxine Hydrochloride)     Makes her jittery    Consent Signed: yes  Is patient diabetic? no  CBG today?   Pregnant: no LMP: No LMP recorded. Patient is postmenopausal. (age 7-55)  Anticoagulants: no Anti-inflammatory: no Antibiotics: no  Procedure: Translaminar Lumbar Epidural Steroid Injection Position: Prone Start Time: 1:17 End Time: 1:21 Fluoro Time: 15 second  RN/CMA Designer, multimedia    Time 13:01 1:25    BP 118/67 123/47    Pulse 78 79    Respirations 14 14    O2 Sat 98 100    S/S 6 6    Pain Level 7/10 0/10     D/C home with husband, patient A & O X 3, D/C instructions reviewed, and sits independently.

## 2013-04-08 NOTE — Patient Instructions (Signed)
Epidural Steroid Injection  Care After   Refer to this sheet in the next few weeks. These instructions provide you with information on caring for yourself after your procedure. Your caregiver may also give you more specific instructions. Your treatment has been planned according to current medical practices, but problems sometimes occur. Call your caregiver if you have any problems or questions after your procedure.  HOME CARE INSTRUCTIONS   · Avoid the use of heat on the injection site for a day.  · Do not have a tub bath or soak in water for the rest of the day.  · Remove the bandage on the next day.  · Resume your normal activities on the next day.  · Use ice packs or mild pain relievers to reduce the soreness around the injection site.  · Follow up with your caregiver 7 to 10 days after the procedure.  SEEK MEDICAL CARE IF:   · You develop a fever of more than 100.5° F (38.1° C).  · You continue to have pain and soreness over the injection site even after taking medicines.  · You develop significant nausea or vomiting.  SEEK IMMEDIATE MEDICAL CARE IF:   · You have severe back pain, which is not relieved by medicines.  · You develop severe headache, stiff neck, or sensitivity to light.  · You develop any new numbness or weakness of your legs.  · You lose control over your bladder or bowel movements.  · You develop a fever of more than 102° F (38.9° C).  · You develop difficulty breathing.  Document Released: 01/29/2011 Document Revised: 01/06/2012 Document Reviewed: 01/29/2011  [employer]® Patient Information ©2014 [employer].

## 2013-04-08 NOTE — Progress Notes (Signed)
Lumbar epidural steroid injection under fluoroscopic guidance  Indication: Lumbosacral radiculitis is not relieved by medication management or other conservative care and interfering with self-care and mobility.  Informed consent was obtained after describing risk and benefits of the procedure with the patient, this includes bleeding, bruising, infection, paralysis and medication side effects.  The patient wishes to proceed and has given written consent.  Patient was placed in a prone position.  The lumbar area was marked and prepped with Betadine.  It was entered with a 25-gauge 1-1/2 inch needle and one mL of 1% lidocaine was injected into the skin and subcutaneous tissue.  Then a 17-gauge spinal needle was inserted under fluoroscopic guidance into the Left L4-5 interlaminar space under AP and Lateral imaging.  Once needle tip of approximated the posterior elements, a loss of resistance technique was utilized with lateral imaging.  A positive loss of resistance was obtained and then confirmed by injecting 2 mL's of Omnipaque 180.  Then a solution containing 2 mL's of 40 mg per mL depomedrol and 2 mL's of 1% lidocaine was injected.  The patient tolerated procedure well.  Post procedure instructions were given.  Please se post procedure form.

## 2013-04-14 ENCOUNTER — Encounter: Payer: Self-pay | Admitting: Physical Medicine & Rehabilitation

## 2013-04-14 ENCOUNTER — Encounter: Payer: 59 | Attending: Physical Medicine & Rehabilitation | Admitting: Physical Medicine & Rehabilitation

## 2013-04-14 VITALS — BP 112/63 | HR 76 | Resp 16 | Ht 60.0 in | Wt 136.0 lb

## 2013-04-14 DIAGNOSIS — M961 Postlaminectomy syndrome, not elsewhere classified: Secondary | ICD-10-CM

## 2013-04-14 DIAGNOSIS — M1710 Unilateral primary osteoarthritis, unspecified knee: Secondary | ICD-10-CM

## 2013-04-14 DIAGNOSIS — M47817 Spondylosis without myelopathy or radiculopathy, lumbosacral region: Secondary | ICD-10-CM

## 2013-04-14 DIAGNOSIS — F341 Dysthymic disorder: Secondary | ICD-10-CM

## 2013-04-14 DIAGNOSIS — M76899 Other specified enthesopathies of unspecified lower limb, excluding foot: Secondary | ICD-10-CM

## 2013-04-14 DIAGNOSIS — M47816 Spondylosis without myelopathy or radiculopathy, lumbar region: Secondary | ICD-10-CM

## 2013-04-14 DIAGNOSIS — M5412 Radiculopathy, cervical region: Secondary | ICD-10-CM

## 2013-04-14 DIAGNOSIS — M7061 Trochanteric bursitis, right hip: Secondary | ICD-10-CM

## 2013-04-14 DIAGNOSIS — F418 Other specified anxiety disorders: Secondary | ICD-10-CM

## 2013-04-14 DIAGNOSIS — M47812 Spondylosis without myelopathy or radiculopathy, cervical region: Secondary | ICD-10-CM

## 2013-04-14 DIAGNOSIS — IMO0001 Reserved for inherently not codable concepts without codable children: Secondary | ICD-10-CM

## 2013-04-14 MED ORDER — FENTANYL 12 MCG/HR TD PT72: 1.0000 | MEDICATED_PATCH | TRANSDERMAL | Status: DC

## 2013-04-14 MED ORDER — HYDROCODONE-ACETAMINOPHEN 7.5-325 MG PO TABS: ORAL_TABLET | ORAL | Status: DC

## 2013-04-14 MED ORDER — PREDNISONE 20 MG PO TABS: 20.0000 mg | ORAL_TABLET | Freq: Every day | ORAL | Status: DC

## 2013-04-14 NOTE — Patient Instructions (Signed)
CALL ME WITH ANY PROBLEMS OR QUESTIONS (#[phone]).  HAVE A GOOD DAY  

## 2013-04-14 NOTE — Progress Notes (Signed)
Subjective:    Patient ID: Kathryn Farrell, female    DOB: Sep 04, 1961, 52 y.o.   MRN: [id number]  HPI  Kathryn Farrell is back regarding her chronic pain syndrome. She had good results with the L4-5 ESI by Dr. Wynn Banker. The pain radiating to her feet has resolved. She is still having some discomfort with her low back when she stands for long periods of time. This pain typically will radiate into her buttocks---sometimes she'll have some associated inguinal pain.   She feels that she's been in the middle of a "flare" recently with an increase in her neck and shoulder girdle pain.   She is also having problems with GERD and digestion of food/abdominal pain. She suspects it's her gallbladder which has been a problem in the past.   Pain Inventory Average Pain 7 Pain Right Now 7 My pain is sharp, burning, stabbing, tingling and aching  In the last 24 hours, has pain interfered with the following? General activity 7 Relation with others 7 Enjoyment of life 7 What TIME of day is your pain at its worst? constant Sleep (in general) Fair  Pain is worse with: walking, bending, sitting, inactivity, standing and some activites Pain improves with: heat/ice, medication and injections Relief from Meds: na  Mobility walk with assistance use a cane how many minutes can you walk? 30 ability to climb steps?  yes do you drive?  yes  Function disabled: date disabled na I need assistance with the following:  meal prep, household duties and shopping  Neuro/Psych weakness numbness tingling spasms confusion depression  Prior Studies Any changes since last visit?  no  Physicians involved in your care Any changes since last visit?  no   Family History  Problem Relation Age of Onset  . COPD Mother   . Asthma Mother   . Cancer Father     esophageal cander with mets  . Diabetes Brother   . Stroke Brother    History   Social History  . Marital Status: Married    Spouse Name: N/A    Number  of Children: N/A  . Years of Education: N/A   Social History Main Topics  . Smoking status: Former Smoker    Quit date: 10/28/1996  . Smokeless tobacco: Never Used  . Alcohol Use: None  . Drug Use: None  . Sexually Active: None   Other Topics Concern  . None   Social History Narrative  . None   Past Surgical History  Procedure Laterality Date  . Abdominal hysterectomy  1989  . Arm surgery      "muscle came off the elbow"  . Cesarean section    . Spine surgery  2008    cervical fusion  . Knee arthroscopy  2004/2008    x2   Past Medical History  Diagnosis Date  . GERD (gastroesophageal reflux disease)   . Neuromuscular disorder     centralized pain syndrome  . Osteoporosis    BP 112/63  Pulse 76  Resp 16  Ht 5' (1.524 m)  Wt 136 lb (61.689 kg)  BMI 26.56 kg/m2  SpO2 99%     Review of Systems  Gastrointestinal: Positive for nausea, vomiting and diarrhea.  Neurological: Positive for weakness and numbness.  Psychiatric/Behavioral: Positive for confusion and dysphoric mood.  All other systems reviewed and are negative.       Objective:   Physical Exam  Constitutional: She is oriented to person, place, and time. She appears well-developed. She may have  gained a little weight.  Eyes: Conjunctivae and EOM are normal. Pupils are equal, round, and reactive to light.  Neck: Normal range of motion.  Cardiovascular: Normal rate and regular rhythm.  Pulmonary/Chest: Effort normal.  Abdominal: Soft.  Neurological: She is alert and oriented to person, place, and time. dtr's are 1-2 +. Strength in all 4 limbs intact at 5/5. Spurling's maneuver remains equivocal to negative on the right and negative on the left. Extension pain noted and also Mild pain in the neck was noted with palpation of the paraspinals. No spasm was appreciated. DTR's were all 2+. Thoracic spine was notable for spasm along the bilateral paraspinal-rhomboid area. She has tenderness with palpation of  the spinous processes at that level. Low back was slightly tender to touch. spasm was appreciated in predominanty the right lumbar paraspinals. Flexion caused some discomfort. She was able to bend and touch toes. Extension continues to cause  the most pain, positive facet maneuvers to the left and right. She had pain with bending to the left as well. SLR maneuver to the left was equivocal. Posture overall was fair.  Patient's weight is stable. She ambulates without any obvious antalgia. She transfers easily. She does have some mild crepitus at the right knee. No knee instability is appreciated. No significant edema is seen at the right knee.  Psych: she   pleasant  And appropriate. Assessment & Plan:   ASSESSMENT:  1. Centralized pain syndrome/ FMS  2. Lumbar spondylosis. With facet and disc disease. Left L5 radiculopathy (improved)  3. Bilateral greater trochanter bursitis.  4. Osteoarthritis, right knee.  5. Right forearm extensor mechanism injury.  6. 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  7. Insomnia  8. Depression.    PLAN:  1. Continue DHEA and melatonin for sleep and pain. Also can try coQ10.  2.Discussed constructive mgt of stress. 3. Hydrocodone for breakthrough pain. rf rx written 4. Continue fentanyl patch for base line pain. rf rx written 5.Continue trileptal 150mg  BID for FMS pain. 6. Trazodone for sleep (along with melatonin), 50mg  qhs. Ultimately her sleep will improve further with pain control as well.  8. Needs to remain out of work for now.  9. Medrol dose pack was written today for acute flare 10. Follow up with me in about 2 months. She may pick up rf next month.  30 minutes of face to face patient care time were spent during this visit. All questions were encouraged and answered.

## 2013-04-19 ENCOUNTER — Telehealth: Payer: Self-pay | Admitting: Physical Medicine & Rehabilitation

## 2013-04-19 ENCOUNTER — Emergency Department: Payer: Self-pay | Admitting: Emergency Medicine

## 2013-04-19 LAB — COMPREHENSIVE METABOLIC PANEL
Albumin: 3.9 g/dL (ref 3.4–5.0)
BUN: 12 mg/dL (ref 7–18)
Chloride: 107 mmol/L (ref 98–107)
Co2: 27 mmol/L (ref 21–32)
Glucose: 88 mg/dL (ref 65–99)
Osmolality: 279 (ref 275–301)
Potassium: 3.7 mmol/L (ref 3.5–5.1)
SGPT (ALT): 16 U/L (ref 12–78)
Sodium: 140 mmol/L (ref 136–145)
Total Protein: 7.1 g/dL (ref 6.4–8.2)

## 2013-04-19 LAB — TROPONIN I: Troponin-I: <0.02 ng/mL

## 2013-04-19 LAB — CBC
HCT: 38.4 % (ref 35.0–47.0)
HGB: 13.3 g/dL (ref 12.0–16.0)
MCH: 30.3 pg (ref 26.0–34.0)
MCHC: 34.6 g/dL (ref 32.0–36.0)
RBC: 4.37 10*6/uL (ref 3.80–5.20)
RDW: 13.6 % (ref 11.5–14.5)

## 2013-04-19 LAB — LIPASE, BLOOD: Lipase: 118 U/L (ref 73–393)

## 2013-04-19 NOTE — Telephone Encounter (Signed)
I would like to hear pt's excuse as to why there was no fentanyl in her urine while she claimed to have had the patch on that day she submitted urine for testing.

## 2013-04-21 NOTE — Telephone Encounter (Signed)
Patient says she changed her patch the day before.

## 2013-04-21 NOTE — Telephone Encounter (Signed)
Noted uds next visit on appointment note.

## 2013-04-21 NOTE — Telephone Encounter (Signed)
Re-test urine at next office visit

## 2013-04-23 ENCOUNTER — Ambulatory Visit: Payer: Self-pay | Admitting: Internal Medicine

## 2013-04-27 HISTORY — PX: UPPER GI ENDOSCOPY: SHX6162

## 2013-05-04 ENCOUNTER — Encounter: Payer: Self-pay | Admitting: General Surgery

## 2013-05-04 ENCOUNTER — Ambulatory Visit (INDEPENDENT_AMBULATORY_CARE_PROVIDER_SITE_OTHER): Payer: 59 | Admitting: General Surgery

## 2013-05-04 VITALS — BP 108/78 | HR 74 | Resp 12 | Ht 60.0 in | Wt 139.0 lb

## 2013-05-04 DIAGNOSIS — G8929 Other chronic pain: Secondary | ICD-10-CM

## 2013-05-04 DIAGNOSIS — R1011 Right upper quadrant pain: Secondary | ICD-10-CM

## 2013-05-04 DIAGNOSIS — R109 Unspecified abdominal pain: Secondary | ICD-10-CM

## 2013-05-04 NOTE — Patient Instructions (Addendum)

## 2013-05-04 NOTE — Progress Notes (Signed)
Patient ID: Kathryn Farrell, female   DOB: 08/15/1961, 52 y.o.   MRN: [id number]  Chief Complaint  Patient presents with  . Other    gallbladder    HPI Kathryn Farrell is a 52 y.o. female.  Patient here today for abdominal pain for on/off for couple years but for 3-4 months it seems to be happening daily.  Went to ER 2 weeks ago 04-01-13 for nausea, vomiting, increased weakness and abdominal pain. Diarrhea is daily with some urgency. Vomiting is about once a week. Pain is epigastric area radiating to right flank.  She has not had the Zofran for about one week because it seems to cause constipation.U/S done 04-01-13 and HIDA scan done 04-23-13.  Endoscopy done last week at [facility] that showed hiatal hernia. Describes a full/bloated feeling all day long. Pressure on the stomach can make it more uncomfortable.  She states she "burps" all day long and coffee makes her nauseated. Denies blood in the stool. Does not seem to be associated with any foods.   HPI  Past Medical History  Diagnosis Date  . GERD (gastroesophageal reflux disease)   . Neuromuscular disorder 2009    centralized pain syndrome/fibromylagia  . Osteoporosis   . C. difficile colitis 2008  . Colon polyps 2008  . Hiatal hernia 2014    Past Surgical History  Procedure Laterality Date  . Abdominal hysterectomy  1989  . Arm surgery      "muscle came off the elbow"  . Cesarean section    . Spine surgery  2008    cervical fusion  . Knee arthroscopy  2004/2008    x2  . Upper gi endoscopy  04-27-13    Dr [name]  . Colonoscopy  2008    Family History  Problem Relation Age of Onset  . COPD Mother   . Asthma Mother   . Cancer Father     esophageal cander with mets  . Diabetes Brother   . Stroke Brother     Social History History  Substance Use Topics  . Smoking status: Former Smoker    Quit date: 10/28/1996  . Smokeless tobacco: Never Used  . Alcohol Use: No    Allergies  Allergen Reactions  . Lodine (Etodolac) Nausea  And Vomiting  . Penicillins Other (See Comments)    Was a child; doesn't remember.  . Effexor (Venlafaxine Hydrochloride)     Makes her jittery    Current Outpatient Prescriptions  Medication Sig Dispense Refill  . ALPRAZolam (XANAX) 0.25 MG tablet Take 0.25 mg by mouth daily as needed.      . calcium-vitamin D (OSCAL WITH D) 500-200 MG-UNIT per tablet Take 1 tablet by mouth daily.      . citalopram (CELEXA) 10 MG tablet Take 1 tablet (10 mg total) by mouth daily.  30 tablet  4  . DiphenhydrAMINE HCl (BENADRYL ALLERGY PO) Take 12.5 mg by mouth at bedtime.      . fentaNYL (DURAGESIC - DOSED MCG/HR) 12 MCG/HR Place 1 patch (12.5 mcg total) onto the skin every 3 (three) days. Must last 30 days.  10 patch  0  . HYDROcodone-acetaminophen (NORCO) 7.5-325 MG per tablet TAKE ONE TABLET BY MOUTH EVERY 6 HOURS AS NEEDED FOR PAIN (MUST LAST 30 DAYS)  120 tablet  0  . methocarbamol (ROBAXIN) 500 MG tablet Take 1 tablet by mouth daily.      . ondansetron (ZOFRAN-ODT) 4 MG disintegrating tablet Take 1 tablet by mouth daily.      Marland Kitchen  pantoprazole (PROTONIX) 40 MG tablet Take 40 mg by mouth daily.      Marland Kitchen PREMARIN vaginal cream       . VALERIAN PO Take 3 tablets by mouth at bedtime.      . Zoledronic Acid (RECLAST IV) Inject into the vein. Once yearly       No current facility-administered medications for this visit.    Review of Systems Review of Systems  Constitutional: Positive for chills.  Respiratory: Negative.   Cardiovascular: Negative.   Gastrointestinal: Positive for nausea, vomiting and abdominal pain.  Genitourinary: Positive for flank pain.    Blood pressure 108/78, pulse 74, resp. rate 12, height 5' (1.524 m), weight 139 lb (63.05 kg).  Physical Exam Physical Exam  Constitutional: She is oriented to person, place, and time. She appears well-developed and well-nourished.  Neck: Neck supple.  Cardiovascular: Normal rate, regular rhythm, normal heart sounds and normal pulses.    Pulmonary/Chest: Breath sounds normal.  Abdominal: Normal appearance and bowel sounds are normal.  Neurological: She is alert and oriented to person, place, and time.  Skin: Skin is warm and dry.    Data Reviewed The patient had an abdominal ultrasound on 04/02/2013 that was unremarkable.  Common bile duct 3.8 mm.  HIDA scan with CCK dated 04/23/2013 showed normal ejection fraction of 69%. The patient did experience pain during the CCK infusion.  Assessment    Recurrent, progress her right upper quadrant pain. Possible due to biliary dyskinesia.    Plan    The patient was carefully questioned regarding her responses to the infusion was completed during the recent HIDA scan. The best I can determine she did experience reproduction of symptoms with CCK injection. Her ejection fraction is certainly normal.  Considering her in near normal upper endoscopy recently at [facility] and absence of other symptoms to suggest a non-biliary source, the options for management were as follows: 1) continued observation versus 2 elective cholecystectomy. The pros and cons of both options were reviewed. The patient's progressive symptoms make her reluctant to continue observation alone. It was emphasized and clearly understood by the patient that no guarantee can be given regarding relief of symptoms with cholecystectomy.y normal. The risks of the procedure including those related to bleeding, infection, biliary tract injury and anesthesia results were reviewed.  The patient will consider her options notify the office of how she desires to proceed.        Earline Mayotte 05/05/2013, 4:53 PM

## 2013-05-05 ENCOUNTER — Telehealth: Payer: Self-pay | Admitting: *Deleted

## 2013-05-05 ENCOUNTER — Other Ambulatory Visit: Payer: Self-pay | Admitting: General Surgery

## 2013-05-05 ENCOUNTER — Encounter: Payer: Self-pay | Admitting: General Surgery

## 2013-05-05 DIAGNOSIS — R1011 Right upper quadrant pain: Secondary | ICD-10-CM

## 2013-05-05 DIAGNOSIS — G8929 Other chronic pain: Secondary | ICD-10-CM | POA: Insufficient documentation

## 2013-05-05 NOTE — Telephone Encounter (Signed)
Patient called wanting to schedule gallbladder surgery. This has been arranged at [facility] for 05-07-13. Please complete orders for phone interview tomorrow. Thanks.

## 2013-05-07 ENCOUNTER — Telehealth: Payer: Self-pay | Admitting: *Deleted

## 2013-05-07 ENCOUNTER — Ambulatory Visit: Payer: Self-pay | Admitting: General Surgery

## 2013-05-07 DIAGNOSIS — K811 Chronic cholecystitis: Secondary | ICD-10-CM

## 2013-05-07 DIAGNOSIS — K8010 Calculus of gallbladder with chronic cholecystitis without obstruction: Secondary | ICD-10-CM

## 2013-05-07 DIAGNOSIS — R109 Unspecified abdominal pain: Secondary | ICD-10-CM

## 2013-05-07 HISTORY — DX: Chronic cholecystitis: K81.1

## 2013-05-07 NOTE — Telephone Encounter (Signed)
Kathryn Farrell  Kathryn Farrell is calling to let Dr Riley Kill know she is having gallbladder surgery on Friday 05/07/13, and during post op period Dr Doristine Counter may be prescribing something stronger for pain for the short term.

## 2013-05-10 ENCOUNTER — Encounter: Payer: Self-pay | Admitting: General Surgery

## 2013-05-10 NOTE — Telephone Encounter (Signed)
She needs to let us know what it is. thanks

## 2013-05-17 ENCOUNTER — Telehealth: Payer: Self-pay | Admitting: Physical Medicine & Rehabilitation

## 2013-05-17 DIAGNOSIS — M47816 Spondylosis without myelopathy or radiculopathy, lumbar region: Secondary | ICD-10-CM

## 2013-05-17 DIAGNOSIS — M1710 Unilateral primary osteoarthritis, unspecified knee: Secondary | ICD-10-CM

## 2013-05-17 DIAGNOSIS — M7061 Trochanteric bursitis, right hip: Secondary | ICD-10-CM

## 2013-05-17 DIAGNOSIS — M5412 Radiculopathy, cervical region: Secondary | ICD-10-CM

## 2013-05-17 DIAGNOSIS — M961 Postlaminectomy syndrome, not elsewhere classified: Secondary | ICD-10-CM

## 2013-05-17 DIAGNOSIS — M47812 Spondylosis without myelopathy or radiculopathy, cervical region: Secondary | ICD-10-CM

## 2013-05-17 NOTE — Telephone Encounter (Signed)
Pt called she only has two pain patches left.. Requesting refill... [id number]

## 2013-05-19 ENCOUNTER — Telehealth: Payer: Self-pay

## 2013-05-19 MED ORDER — HYDROCODONE-ACETAMINOPHEN 7.5-325 MG PO TABS: ORAL_TABLET | ORAL | Status: DC

## 2013-05-19 MED ORDER — FENTANYL 12 MCG/HR TD PT72: 1.0000 | MEDICATED_PATCH | TRANSDERMAL | Status: DC

## 2013-05-19 NOTE — Telephone Encounter (Signed)
Hydrocodone called to pharmacy  

## 2013-05-19 NOTE — Telephone Encounter (Signed)
Patient aware fentanyl patch script is ready for pick up.

## 2013-05-20 ENCOUNTER — Ambulatory Visit (INDEPENDENT_AMBULATORY_CARE_PROVIDER_SITE_OTHER): Payer: 59 | Admitting: General Surgery

## 2013-05-20 ENCOUNTER — Encounter: Payer: Self-pay | Admitting: General Surgery

## 2013-05-20 VITALS — BP 120/74 | HR 76 | Resp 12 | Ht 60.0 in | Wt 138.0 lb

## 2013-05-20 DIAGNOSIS — R1011 Right upper quadrant pain: Secondary | ICD-10-CM

## 2013-05-20 DIAGNOSIS — K811 Chronic cholecystitis: Secondary | ICD-10-CM

## 2013-05-20 DIAGNOSIS — G8929 Other chronic pain: Secondary | ICD-10-CM

## 2013-05-20 NOTE — Patient Instructions (Addendum)
Patient to return as needed. 

## 2013-05-20 NOTE — Progress Notes (Signed)
Patient ID: Kathryn Farrell, female   DOB: 06-21-1961, 52 y.o.   MRN: [id number]  Chief Complaint  Patient presents with  . Routine Post Op    gallbladder    HPI Kathryn Farrell is a 52 y.o. female here today for her post op gallbladder done 05/07/13. Patient states she is doing well. The patient reports nausea she was experiencing prior to surgery is completely resolved. HPI  Past Medical History  Diagnosis Date  . GERD (gastroesophageal reflux disease)   . Neuromuscular disorder 2009    centralized pain syndrome/fibromylagia  . Osteoporosis   . C. difficile colitis 2008  . Colon polyps 2008  . Hiatal hernia 2014  . Chronic cholecystitis 05/07/2013    Past Surgical History  Procedure Laterality Date  . Abdominal hysterectomy  1989  . Arm surgery      "muscle came off the elbow"  . Cesarean section    . Spine surgery  2008    cervical fusion  . Knee arthroscopy  2004/2008    x2  . Upper gi endoscopy  04-27-13    Dr [name]  . Colonoscopy  2008    Family History  Problem Relation Age of Onset  . COPD Mother   . Asthma Mother   . Cancer Father     esophageal cander with mets  . Diabetes Brother   . Stroke Brother     Social History History  Substance Use Topics  . Smoking status: Former Smoker    Quit date: 10/28/1996  . Smokeless tobacco: Never Used  . Alcohol Use: No    Allergies  Allergen Reactions  . Lodine (Etodolac) Nausea And Vomiting  . Penicillins Other (See Comments)    Was a child; doesn't remember.  . Effexor (Venlafaxine Hydrochloride)     Makes her jittery    Current Outpatient Prescriptions  Medication Sig Dispense Refill  . ALPRAZolam (XANAX) 0.25 MG tablet Take 0.25 mg by mouth daily as needed.      . calcium-vitamin D (OSCAL WITH D) 500-200 MG-UNIT per tablet Take 1 tablet by mouth daily.      . citalopram (CELEXA) 10 MG tablet Take 1 tablet (10 mg total) by mouth daily.  30 tablet  4  . DiphenhydrAMINE HCl (BENADRYL ALLERGY PO) Take 12.5  mg by mouth at bedtime.      . fentaNYL (DURAGESIC - DOSED MCG/HR) 12 MCG/HR Place 1 patch (12.5 mcg total) onto the skin every 3 (three) days. Must last 30 days.  10 patch  0  . HYDROcodone-acetaminophen (NORCO) 7.5-325 MG per tablet TAKE ONE TABLET BY MOUTH EVERY 6 HOURS AS NEEDED FOR PAIN (MUST LAST 30 DAYS)  120 tablet  0  . methocarbamol (ROBAXIN) 500 MG tablet Take 1 tablet by mouth daily.      . ondansetron (ZOFRAN-ODT) 4 MG disintegrating tablet Take 1 tablet by mouth daily.      . pantoprazole (PROTONIX) 40 MG tablet Take 40 mg by mouth daily.      Marland Kitchen PREMARIN vaginal cream       . VALERIAN PO Take 3 tablets by mouth at bedtime.      . Zoledronic Acid (RECLAST IV) Inject into the vein. Once yearly       No current facility-administered medications for this visit.    Review of Systems Review of Systems  Respiratory: Negative.   Cardiovascular: Negative.     Blood pressure 120/74, pulse 76, resp. rate 12, height 5' (1.524 m), weight 138  lb (62.596 kg).  Physical Exam Physical Exam  Constitutional: She is oriented to person, place, and time. She appears well-developed and well-nourished.  Abdominal:  Port site are well healed.  Neurological: She is alert and oriented to person, place, and time.  Skin: Skin is warm and dry.    Data Reviewed Pathology showed chronic cholecystitis.  Assessment    Doing well status post cholecystectomy.     Plan    Followup will be on an as needed basis.        Earline Mayotte 05/21/2013, 1:53 PM

## 2013-05-21 ENCOUNTER — Encounter: Payer: Self-pay | Admitting: General Surgery

## 2013-06-09 ENCOUNTER — Encounter: Payer: 59 | Attending: Physical Medicine & Rehabilitation | Admitting: Physical Medicine & Rehabilitation

## 2013-06-09 ENCOUNTER — Encounter: Payer: Self-pay | Admitting: Physical Medicine & Rehabilitation

## 2013-06-09 VITALS — BP 111/66 | HR 73 | Resp 14 | Ht 60.0 in | Wt 138.0 lb

## 2013-06-09 DIAGNOSIS — M76899 Other specified enthesopathies of unspecified lower limb, excluding foot: Secondary | ICD-10-CM | POA: Insufficient documentation

## 2013-06-09 DIAGNOSIS — X58XXXS Exposure to other specified factors, sequela: Secondary | ICD-10-CM | POA: Insufficient documentation

## 2013-06-09 DIAGNOSIS — G890 Central pain syndrome: Secondary | ICD-10-CM | POA: Insufficient documentation

## 2013-06-09 DIAGNOSIS — Z79899 Other long term (current) drug therapy: Secondary | ICD-10-CM

## 2013-06-09 DIAGNOSIS — M47816 Spondylosis without myelopathy or radiculopathy, lumbar region: Secondary | ICD-10-CM

## 2013-06-09 DIAGNOSIS — M47812 Spondylosis without myelopathy or radiculopathy, cervical region: Secondary | ICD-10-CM | POA: Insufficient documentation

## 2013-06-09 DIAGNOSIS — M5412 Radiculopathy, cervical region: Secondary | ICD-10-CM | POA: Insufficient documentation

## 2013-06-09 DIAGNOSIS — M7060 Trochanteric bursitis, unspecified hip: Secondary | ICD-10-CM

## 2013-06-09 DIAGNOSIS — F341 Dysthymic disorder: Secondary | ICD-10-CM | POA: Insufficient documentation

## 2013-06-09 DIAGNOSIS — IMO0002 Reserved for concepts with insufficient information to code with codable children: Secondary | ICD-10-CM | POA: Insufficient documentation

## 2013-06-09 DIAGNOSIS — Z5181 Encounter for therapeutic drug level monitoring: Secondary | ICD-10-CM | POA: Insufficient documentation

## 2013-06-09 DIAGNOSIS — IMO0001 Reserved for inherently not codable concepts without codable children: Secondary | ICD-10-CM | POA: Insufficient documentation

## 2013-06-09 DIAGNOSIS — M47817 Spondylosis without myelopathy or radiculopathy, lumbosacral region: Secondary | ICD-10-CM | POA: Insufficient documentation

## 2013-06-09 DIAGNOSIS — M1710 Unilateral primary osteoarthritis, unspecified knee: Secondary | ICD-10-CM | POA: Insufficient documentation

## 2013-06-09 DIAGNOSIS — M961 Postlaminectomy syndrome, not elsewhere classified: Secondary | ICD-10-CM | POA: Insufficient documentation

## 2013-06-09 MED ORDER — FENTANYL 25 MCG/HR TD PT72: 1.0000 | MEDICATED_PATCH | TRANSDERMAL | Status: DC

## 2013-06-09 MED ORDER — HYDROCODONE-ACETAMINOPHEN 7.5-325 MG PO TABS: ORAL_TABLET | ORAL | Status: DC

## 2013-06-09 NOTE — Patient Instructions (Signed)
CONTINUE TO WORK ON AN EXERCISE ROUTINE TO BUILD UP PHYSICAL AND AEROBIC CONDITIONING.   WORK ON POSTURE.

## 2013-06-09 NOTE — Progress Notes (Signed)
Subjective:    Patient ID: Kathryn Farrell, female    DOB: 1961-04-29, 52 y.o.   MRN: [id number]  HPI  Kathryn Farrell is back regarding her chronic pain syndrome. She has been quite active over the summer with her grandchildren. Some of these activities exacerbated her pain. She still struggles at time with her energy and generalized pain. She has had more pain in her low back and right knee at times. She feels that she needs to "take a pain pill" before she exercises or does something for an extended period of time.  Her GB was removed and he GI symptoms have improved. She is concerned about the fact that she's gained weight. She is fairly self-conscious about it infact. Kathryn Farrell tells me that her stressors in general are improved and that she tends to be able to deal with stress better these days.   Pain Inventory Average Pain 7 Pain Right Now 7 My pain is sharp, burning, dull, stabbing, tingling and aching  In the last 24 hours, has pain interfered with the following? General activity 7 Relation with others 7 Enjoyment of life 7 What TIME of day is your pain at its worst? varies Sleep (in general) Fair  Pain is worse with: walking, bending, sitting and inactivity Pain improves with: medication, TENS and injections Relief from Meds: 7  Mobility walk without assistance use a cane how many minutes can you walk? 30 ability to climb steps?  yes Do you have any goals in this area?  yes  Function disabled: date disabled .  Neuro/Psych numbness tingling confusion depression anxiety  Prior Studies Any changes since last visit?  yes gallbladder removed  Physicians involved in your care Dr Berkley Harvey   Family History  Problem Relation Age of Onset  . COPD Mother   . Asthma Mother   . Cancer Father     esophageal cander with mets  . Diabetes Brother   . Stroke Brother    History   Social History  . Marital Status: Married    Spouse Name: N/A    Number of Children: N/A  . Years  of Education: N/A   Social History Main Topics  . Smoking status: Former Smoker    Quit date: 10/28/1996  . Smokeless tobacco: Never Used  . Alcohol Use: No  . Drug Use: No  . Sexual Activity: None   Other Topics Concern  . None   Social History Narrative  . None   Past Surgical History  Procedure Laterality Date  . Abdominal hysterectomy  1989  . Arm surgery      "muscle came off the elbow"  . Cesarean section    . Spine surgery  2008    cervical fusion  . Knee arthroscopy  2004/2008    x2  . Upper gi endoscopy  04-27-13    Dr [name]  . Colonoscopy  2008  . Cholecystectomy     Past Medical History  Diagnosis Date  . GERD (gastroesophageal reflux disease)   . Neuromuscular disorder 2009    centralized pain syndrome/fibromylagia  . Osteoporosis   . C. difficile colitis 2008  . Colon polyps 2008  . Hiatal hernia 2014  . Chronic cholecystitis 05/07/2013   BP 111/66  Pulse 73  Resp 14  Ht 5' (1.524 m)  Wt 138 lb (62.596 kg)  BMI 26.95 kg/m2  SpO2 97%     Review of Systems  Constitutional: Positive for unexpected weight change.  Musculoskeletal: Positive for back pain.  Neurological: Positive for numbness.  Psychiatric/Behavioral: Positive for confusion and dysphoric mood. The patient is nervous/anxious.   All other systems reviewed and are negative.       Objective:   Physical Exam Constitutional: She is oriented to person, place, and time. She appears well-developed. She may have gained a little weight.  Eyes: Conjunctivae and EOM are normal. Pupils are equal, round, and reactive to light.  Neck: Normal range of motion.  Cardiovascular: Normal rate and regular rhythm.  Pulmonary/Chest: Effort normal.  Abdominal: Soft.  Neurological: She is alert and oriented to person, place, and time. dtr's are 1-2 +. Strength in all 4 limbs intact at 5/5. Spurling's maneuver remains equivocal to negative on the right and negative on the left. Extension pain noted and  also Mild pain in the neck was noted with palpation of the paraspinals. No spasm was appreciated. DTR's were all 2+. Thoracic spine was notable for spasm along the bilateral paraspinal-rhomboid area. She has tenderness with palpation of the spinous processes at that level. Low back was slightly tender to touch. spasm was appreciated in predominanty the right lumbar paraspinals. Flexion caused some discomfort. She was able to bend and touch toes. Extension continues to cause the most pain, positive facet maneuvers to the left and right. She had pain with bending to the left as well. SLR maneuver to the left was equivocal. Posture overall was fair.  Patient's weight is stable. She ambulates without any obvious antalgia. She transfers easily. She does have some mild crepitus at the right knee. No knee instability is appreciated. No significant edema is seen at the right knee.  Psych: she pleasant And appropriate.  Assessment & Plan:   ASSESSMENT:  1. Centralized pain syndrome/ FMS  2. Lumbar spondylosis. With facet and disc disease. Left L5 radiculopathy (improved)  3. Bilateral greater trochanter bursitis.  4. Osteoarthritis, right knee.  5. Right forearm extensor mechanism injury.  6. 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  7. Insomnia  8. Depression.    PLAN:  1. Continue DHEA and melatonin for sleep and pain as well as coQ10.  2. Discussed constructive mgt of stress and the importance of posture and regular exercise as it pertains to her pain levels, activity tolerance, mood, etc.  3. Hydrocodone for breakthrough pain. rf rx written   4. Increase fentanyl patch to for base line pain. rf rx written  5. Continue trileptal 150mg  BID for FMS pain.  6. Trazodone for sleep (along with melatonin), 50mg  qhs. Ultimately her sleep will improve further with pain control as well.  8. Needs to remain out of work for now.  9. Medrol dose pack was written today for  acute flare  10. Follow up with me in about 2 months. She may pick up rf next month. 30 minutes of face to face patient care time were spent during this visit. All questions were encouraged and answered.

## 2013-07-06 ENCOUNTER — Telehealth: Payer: Self-pay

## 2013-07-06 DIAGNOSIS — G890 Central pain syndrome: Secondary | ICD-10-CM

## 2013-07-06 DIAGNOSIS — F341 Dysthymic disorder: Secondary | ICD-10-CM

## 2013-07-06 DIAGNOSIS — M47817 Spondylosis without myelopathy or radiculopathy, lumbosacral region: Secondary | ICD-10-CM

## 2013-07-06 DIAGNOSIS — Z79899 Other long term (current) drug therapy: Secondary | ICD-10-CM

## 2013-07-06 DIAGNOSIS — Z5181 Encounter for therapeutic drug level monitoring: Secondary | ICD-10-CM

## 2013-07-06 DIAGNOSIS — M76899 Other specified enthesopathies of unspecified lower limb, excluding foot: Secondary | ICD-10-CM

## 2013-07-06 DIAGNOSIS — IMO0001 Reserved for inherently not codable concepts without codable children: Secondary | ICD-10-CM

## 2013-07-06 DIAGNOSIS — M47816 Spondylosis without myelopathy or radiculopathy, lumbar region: Secondary | ICD-10-CM

## 2013-07-06 DIAGNOSIS — M1710 Unilateral primary osteoarthritis, unspecified knee: Secondary | ICD-10-CM

## 2013-07-06 DIAGNOSIS — M961 Postlaminectomy syndrome, not elsewhere classified: Secondary | ICD-10-CM

## 2013-07-06 DIAGNOSIS — M47812 Spondylosis without myelopathy or radiculopathy, cervical region: Secondary | ICD-10-CM

## 2013-07-06 DIAGNOSIS — M7060 Trochanteric bursitis, unspecified hip: Secondary | ICD-10-CM

## 2013-07-06 DIAGNOSIS — M5412 Radiculopathy, cervical region: Secondary | ICD-10-CM

## 2013-07-06 NOTE — Telephone Encounter (Signed)
Patient called to let us know she only has 2 patches left.

## 2013-07-06 NOTE — Telephone Encounter (Signed)
Send back to pool

## 2013-07-07 MED ORDER — FENTANYL 25 MCG/HR TD PT72: 1.0000 | MEDICATED_PATCH | TRANSDERMAL | Status: DC

## 2013-07-07 NOTE — Telephone Encounter (Signed)
Fentanyl refill printed.  Patient aware it will be ready Friday.

## 2013-07-14 ENCOUNTER — Telehealth: Payer: Self-pay

## 2013-07-14 DIAGNOSIS — M1710 Unilateral primary osteoarthritis, unspecified knee: Secondary | ICD-10-CM

## 2013-07-14 DIAGNOSIS — M179 Osteoarthritis of knee, unspecified: Secondary | ICD-10-CM

## 2013-07-14 DIAGNOSIS — M961 Postlaminectomy syndrome, not elsewhere classified: Secondary | ICD-10-CM

## 2013-07-14 DIAGNOSIS — G890 Central pain syndrome: Secondary | ICD-10-CM

## 2013-07-14 DIAGNOSIS — IMO0001 Reserved for inherently not codable concepts without codable children: Secondary | ICD-10-CM

## 2013-07-14 DIAGNOSIS — M47816 Spondylosis without myelopathy or radiculopathy, lumbar region: Secondary | ICD-10-CM

## 2013-07-14 DIAGNOSIS — M7060 Trochanteric bursitis, unspecified hip: Secondary | ICD-10-CM

## 2013-07-14 DIAGNOSIS — Z5181 Encounter for therapeutic drug level monitoring: Secondary | ICD-10-CM

## 2013-07-14 DIAGNOSIS — Z79899 Other long term (current) drug therapy: Secondary | ICD-10-CM

## 2013-07-14 DIAGNOSIS — F341 Dysthymic disorder: Secondary | ICD-10-CM

## 2013-07-14 DIAGNOSIS — M47817 Spondylosis without myelopathy or radiculopathy, lumbosacral region: Secondary | ICD-10-CM

## 2013-07-14 DIAGNOSIS — M47812 Spondylosis without myelopathy or radiculopathy, cervical region: Secondary | ICD-10-CM

## 2013-07-14 DIAGNOSIS — M76899 Other specified enthesopathies of unspecified lower limb, excluding foot: Secondary | ICD-10-CM

## 2013-07-14 DIAGNOSIS — IMO0002 Reserved for concepts with insufficient information to code with codable children: Secondary | ICD-10-CM

## 2013-07-14 DIAGNOSIS — M5412 Radiculopathy, cervical region: Secondary | ICD-10-CM

## 2013-07-14 NOTE — Telephone Encounter (Signed)
Patient called and said that [facility] is sending over a refill request for her Hydrocodone. Refill is not due until Monday but patient is going on vacation.

## 2013-07-15 ENCOUNTER — Other Ambulatory Visit: Payer: Self-pay | Admitting: *Deleted

## 2013-07-15 DIAGNOSIS — M47816 Spondylosis without myelopathy or radiculopathy, lumbar region: Secondary | ICD-10-CM

## 2013-07-15 DIAGNOSIS — Z5181 Encounter for therapeutic drug level monitoring: Secondary | ICD-10-CM

## 2013-07-15 DIAGNOSIS — M7060 Trochanteric bursitis, unspecified hip: Secondary | ICD-10-CM

## 2013-07-15 DIAGNOSIS — IMO0001 Reserved for inherently not codable concepts without codable children: Secondary | ICD-10-CM

## 2013-07-15 DIAGNOSIS — M179 Osteoarthritis of knee, unspecified: Secondary | ICD-10-CM

## 2013-07-15 DIAGNOSIS — M961 Postlaminectomy syndrome, not elsewhere classified: Secondary | ICD-10-CM

## 2013-07-15 DIAGNOSIS — M1710 Unilateral primary osteoarthritis, unspecified knee: Secondary | ICD-10-CM

## 2013-07-15 DIAGNOSIS — G890 Central pain syndrome: Secondary | ICD-10-CM

## 2013-07-15 DIAGNOSIS — F341 Dysthymic disorder: Secondary | ICD-10-CM

## 2013-07-15 DIAGNOSIS — M5412 Radiculopathy, cervical region: Secondary | ICD-10-CM

## 2013-07-15 DIAGNOSIS — M76899 Other specified enthesopathies of unspecified lower limb, excluding foot: Secondary | ICD-10-CM

## 2013-07-15 DIAGNOSIS — Z79899 Other long term (current) drug therapy: Secondary | ICD-10-CM

## 2013-07-15 DIAGNOSIS — M47817 Spondylosis without myelopathy or radiculopathy, lumbosacral region: Secondary | ICD-10-CM

## 2013-07-15 DIAGNOSIS — M47812 Spondylosis without myelopathy or radiculopathy, cervical region: Secondary | ICD-10-CM

## 2013-07-15 DIAGNOSIS — IMO0002 Reserved for concepts with insufficient information to code with codable children: Secondary | ICD-10-CM

## 2013-07-15 MED ORDER — HYDROCODONE-ACETAMINOPHEN 7.5-325 MG PO TABS: ORAL_TABLET | ORAL | Status: DC

## 2013-07-15 NOTE — Telephone Encounter (Signed)
error 

## 2013-07-15 NOTE — Telephone Encounter (Signed)
Hydrocodone called in to [facility].

## 2013-07-23 ENCOUNTER — Telehealth: Payer: Self-pay | Admitting: Physical Medicine & Rehabilitation

## 2013-07-23 NOTE — Telephone Encounter (Signed)
Who is Dr. Maisie Fus, and when can I contact him next week?

## 2013-07-23 NOTE — Telephone Encounter (Signed)
Dr. Maisie Fus would like for you to call him in regards to her long term disability paperwork you have filled out on her.  Please call him at [phone] (he will be at lunch between 12-1).

## 2013-07-23 NOTE — Telephone Encounter (Signed)
Dr. Maisie Fus is an internist with disability and said that you can call him any next week Monday or Tuesday are good days for him.

## 2013-08-04 ENCOUNTER — Encounter: Payer: 59 | Attending: Physical Medicine & Rehabilitation | Admitting: Physical Medicine & Rehabilitation

## 2013-08-04 ENCOUNTER — Encounter: Payer: Self-pay | Admitting: Physical Medicine & Rehabilitation

## 2013-08-04 VITALS — BP 104/71 | HR 76 | Resp 14 | Ht 60.0 in | Wt 133.0 lb

## 2013-08-04 DIAGNOSIS — M179 Osteoarthritis of knee, unspecified: Secondary | ICD-10-CM

## 2013-08-04 DIAGNOSIS — G4700 Insomnia, unspecified: Secondary | ICD-10-CM

## 2013-08-04 DIAGNOSIS — X58XXXS Exposure to other specified factors, sequela: Secondary | ICD-10-CM | POA: Insufficient documentation

## 2013-08-04 DIAGNOSIS — Z5181 Encounter for therapeutic drug level monitoring: Secondary | ICD-10-CM

## 2013-08-04 DIAGNOSIS — M7060 Trochanteric bursitis, unspecified hip: Secondary | ICD-10-CM

## 2013-08-04 DIAGNOSIS — IMO0002 Reserved for concepts with insufficient information to code with codable children: Secondary | ICD-10-CM

## 2013-08-04 DIAGNOSIS — M5412 Radiculopathy, cervical region: Secondary | ICD-10-CM

## 2013-08-04 DIAGNOSIS — M1710 Unilateral primary osteoarthritis, unspecified knee: Secondary | ICD-10-CM | POA: Insufficient documentation

## 2013-08-04 DIAGNOSIS — M47812 Spondylosis without myelopathy or radiculopathy, cervical region: Secondary | ICD-10-CM

## 2013-08-04 DIAGNOSIS — F418 Other specified anxiety disorders: Secondary | ICD-10-CM

## 2013-08-04 DIAGNOSIS — Z79899 Other long term (current) drug therapy: Secondary | ICD-10-CM

## 2013-08-04 DIAGNOSIS — M47816 Spondylosis without myelopathy or radiculopathy, lumbar region: Secondary | ICD-10-CM

## 2013-08-04 DIAGNOSIS — IMO0001 Reserved for inherently not codable concepts without codable children: Secondary | ICD-10-CM

## 2013-08-04 DIAGNOSIS — M961 Postlaminectomy syndrome, not elsewhere classified: Secondary | ICD-10-CM

## 2013-08-04 DIAGNOSIS — G890 Central pain syndrome: Secondary | ICD-10-CM

## 2013-08-04 DIAGNOSIS — M76899 Other specified enthesopathies of unspecified lower limb, excluding foot: Secondary | ICD-10-CM

## 2013-08-04 DIAGNOSIS — F341 Dysthymic disorder: Secondary | ICD-10-CM

## 2013-08-04 DIAGNOSIS — M47817 Spondylosis without myelopathy or radiculopathy, lumbosacral region: Secondary | ICD-10-CM

## 2013-08-04 MED ORDER — FENTANYL 25 MCG/HR TD PT72: 1.0000 | MEDICATED_PATCH | TRANSDERMAL | Status: DC

## 2013-08-04 MED ORDER — HYDROCODONE-ACETAMINOPHEN 7.5-325 MG PO TABS: ORAL_TABLET | ORAL | Status: DC

## 2013-08-04 NOTE — Progress Notes (Signed)
Subjective:    Patient ID: Kathryn Farrell, female    DOB: September 20, 1961, 52 y.o.   MRN: [id number]  HPI  Kathryn Farrell is back regarding her chronic pain. She continues to do fairly well with the fentanyl patch. She has her good and bad days. The cooler weather affects her and increases her pain. She tries to get some basic exercise and walking in but is often limited.   She reports that her sister was dx'ed with RA and she is wondering whether we should check labwork again. i believe we screened her in the past although I cannot find any labwork on our system.  Over the last few weeks her right knee has begun to act up again. She is having increased difficulty with stairs, notes that the knee will lock up, pop, and swell, and in generally she is having decreased flexibility.    Pain Inventory Average Pain 7 Pain Right Now 7 My pain is sharp, burning, stabbing, tingling and aching  In the last 24 hours, has pain interfered with the following? General activity 7 Relation with others 7 Enjoyment of life 7 What TIME of day is your pain at its worst? all Sleep (in general) Fair  Pain is worse with: walking, bending, sitting and standing Pain improves with: heat/ice, medication, TENS and injections Relief from Meds: 6  Mobility walk without assistance ability to climb steps?  no do you drive?  yes  Function disabled: date disabled .  Neuro/Psych numbness tingling spasms dizziness confusion depression anxiety  Prior Studies Any changes since last visit?  no  Physicians involved in your care Any changes since last visit?  no   Family History  Problem Relation Age of Onset  . COPD Mother   . Asthma Mother   . Cancer Father     esophageal cander with mets  . Diabetes Brother   . Stroke Brother    History   Social History  . Marital Status: Married    Spouse Name: N/A    Number of Children: N/A  . Years of Education: N/A   Social History Main Topics  . Smoking  status: Former Smoker    Quit date: 10/28/1996  . Smokeless tobacco: Never Used  . Alcohol Use: No  . Drug Use: No  . Sexual Activity: None   Other Topics Concern  . None   Social History Narrative  . None   Past Surgical History  Procedure Laterality Date  . Abdominal hysterectomy  1989  . Arm surgery      "muscle came off the elbow"  . Cesarean section    . Spine surgery  2008    cervical fusion  . Knee arthroscopy  2004/2008    x2  . Upper gi endoscopy  04-27-13    Dr [name]  . Colonoscopy  2008  . Cholecystectomy     Past Medical History  Diagnosis Date  . GERD (gastroesophageal reflux disease)   . Neuromuscular disorder 2009    centralized pain syndrome/fibromylagia  . Osteoporosis   . C. difficile colitis 2008  . Colon polyps 2008  . Hiatal hernia 2014  . Chronic cholecystitis 05/07/2013   BP 104/71  Pulse 76  Resp 14  Ht 5' (1.524 m)  Wt 133 lb (60.328 kg)  BMI 25.97 kg/m2  SpO2 95%   Review of Systems  Gastrointestinal: Positive for nausea.  Musculoskeletal: Positive for myalgias.       Spasms  Neurological: Positive for dizziness and numbness.  Tingling  Psychiatric/Behavioral: Positive for confusion and dysphoric mood. The patient is nervous/anxious.   All other systems reviewed and are negative.       Objective:   Physical Exam Constitutional: She is oriented to person, place, and time. She appears well-developed. She may have gained a little weight.  Eyes: Conjunctivae and EOM are normal. Pupils are equal, round, and reactive to light.  Neck: Normal range of motion.  Cardiovascular: Normal rate and regular rhythm.  Pulmonary/Chest: Effort normal.  Abdominal: Soft.  Neurological: She is alert and oriented to person, place, and time. dtr's are 1-2 +. Strength in all 4 limbs intact at 5/5. Spurling's maneuver remains equivocal to negative on the right and negative on the left. Extension pain noted and also DTR's were all 2+. Thoracic  spine was notable for spasm along the bilateral paraspinal-rhomboid area. She has tenderness with palpation of the spinous processes at that level. Low back was slightly tender to touch. spasm was appreciated in predominanty the right lumbar paraspinals.   Posture overall was fair.  Patient's weight is stable. She ambulates with antalgia over the right knee. . She transfers easily. She does have some   crepitus at the right knee with lateral joint line pain noted. McMurray's test positive right lateral knee. Small joint effusion is noted laterally and inferiorly. . No knee instability is appreciated.  Psych: she pleasant And appropriate.  Assessment & Plan:   ASSESSMENT:  1. Centralized pain syndrome/ FMS  2. Lumbar spondylosis. With facet and disc disease. Left L5 radiculopathy (improved)  3. Bilateral greater trochanter bursitis.  4. Osteoarthritis, right knee.  5. Right forearm extensor mechanism injury.  6. 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  7. Insomnia  8. Depression.    PLAN:  1. Continue DHEA and melatonin for sleep and pain as well as coQ10.  2. After informed consent and preparation of the skin with betadine and isopropyl alcohol, I injected 6mg  celestone and 4cc of 1% lidocaine into the right knee via lateral approach. The patient tolerated well, and no complications were encountered. Afterward the area was cleaned and dressed. Post- injection instructions were provided. She may want to follow up with her orthopedic surgeon regarding the need for surgical intervention this coming year if her knee doesn't begin to settle down.  3. Hydrocodone for breakthrough pain. rf rx written  4. Continue fentanyl patch to for base line pain. rf rx written  5. Continue trileptal 150mg  BID for FMS pain.  6. Trazodone for sleep (along with melatonin), 50mg  qhs.  8. Recommend that we recheck a sed rate, RF, ANA, CBC given that her sister was dx'ed with RA.   9. Given her multiple pain issues above, I don't feel that Kirbie can return to work. I would like her to remain as active as possible with basic exercise and stretching at home to tolerance.  10. Follow up with me in about 2 months. She may pick up rf next month. 30 minutes of face to face patient care time were spent during this visit. All questions were encouraged and answered.

## 2013-08-04 NOTE — Patient Instructions (Signed)
CALL ME WITH ANY PROBLEMS OR QUESTIONS (#[phone]).  HAVE A GOOD DAY  

## 2013-09-02 ENCOUNTER — Other Ambulatory Visit: Payer: Self-pay

## 2013-09-07 ENCOUNTER — Telehealth: Payer: Self-pay | Admitting: *Deleted

## 2013-09-07 DIAGNOSIS — F418 Other specified anxiety disorders: Secondary | ICD-10-CM

## 2013-09-07 MED ORDER — OXCARBAZEPINE 150 MG PO TABS: 150.0000 mg | ORAL_TABLET | Freq: Two times a day (BID) | ORAL | Status: DC

## 2013-09-07 NOTE — Telephone Encounter (Signed)
Pharmacy is requesting refill of citalopram.  We have not prescribed it since 09/2012 with 4 refills and I did not see mention of it in notes from visits. Do you want to refill?

## 2013-09-08 MED ORDER — CITALOPRAM HYDROBROMIDE 10 MG PO TABS: 10.0000 mg | ORAL_TABLET | Freq: Every day | ORAL | Status: DC

## 2013-09-08 NOTE — Telephone Encounter (Signed)
Patient came to office today requesting a back injections.  Please advise of what injection to put patient in as and if she can have one.

## 2013-09-08 NOTE — Telephone Encounter (Signed)
i am not opposed to refilling it, but find out from the patient why it hasn't been re-filled before now.  thanks

## 2013-09-08 NOTE — Telephone Encounter (Signed)
Citalopram refilled.  Please advise whether back injection is appropriate and what kind?

## 2013-09-08 NOTE — Telephone Encounter (Signed)
I had no plans for imminent back injections. i just injected her knee at last visit.    My last question was not answered (see below)

## 2013-09-08 NOTE — Telephone Encounter (Signed)
Per patient she taking her citalopram regularly.  Patient said she had spoke with Dr Wynn Banker about injecitons in her upper back.  She says she will discuss this at her next appointment.  Offered patient an earlier appointment but she declined.

## 2013-09-27 ENCOUNTER — Other Ambulatory Visit: Payer: Self-pay

## 2013-09-27 MED ORDER — OXCARBAZEPINE 150 MG PO TABS: 150.0000 mg | ORAL_TABLET | Freq: Two times a day (BID) | ORAL | Status: DC

## 2013-09-29 ENCOUNTER — Encounter: Payer: 59 | Attending: Physical Medicine & Rehabilitation | Admitting: Physical Medicine & Rehabilitation

## 2013-09-29 ENCOUNTER — Encounter: Payer: Self-pay | Admitting: Physical Medicine & Rehabilitation

## 2013-09-29 VITALS — BP 93/66 | HR 83 | Resp 14 | Ht 60.0 in | Wt 135.0 lb

## 2013-09-29 DIAGNOSIS — Z5181 Encounter for therapeutic drug level monitoring: Secondary | ICD-10-CM | POA: Insufficient documentation

## 2013-09-29 DIAGNOSIS — M1710 Unilateral primary osteoarthritis, unspecified knee: Secondary | ICD-10-CM | POA: Insufficient documentation

## 2013-09-29 DIAGNOSIS — Z79899 Other long term (current) drug therapy: Secondary | ICD-10-CM | POA: Insufficient documentation

## 2013-09-29 DIAGNOSIS — X58XXXS Exposure to other specified factors, sequela: Secondary | ICD-10-CM | POA: Insufficient documentation

## 2013-09-29 DIAGNOSIS — M47816 Spondylosis without myelopathy or radiculopathy, lumbar region: Secondary | ICD-10-CM

## 2013-09-29 DIAGNOSIS — G890 Central pain syndrome: Secondary | ICD-10-CM | POA: Insufficient documentation

## 2013-09-29 DIAGNOSIS — M47817 Spondylosis without myelopathy or radiculopathy, lumbosacral region: Secondary | ICD-10-CM

## 2013-09-29 DIAGNOSIS — F341 Dysthymic disorder: Secondary | ICD-10-CM | POA: Insufficient documentation

## 2013-09-29 DIAGNOSIS — M5412 Radiculopathy, cervical region: Secondary | ICD-10-CM | POA: Insufficient documentation

## 2013-09-29 DIAGNOSIS — IMO0002 Reserved for concepts with insufficient information to code with codable children: Secondary | ICD-10-CM | POA: Insufficient documentation

## 2013-09-29 DIAGNOSIS — IMO0001 Reserved for inherently not codable concepts without codable children: Secondary | ICD-10-CM | POA: Insufficient documentation

## 2013-09-29 DIAGNOSIS — M961 Postlaminectomy syndrome, not elsewhere classified: Secondary | ICD-10-CM | POA: Insufficient documentation

## 2013-09-29 DIAGNOSIS — M76899 Other specified enthesopathies of unspecified lower limb, excluding foot: Secondary | ICD-10-CM | POA: Insufficient documentation

## 2013-09-29 DIAGNOSIS — M7060 Trochanteric bursitis, unspecified hip: Secondary | ICD-10-CM

## 2013-09-29 DIAGNOSIS — M47812 Spondylosis without myelopathy or radiculopathy, cervical region: Secondary | ICD-10-CM | POA: Insufficient documentation

## 2013-09-29 MED ORDER — HYDROCODONE-ACETAMINOPHEN 7.5-325 MG PO TABS: ORAL_TABLET | ORAL | Status: DC

## 2013-09-29 MED ORDER — FENTANYL 25 MCG/HR TD PT72: 25.0000 ug | MEDICATED_PATCH | TRANSDERMAL | Status: DC

## 2013-09-29 MED ORDER — CITALOPRAM HYDROBROMIDE 20 MG PO TABS: 20.0000 mg | ORAL_TABLET | Freq: Every day | ORAL | Status: DC

## 2013-09-29 MED ORDER — TIZANIDINE HCL 2 MG PO CAPS: 2.0000 mg | ORAL_CAPSULE | Freq: Three times a day (TID) | ORAL | Status: DC | PRN

## 2013-09-29 NOTE — Progress Notes (Signed)
Subjective:    Patient ID: Kathryn Farrell, female    DOB: July 01, 1961, 52 y.o.   MRN: [id number]  HPI  Kathryn Farrell is back regarding her chronic pain. She has had more pain over the last few weeks. She feels that her spine has been "killing" her. She recently had a kidney stone which caused a lot of pain. She was also placed on cipro for a potenial UTI.   Overall she feels that she's able to do less. Simple activities such as standing in the kitchen and cooking a meal tend to fatigue her and cause pain. She feels very limited in her ability to exercise, and often doesn't even want to walk in fear of increasing her pain.   Furthermore, depression has been more of an issue, and she wonders what can be done to help with this.      Pain Inventory Average Pain 9 Pain Right Now 9 My pain is constant, sharp, burning, dull, stabbing, tingling and aching  In the last 24 hours, has pain interfered with the following? General activity 9 Relation with others 9 Enjoyment of life 9 What TIME of day is your pain at its worst? constant Sleep (in general) Poor  Pain is worse with: walking, bending, sitting, inactivity, standing and some activites Pain improves with: rest, heat/ice, medication, TENS and injections Relief from Meds: 6  Mobility use a cane ability to climb steps?  no do you drive?  yes  Function disabled: date disabled 10/2012 I need assistance with the following:  meal prep, household duties and shopping  Neuro/Psych weakness numbness tingling trouble walking spasms dizziness confusion depression anxiety  Prior Studies bone scan x-rays CT/MRI nerve study  Physicians involved in your care Dr Marcello Fennel, Dr Carolynne Edouard,   Family History  Problem Relation Age of Onset  . COPD Mother   . Asthma Mother   . Cancer Father     esophageal cander with mets  . Diabetes Brother   . Stroke Brother    History   Social History  . Marital Status: Married    Spouse Name: N/A    Number of Children: N/A  . Years of Education: N/A   Social History Main Topics  . Smoking status: Former Smoker    Quit date: 10/28/1996  . Smokeless tobacco: Never Used  . Alcohol Use: No  . Drug Use: No  . Sexual Activity: None   Other Topics Concern  . None   Social History Narrative  . None   Past Surgical History  Procedure Laterality Date  . Abdominal hysterectomy  1989  . Arm surgery      "muscle came off the elbow"  . Cesarean section    . Spine surgery  2008    cervical fusion  . Knee arthroscopy  2004/2008    x2  . Upper gi endoscopy  04-27-13    Dr [name]  . Colonoscopy  2008  . Cholecystectomy     Past Medical History  Diagnosis Date  . GERD (gastroesophageal reflux disease)   . Neuromuscular disorder 2009    centralized pain syndrome/fibromylagia  . Osteoporosis   . C. difficile colitis 2008  . Colon polyps 2008  . Hiatal hernia 2014  . Chronic cholecystitis 05/07/2013   BP 93/66  Pulse 83  Resp 14  Ht 5' (1.524 m)  Wt 135 lb (61.236 kg)  BMI 26.37 kg/m2  SpO2 95%     Review of Systems  Respiratory: Positive for shortness of breath.  Gastrointestinal: Positive for nausea and diarrhea.  Musculoskeletal: Positive for arthralgias, gait problem and myalgias.  Neurological: Positive for dizziness, weakness and numbness.  Psychiatric/Behavioral: Positive for confusion and dysphoric mood. The patient is nervous/anxious.   All other systems reviewed and are negative.       Objective:   Physical Exam  Constitutional: She is oriented to person, place, and time. She appears well-developed. She may have gained a little weight.  Eyes: Conjunctivae and EOM are normal. Pupils are equal, round, and reactive to light.  Neck: Normal range of motion.  Cardiovascular: Normal rate and regular rhythm.  Pulmonary/Chest: Effort normal.  Abdominal: Soft.  Neurological: She is alert and oriented to person, place, and time. dtr's are 1-2 +. Strength in all  4 limbs intact at 5/5. Spurling's maneuver remains equivocal to negative on the right and negative on the left. Extension pain noted and also DTR's were all 2+. Thoracic spine was notable for spasm along the bilateral paraspinal-rhomboid area. She has tenderness with palpation of the spinous processes at that level. Low back was  tender to touch as well with spasm still appreciated in predominanty the right lumbar paraspinals. Posture overall was fair but more head-forward today Patient's weight is stable. She ambulates with antalgia over the right knee.    She does have some crepitus at the right knee with lateral joint line pain noted. McMurray's test positive right lateral knee. Small joint effusion is noted laterally and inferiorly. . No knee instability is appreciated.  Psych: she appears more anxious and depressed today.  Assessment & Plan:   ASSESSMENT:  1. Centralized pain syndrome/ FMS  2. Lumbar spondylosis. With facet and disc disease. Left L5 radiculopathy (improved)  3. Bilateral greater trochanter bursitis.  4. Osteoarthritis, right knee.  5. Right forearm extensor mechanism injury.  6. 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  7. Insomnia  8. Depression.   PLAN:  1. Continue DHEA and melatonin for sleep and pain as well as coQ10.  2. Increased celexa to 20mg  qhs. Challenged the patient to pursue more leisure and exercise activities to help with her pain. She has a hard time coping with the fact that she is unable to do what she once did from an exercise standpoint.   3. Hydrocodone for breakthrough pain. rf rx written  4. Continue fentanyl patch to for base line pain. rf rx written  5. We decided to stop trileptal as I am unsure she's really having a benefit.  6. Trazodone for sleep (along with melatonin), 50mg  qhs.  8. Rheum appt has been made..  9. Try zanaflex for muscle spasm in the thoracic and lumbar spine. Dc robaxin 10. Follow up  with me in about 2 months. She may pick up rf next month. 30 minutes of face to face patient care time were spent during this visit. All questions were encouraged and answered.

## 2013-09-29 NOTE — Patient Instructions (Addendum)
Please work on regular stretching, heat, massage, and EXERCISE to help with your pain and use as an outlet for emotions.

## 2013-10-04 ENCOUNTER — Telehealth: Payer: Self-pay

## 2013-10-04 NOTE — Telephone Encounter (Signed)
Patient called regarding a medication she was told to stop taking but got it deliever from [facility]. (oxcarbazine?)

## 2013-10-04 NOTE — Telephone Encounter (Signed)
Informed patient that trileptal was refilled due to office protocol and last office note.

## 2013-10-06 DIAGNOSIS — M794 Hypertrophy of (infrapatellar) fat pad: Secondary | ICD-10-CM | POA: Insufficient documentation

## 2013-10-06 DIAGNOSIS — M7060 Trochanteric bursitis, unspecified hip: Secondary | ICD-10-CM | POA: Insufficient documentation

## 2013-10-13 ENCOUNTER — Telehealth: Payer: Self-pay

## 2013-10-13 NOTE — Telephone Encounter (Signed)
Patient called to let us know that the tizanadine causes her to feel light headed, anxious and makes her heart feel like its racing.  She has stopped the medication but wanted to make Kathryn Farrell aware.

## 2013-10-25 ENCOUNTER — Telehealth: Payer: Self-pay

## 2013-10-25 NOTE — Telephone Encounter (Signed)
Patient called to let us know that she is having knee surgery 10/26/2013.  She will try to keep follow up appointment, but she is needing surgery on her other knee.

## 2013-11-10 ENCOUNTER — Encounter: Payer: 59 | Admitting: Physical Medicine & Rehabilitation

## 2013-11-15 ENCOUNTER — Encounter: Payer: 59 | Attending: Physical Medicine & Rehabilitation | Admitting: Physical Medicine & Rehabilitation

## 2013-11-15 ENCOUNTER — Encounter: Payer: Self-pay | Admitting: Physical Medicine & Rehabilitation

## 2013-11-15 VITALS — BP 114/66 | HR 77 | Resp 14 | Ht 60.0 in | Wt 138.0 lb

## 2013-11-15 DIAGNOSIS — M1710 Unilateral primary osteoarthritis, unspecified knee: Secondary | ICD-10-CM

## 2013-11-15 DIAGNOSIS — M7060 Trochanteric bursitis, unspecified hip: Secondary | ICD-10-CM

## 2013-11-15 DIAGNOSIS — M76899 Other specified enthesopathies of unspecified lower limb, excluding foot: Secondary | ICD-10-CM

## 2013-11-15 DIAGNOSIS — F418 Other specified anxiety disorders: Secondary | ICD-10-CM

## 2013-11-15 DIAGNOSIS — M961 Postlaminectomy syndrome, not elsewhere classified: Secondary | ICD-10-CM

## 2013-11-15 DIAGNOSIS — G890 Central pain syndrome: Secondary | ICD-10-CM

## 2013-11-15 DIAGNOSIS — IMO0002 Reserved for concepts with insufficient information to code with codable children: Secondary | ICD-10-CM

## 2013-11-15 DIAGNOSIS — M5412 Radiculopathy, cervical region: Secondary | ICD-10-CM

## 2013-11-15 DIAGNOSIS — M47817 Spondylosis without myelopathy or radiculopathy, lumbosacral region: Secondary | ICD-10-CM

## 2013-11-15 DIAGNOSIS — G4700 Insomnia, unspecified: Secondary | ICD-10-CM

## 2013-11-15 DIAGNOSIS — Z5181 Encounter for therapeutic drug level monitoring: Secondary | ICD-10-CM

## 2013-11-15 DIAGNOSIS — IMO0001 Reserved for inherently not codable concepts without codable children: Secondary | ICD-10-CM

## 2013-11-15 DIAGNOSIS — Z79899 Other long term (current) drug therapy: Secondary | ICD-10-CM

## 2013-11-15 DIAGNOSIS — M47816 Spondylosis without myelopathy or radiculopathy, lumbar region: Secondary | ICD-10-CM

## 2013-11-15 DIAGNOSIS — X58XXXS Exposure to other specified factors, sequela: Secondary | ICD-10-CM | POA: Insufficient documentation

## 2013-11-15 DIAGNOSIS — M179 Osteoarthritis of knee, unspecified: Secondary | ICD-10-CM

## 2013-11-15 DIAGNOSIS — M47812 Spondylosis without myelopathy or radiculopathy, cervical region: Secondary | ICD-10-CM

## 2013-11-15 DIAGNOSIS — F341 Dysthymic disorder: Secondary | ICD-10-CM

## 2013-11-15 MED ORDER — HYDROCODONE-ACETAMINOPHEN 7.5-325 MG PO TABS: ORAL_TABLET | ORAL | Status: DC

## 2013-11-15 MED ORDER — FENTANYL 25 MCG/HR TD PT72: 25.0000 ug | MEDICATED_PATCH | TRANSDERMAL | Status: DC

## 2013-11-15 NOTE — Progress Notes (Signed)
Subjective:    Patient ID: Kathryn Farrell, female    DOB: Dec 16, 1960, 53 y.o.   MRN: [id number]  HPI  Kathryn Farrell is back regarding her chronic pain. She had arthroscopic knee surgery on 12/30 at [facility]. She fell about 10 days ago when her dog tripped her and she went down on all 4's. She has had some swelling in her right knee for which she received some prednisone. She's also had a right hip injection. The prednisone may have helped a little. She tells me that she has a meniscus tear on the left knee as well.   Her left shoulder continues to bother her as well. It bothers her when she tries to comb her hair, lift up her new born grand baby, etc.      Pain Inventory Average Pain 9 Pain Right Now 10 My pain is sharp, burning, stabbing, tingling and aching  In the last 24 hours, has pain interfered with the following? General activity 10 Relation with others 9 Enjoyment of life 10 What TIME of day is your pain at its worst? constant Sleep (in general) Poor  Pain is worse with: walking, bending, sitting, standing and some activites Pain improves with: heat/ice, medication, TENS and injections Relief from Meds: 6  Mobility use a cane how many minutes can you walk? none because of pain ability to climb steps?  no do you drive?  yes Do you have any goals in this area?  yes  Function disabled: date disabled 10/28/12 I need assistance with the following:  meal prep, household duties and shopping  Neuro/Psych weakness numbness trouble walking spasms dizziness confusion depression anxiety  Prior Studies bone scan x-rays CT/MRI nerve study  Physicians involved in your care Dr Austin Miles   Family History  Problem Relation Age of Onset  . COPD Mother   . Asthma Mother   . Cancer Father     esophageal cander with mets  . Diabetes Brother   . Stroke Brother    History   Social History  . Marital Status: Married    Spouse Name: N/A    Number of Children: N/A  .  Years of Education: N/A   Social History Main Topics  . Smoking status: Former Smoker    Quit date: 10/28/1996  . Smokeless tobacco: Never Used  . Alcohol Use: No  . Drug Use: No  . Sexual Activity: None   Other Topics Concern  . None   Social History Narrative  . None   Past Surgical History  Procedure Laterality Date  . Abdominal hysterectomy  1989  . Arm surgery      "muscle came off the elbow"  . Cesarean section    . Spine surgery  2008    cervical fusion  . Knee arthroscopy  2004/2008    x2  . Upper gi endoscopy  04-27-13    Dr [name]  . Colonoscopy  2008  . Cholecystectomy     Past Medical History  Diagnosis Date  . GERD (gastroesophageal reflux disease)   . Neuromuscular disorder 2009    centralized pain syndrome/fibromylagia  . Osteoporosis   . C. difficile colitis 2008  . Colon polyps 2008  . Hiatal hernia 2014  . Chronic cholecystitis 05/07/2013   BP 114/66  Pulse 77  Resp 14  Ht 5' (1.524 m)  Wt 138 lb (62.596 kg)  BMI 26.95 kg/m2  SpO2 97%     Review of Systems  Respiratory: Positive for shortness of  breath.   Gastrointestinal: Positive for nausea and abdominal pain.  Musculoskeletal: Positive for arthralgias, back pain, gait problem, myalgias and neck pain.  Neurological: Positive for weakness and numbness.  Psychiatric/Behavioral: Positive for confusion and dysphoric mood. The patient is nervous/anxious.   All other systems reviewed and are negative.       Objective:   Physical Exam  Constitutional: She is oriented to person, place, and time. She appears well-developed. She may have gained a little weight.  Eyes: Conjunctivae and EOM are normal. Pupils are equal, round, and reactive to light.  Neck: Normal range of motion.  Cardiovascular: Normal rate and regular rhythm.  Pulmonary/Chest: Effort normal.  Abdominal: Soft.  Neurological: She is alert and oriented to person, place, and time. dtr's are 1-2 +. Strength in all 4 limbs  intact at 5/5. Spurling's maneuver remains equivocal to negative on the right and negative on the left. Extension pain noted and also DTR's were all 2+. Thoracic spine was notable for spasm along the bilateral paraspinal-rhomboid area. She has tenderness with palpation of the spinous processes at that level. Low back was tender to touch as well with spasm still appreciated in predominanty the right lumbar paraspinals. Posture overall was fair but more head-forward today  Patient's weight is stable. She ambulates with antalgia over the right knee. She does have some crepitus at the right knee with lateral joint line pain noted. McMurray's test positive right lateral knee. Small joint effusion is noted laterally and inferiorly. . No knee instability is appreciated.  Psych: she appears more anxious and depressed today.    Assessment & Plan:   ASSESSMENT:  1. Centralized pain syndrome/ FMS  2. Lumbar spondylosis. With facet and disc disease. Left L5 radiculopathy (improved)  3. Bilateral greater trochanter bursitis.  4. Osteoarthritis, right knee and left knee. S/p scope right knee 10/26/13  5. Right forearm extensor mechanism injury.  6. 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  7. Insomnia  8. Depression.  9. Left shoulder pain, likely RTC/labaral injury with bursitis.    PLAN:  1. Defer to ortho regarding mgt of knees, hip. She has had recent injection and oral steroids by report. She needs to work on generalized strengthening for her legs which should be guided by her surgeon for now.  2. Maintain celexa for mood.  3. Hydrocodone for breakthrough pain. rf rx written  4. Continue fentanyl patch to 25mcg for base line pain. rf rx written  5. May continue with supplements as recommended.  6. Trazodone for sleep (along with melatonin), 50mg  qhs.  7. Would likely benefit from aquatic activities for resistance exercise-- I filled out a handicapped parking pass form  for [employer] today. (temporary) 9.  zanaflex for muscle spasm in the thoracic and lumbar spine.   10. Follow up with me in about 2 months. RN visit in at that time. 30 minutes of face to face patient care time were spent during this visit. All questions were encouraged and answered.

## 2013-11-15 NOTE — Patient Instructions (Signed)
PLEASE CALL ME WITH ANY PROBLEMS OR QUESTIONS [phone]).     WORK ON PELVIS, THIGH, AND KNEE STRENGTHENING AND RANGE OF MOTION EXERCISES.

## 2013-11-19 ENCOUNTER — Telehealth: Payer: Self-pay

## 2013-11-19 MED ORDER — CYCLOBENZAPRINE HCL 10 MG PO TABS: 10.0000 mg | ORAL_TABLET | Freq: Three times a day (TID) | ORAL | Status: DC | PRN

## 2013-11-19 NOTE — Telephone Encounter (Signed)
Done

## 2013-11-19 NOTE — Telephone Encounter (Signed)
Left a voicemail informing patient that Flexeril RX was sent to pharmacy.

## 2013-11-19 NOTE — Telephone Encounter (Signed)
Patient requesting Flexeril since she could not take the "other medication" that was prescribed. She states she has used Flexeril in the past.

## 2013-11-20 DIAGNOSIS — M9420 Chondromalacia, unspecified site: Secondary | ICD-10-CM | POA: Insufficient documentation

## 2013-12-29 ENCOUNTER — Other Ambulatory Visit: Payer: Self-pay

## 2013-12-29 MED ORDER — TRAZODONE HCL 50 MG PO TABS: 50.0000 mg | ORAL_TABLET | Freq: Every day | ORAL | Status: DC

## 2014-01-12 ENCOUNTER — Encounter: Payer: Self-pay | Admitting: Physical Medicine & Rehabilitation

## 2014-01-12 ENCOUNTER — Encounter: Payer: 59 | Attending: Physical Medicine & Rehabilitation | Admitting: Physical Medicine & Rehabilitation

## 2014-01-12 VITALS — BP 116/64 | HR 83 | Resp 14 | Ht 60.0 in | Wt 136.0 lb

## 2014-01-12 DIAGNOSIS — F418 Other specified anxiety disorders: Secondary | ICD-10-CM

## 2014-01-12 DIAGNOSIS — G4700 Insomnia, unspecified: Secondary | ICD-10-CM | POA: Insufficient documentation

## 2014-01-12 DIAGNOSIS — X58XXXS Exposure to other specified factors, sequela: Secondary | ICD-10-CM | POA: Insufficient documentation

## 2014-01-12 DIAGNOSIS — M47816 Spondylosis without myelopathy or radiculopathy, lumbar region: Secondary | ICD-10-CM

## 2014-01-12 DIAGNOSIS — G890 Central pain syndrome: Secondary | ICD-10-CM | POA: Insufficient documentation

## 2014-01-12 DIAGNOSIS — K811 Chronic cholecystitis: Secondary | ICD-10-CM

## 2014-01-12 DIAGNOSIS — Z5181 Encounter for therapeutic drug level monitoring: Secondary | ICD-10-CM | POA: Insufficient documentation

## 2014-01-12 DIAGNOSIS — M1710 Unilateral primary osteoarthritis, unspecified knee: Secondary | ICD-10-CM

## 2014-01-12 DIAGNOSIS — IMO0001 Reserved for inherently not codable concepts without codable children: Secondary | ICD-10-CM | POA: Insufficient documentation

## 2014-01-12 DIAGNOSIS — M47812 Spondylosis without myelopathy or radiculopathy, cervical region: Secondary | ICD-10-CM

## 2014-01-12 DIAGNOSIS — Z79899 Other long term (current) drug therapy: Secondary | ICD-10-CM | POA: Insufficient documentation

## 2014-01-12 DIAGNOSIS — M961 Postlaminectomy syndrome, not elsewhere classified: Secondary | ICD-10-CM | POA: Insufficient documentation

## 2014-01-12 DIAGNOSIS — IMO0002 Reserved for concepts with insufficient information to code with codable children: Secondary | ICD-10-CM | POA: Insufficient documentation

## 2014-01-12 DIAGNOSIS — M76899 Other specified enthesopathies of unspecified lower limb, excluding foot: Secondary | ICD-10-CM | POA: Insufficient documentation

## 2014-01-12 DIAGNOSIS — M47817 Spondylosis without myelopathy or radiculopathy, lumbosacral region: Secondary | ICD-10-CM

## 2014-01-12 DIAGNOSIS — M179 Osteoarthritis of knee, unspecified: Secondary | ICD-10-CM

## 2014-01-12 DIAGNOSIS — F341 Dysthymic disorder: Secondary | ICD-10-CM | POA: Insufficient documentation

## 2014-01-12 DIAGNOSIS — M5412 Radiculopathy, cervical region: Secondary | ICD-10-CM | POA: Insufficient documentation

## 2014-01-12 DIAGNOSIS — M7060 Trochanteric bursitis, unspecified hip: Secondary | ICD-10-CM

## 2014-01-12 MED ORDER — FENTANYL 25 MCG/HR TD PT72: 25.0000 ug | MEDICATED_PATCH | TRANSDERMAL | Status: DC

## 2014-01-12 MED ORDER — HYDROCODONE-ACETAMINOPHEN 7.5-325 MG PO TABS: ORAL_TABLET | ORAL | Status: DC

## 2014-01-12 MED ORDER — TRAZODONE HCL 100 MG PO TABS: 100.0000 mg | ORAL_TABLET | Freq: Every day | ORAL | Status: DC

## 2014-01-12 NOTE — Patient Instructions (Signed)
PLEASE CALL ME WITH ANY PROBLEMS OR QUESTIONS (#[phone]).      

## 2014-01-12 NOTE — Progress Notes (Signed)
Subjective:    Patient ID: Kathryn Farrell, female    DOB: Jun 17, 1961, 53 y.o.   MRN: [id number]  HPI  Kathryn Farrell is back regarding her chronic pain. She just had the MRI of her hip and left shoulder. She was told she has osteoarthritis based on xrays before.   She was diagnosed with Raynaud's disease.   In describing her problems today, Kathryn Farrell moved from one problem to another seamlessly. She complained of her low back, the left shoulder, her right hip, her kidney stones, her poor sleep, her anxiety and depression, etc. I had to stop her on occasions to let her gather herself. Despite receiving feedback that she needed to slow down, she would start back a few moments later with her complaints.   She tells me that her husband remains supportive and is the only one who really knows what she's going through.  She is changing her pcp to [name].     Pain Inventory Average Pain 9 Pain Right Now 9 My pain is sharp, burning, stabbing, tingling and aching  In the last 24 hours, has pain interfered with the following? General activity 10 Relation with others 9 Enjoyment of life 10 What TIME of day is your pain at its worst? constant Sleep (in general) Poor  Pain is worse with: walking, bending, sitting, standing and some activites Pain improves with: heat/ice, medication, TENS and injections Relief from Meds: 6  Mobility walk with assistance use a cane how many minutes can you walk? 0 ability to climb steps?  no do you drive?  yes  Function disabled: date disabled 10/2012 I need assistance with the following:  meal prep, household duties and shopping  Neuro/Psych weakness numbness tingling trouble walking spasms confusion depression anxiety  Prior Studies bone scan x-rays CT/MRI  Physicians involved in your care Dr Austin Miles, Dr Daisy Lazar   Family History  Problem Relation Age of Onset  . COPD Mother   . Asthma Mother   . Cancer Father     esophageal cander  with mets  . Diabetes Brother   . Stroke Brother    History   Social History  . Marital Status: Married    Spouse Name: N/A    Number of Children: N/A  . Years of Education: N/A   Social History Main Topics  . Smoking status: Former Smoker    Quit date: 10/28/1996  . Smokeless tobacco: Never Used  . Alcohol Use: No  . Drug Use: No  . Sexual Activity: None   Other Topics Concern  . None   Social History Narrative  . None   Past Surgical History  Procedure Laterality Date  . Abdominal hysterectomy  1989  . Arm surgery      "muscle came off the elbow"  . Cesarean section    . Spine surgery  2008    cervical fusion  . Knee arthroscopy  2004/2008    x2  . Upper gi endoscopy  04-27-13    Dr [name]  . Colonoscopy  2008  . Cholecystectomy     Past Medical History  Diagnosis Date  . GERD (gastroesophageal reflux disease)   . Neuromuscular disorder 2009    centralized pain syndrome/fibromylagia  . Osteoporosis   . C. difficile colitis 2008  . Colon polyps 2008  . Hiatal hernia 2014  . Chronic cholecystitis 05/07/2013   BP 116/64  Pulse 83  Resp 14  Ht 5' (1.524 m)  Wt 136 lb (61.689 kg)  BMI 26.56 kg/m2  SpO2 96%  Opioid Risk Score:   Fall Risk Score: Moderate Fall Risk (6-13 points) (patient educated handout declined)   Review of Systems  Constitutional: Positive for diaphoresis.  Respiratory: Positive for shortness of breath.   Gastrointestinal: Positive for nausea and abdominal pain.  Musculoskeletal: Positive for back pain, gait problem and neck pain.  Neurological: Positive for tremors, weakness and numbness.  Psychiatric/Behavioral: Positive for confusion and dysphoric mood. The patient is nervous/anxious.   All other systems reviewed and are negative.       Objective:   Physical Exam  Constitutional: She is oriented to person, place, and time. She appears well-developed. She may have gained a little weight.  Eyes: Conjunctivae and EOM are  normal. Pupils are equal, round, and reactive to light.  Neck: Normal range of motion.  Cardiovascular: Normal rate and regular rhythm.  Pulmonary/Chest: Effort normal.  Abdominal: Soft.  Neurological: She is alert and oriented to person, place, and time. dtr's are 1-2 +. Strength in all 4 limbs intact at 5/5. Spurling's maneuver remains equivocal to negative on the right and negative on the left. Extension pain noted and also DTR's were all 2+. Thoracic spine was notable for spasm along the bilateral paraspinal-rhomboid area. She has tenderness with palpation of the spinous processes at that level. Low back was tender to touch as well with spasm still appreciated in predominanty the right lumbar paraspinals. Posture overall was fair but more head-forward today  Patient's weight is stable. She ambulates with antalgia over the right knee. She is wearing a knee sleeve today. Marland Kitchen.  Psych: she appears  anxious and depressed today. Tearful at times   Assessment & Plan:   ASSESSMENT:  1. Centralized pain syndrome/ FMS  2. Lumbar spondylosis. With facet and disc disease. Left L5 radiculopathy (improved)  3. Bilateral greater trochanter bursitis.  4. Osteoarthritis, right knee and left knee. S/p scope right knee 10/26/13  5. Right forearm extensor mechanism injury.  6. 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  7. Insomnia  8. Depression.  9. Left shoulder pain, likely RTC/labaral injury with bursitis.    PLAN:  1. Ortho mgt of knees, hip. She has had recent injection and oral steroids by report. She needs to work on generalized strengthening for her legs which should be guided by her surgeon for now.  2. Maintain celexa for mood.  3. Hydrocodone for breakthrough pain. rf rx written  4. Continue fentanyl patch to 25mcg for base line pain. rf rx written  5. Will make a referral to Dr. Eula FlaxMichael Zelson to see if he can assist Kathryn Farrell in developing better coping skills for  her stress and pain. Her anxiety and depression have built to such levels that she is unable to deal with her numerous problems, and the psych symptoms themselves are actually EXACERBATING her pain as well. 6. Increase trazodone to 100mg   To help with sleep and mood. Decrease valerian root slightly 7. Gloves for Raynaud symptoms in hands, during the cooler weather at least   9. zanaflex for muscle spasm in the thoracic and lumbar spine.  10. Follow up with NP/RN in 2 months. 30 minutes of face to face patient care time were spent during this visit. All questions were encouraged and answered.

## 2014-01-18 ENCOUNTER — Emergency Department: Payer: Self-pay | Admitting: Emergency Medicine

## 2014-01-19 ENCOUNTER — Ambulatory Visit: Payer: 59 | Admitting: Physical Medicine & Rehabilitation

## 2014-01-21 ENCOUNTER — Encounter (HOSPITAL_BASED_OUTPATIENT_CLINIC_OR_DEPARTMENT_OTHER): Payer: 59 | Admitting: Physical Medicine & Rehabilitation

## 2014-01-21 ENCOUNTER — Encounter: Payer: Self-pay | Admitting: Physical Medicine & Rehabilitation

## 2014-01-21 VITALS — BP 107/66 | HR 78 | Resp 97 | Ht 60.0 in | Wt 138.0 lb

## 2014-01-21 DIAGNOSIS — M961 Postlaminectomy syndrome, not elsewhere classified: Secondary | ICD-10-CM

## 2014-01-21 DIAGNOSIS — IMO0002 Reserved for concepts with insufficient information to code with codable children: Secondary | ICD-10-CM

## 2014-01-21 DIAGNOSIS — M179 Osteoarthritis of knee, unspecified: Secondary | ICD-10-CM

## 2014-01-21 DIAGNOSIS — F341 Dysthymic disorder: Secondary | ICD-10-CM

## 2014-01-21 DIAGNOSIS — F418 Other specified anxiety disorders: Secondary | ICD-10-CM

## 2014-01-21 DIAGNOSIS — M47812 Spondylosis without myelopathy or radiculopathy, cervical region: Secondary | ICD-10-CM

## 2014-01-21 DIAGNOSIS — IMO0001 Reserved for inherently not codable concepts without codable children: Secondary | ICD-10-CM

## 2014-01-21 DIAGNOSIS — M5412 Radiculopathy, cervical region: Secondary | ICD-10-CM

## 2014-01-21 DIAGNOSIS — M1710 Unilateral primary osteoarthritis, unspecified knee: Secondary | ICD-10-CM

## 2014-01-21 DIAGNOSIS — M47817 Spondylosis without myelopathy or radiculopathy, lumbosacral region: Secondary | ICD-10-CM

## 2014-01-21 DIAGNOSIS — M47816 Spondylosis without myelopathy or radiculopathy, lumbar region: Secondary | ICD-10-CM

## 2014-01-21 MED ORDER — HYDROCODONE-ACETAMINOPHEN 10-325 MG PO TABS: 1.0000 | ORAL_TABLET | Freq: Four times a day (QID) | ORAL | Status: DC | PRN

## 2014-01-21 MED ORDER — PREDNISONE 20 MG PO TABS: 20.0000 mg | ORAL_TABLET | ORAL | Status: DC
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
## 2014-01-21 NOTE — Patient Instructions (Signed)
PLEASE WORK ON REGULAR HEAT, ICE, MASSAGE, STRETCHING TO YOUR NECK.   PLEASE CALL ME WITH ANY PROBLEMS OR QUESTIONS [phone]).

## 2014-01-24 ENCOUNTER — Other Ambulatory Visit: Payer: Self-pay

## 2014-01-24 NOTE — Progress Notes (Signed)
Urine Drug screen from 01/12/2014 was consistent.

## 2014-01-30 DIAGNOSIS — M25859 Other specified joint disorders, unspecified hip: Secondary | ICD-10-CM | POA: Insufficient documentation

## 2014-01-30 DIAGNOSIS — M7550 Bursitis of unspecified shoulder: Secondary | ICD-10-CM | POA: Insufficient documentation

## 2014-01-30 DIAGNOSIS — M7520 Bicipital tendinitis, unspecified shoulder: Secondary | ICD-10-CM | POA: Insufficient documentation

## 2014-01-30 DIAGNOSIS — M719 Bursopathy, unspecified: Secondary | ICD-10-CM

## 2014-01-30 DIAGNOSIS — M67919 Unspecified disorder of synovium and tendon, unspecified shoulder: Secondary | ICD-10-CM | POA: Insufficient documentation

## 2014-02-02 ENCOUNTER — Telehealth: Payer: Self-pay

## 2014-02-02 DIAGNOSIS — F4542 Pain disorder with related psychological factors: Secondary | ICD-10-CM

## 2014-02-02 DIAGNOSIS — F329 Major depressive disorder, single episode, unspecified: Secondary | ICD-10-CM

## 2014-02-02 DIAGNOSIS — F3289 Other specified depressive episodes: Secondary | ICD-10-CM

## 2014-02-02 NOTE — Telephone Encounter (Signed)
Patient called and stated she is still having bad headaches and pain down her back. She said that you mentioned going for a CT myelogram. Please advise.

## 2014-02-02 NOTE — Telephone Encounter (Signed)
Still to early to order a myelogram given time frame of injury. Needs to be aggressive with muscle relaxants, heat, stretching.  Her neuro exam was stable at last visit with me. Would give it another 7-10 days to improve.

## 2014-02-03 NOTE — Telephone Encounter (Signed)
Attempted to contact patient. Left a voicemail to return call to clinic.  

## 2014-02-03 NOTE — Telephone Encounter (Signed)
Patient returned call to clinic. Informed her that per Dr. Riley KillSwartz it was still to early to order a myelogram. She needed to be aggressive with the heat, stretching, and muscle relaxants, and to give it another 7-10 days to improve.

## 2014-02-15 ENCOUNTER — Emergency Department: Payer: Self-pay | Admitting: Emergency Medicine

## 2014-03-01 DIAGNOSIS — M797 Fibromyalgia: Secondary | ICD-10-CM | POA: Insufficient documentation

## 2014-03-01 DIAGNOSIS — E785 Hyperlipidemia, unspecified: Secondary | ICD-10-CM | POA: Insufficient documentation

## 2014-03-01 DIAGNOSIS — M5090 Cervical disc disorder, unspecified, unspecified cervical region: Secondary | ICD-10-CM | POA: Insufficient documentation

## 2014-03-04 DIAGNOSIS — F4542 Pain disorder with related psychological factors: Secondary | ICD-10-CM

## 2014-03-04 DIAGNOSIS — F3289 Other specified depressive episodes: Secondary | ICD-10-CM

## 2014-03-04 DIAGNOSIS — F329 Major depressive disorder, single episode, unspecified: Secondary | ICD-10-CM

## 2014-03-09 ENCOUNTER — Telehealth: Payer: Self-pay

## 2014-03-09 ENCOUNTER — Encounter: Payer: Self-pay | Admitting: Physical Medicine & Rehabilitation

## 2014-03-09 ENCOUNTER — Encounter: Payer: 59 | Attending: Physical Medicine & Rehabilitation | Admitting: Physical Medicine & Rehabilitation

## 2014-03-09 VITALS — BP 115/64 | HR 88 | Resp 20 | Ht 60.0 in | Wt 131.0 lb

## 2014-03-09 DIAGNOSIS — M76899 Other specified enthesopathies of unspecified lower limb, excluding foot: Secondary | ICD-10-CM

## 2014-03-09 DIAGNOSIS — Z79899 Other long term (current) drug therapy: Secondary | ICD-10-CM | POA: Diagnosis present

## 2014-03-09 DIAGNOSIS — IMO0002 Reserved for concepts with insufficient information to code with codable children: Secondary | ICD-10-CM

## 2014-03-09 DIAGNOSIS — Z5181 Encounter for therapeutic drug level monitoring: Secondary | ICD-10-CM

## 2014-03-09 DIAGNOSIS — M47817 Spondylosis without myelopathy or radiculopathy, lumbosacral region: Secondary | ICD-10-CM

## 2014-03-09 DIAGNOSIS — G890 Central pain syndrome: Secondary | ICD-10-CM

## 2014-03-09 DIAGNOSIS — M961 Postlaminectomy syndrome, not elsewhere classified: Secondary | ICD-10-CM

## 2014-03-09 DIAGNOSIS — X58XXXS Exposure to other specified factors, sequela: Secondary | ICD-10-CM | POA: Diagnosis not present

## 2014-03-09 DIAGNOSIS — F341 Dysthymic disorder: Secondary | ICD-10-CM | POA: Diagnosis present

## 2014-03-09 DIAGNOSIS — M179 Osteoarthritis of knee, unspecified: Secondary | ICD-10-CM

## 2014-03-09 DIAGNOSIS — M47816 Spondylosis without myelopathy or radiculopathy, lumbar region: Secondary | ICD-10-CM

## 2014-03-09 DIAGNOSIS — M1710 Unilateral primary osteoarthritis, unspecified knee: Secondary | ICD-10-CM

## 2014-03-09 DIAGNOSIS — M5412 Radiculopathy, cervical region: Secondary | ICD-10-CM

## 2014-03-09 DIAGNOSIS — G4700 Insomnia, unspecified: Secondary | ICD-10-CM

## 2014-03-09 DIAGNOSIS — IMO0001 Reserved for inherently not codable concepts without codable children: Secondary | ICD-10-CM | POA: Diagnosis present

## 2014-03-09 DIAGNOSIS — M47812 Spondylosis without myelopathy or radiculopathy, cervical region: Secondary | ICD-10-CM | POA: Diagnosis present

## 2014-03-09 DIAGNOSIS — F418 Other specified anxiety disorders: Secondary | ICD-10-CM

## 2014-03-09 DIAGNOSIS — M7060 Trochanteric bursitis, unspecified hip: Secondary | ICD-10-CM

## 2014-03-09 MED ORDER — FENTANYL 25 MCG/HR TD PT72: 25.0000 ug | MEDICATED_PATCH | TRANSDERMAL | Status: DC

## 2014-03-09 MED ORDER — HYDROCODONE-ACETAMINOPHEN 10-325 MG PO TABS: 1.0000 | ORAL_TABLET | Freq: Four times a day (QID) | ORAL | Status: DC | PRN

## 2014-03-09 NOTE — Telephone Encounter (Signed)
I returned Kathryn Farrell' call. No answer---left VM.

## 2014-03-09 NOTE — Telephone Encounter (Signed)
Kathryn RotaBelinda Farrell patients representative called regarding patient and her depression and other issues.  She would like a call back.

## 2014-03-09 NOTE — Patient Instructions (Signed)
PLEASE CALL ME WITH ANY PROBLEMS OR QUESTIONS [phone]).     PLEASE WORK ON YOUR POSTURE WHEN YOU SIT/STAND.   WORK ON RELAXATION TECHNIQUES AND LEISURE ACTIVITIES
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
## 2014-03-09 NOTE — Telephone Encounter (Signed)
Pharmacy called to see if they could fill patient hydrocodone and patches early.  Patient had a previous rx on hold at the pharmacy.  Patient still had 31 pills and 1 patch left.  Advised pharmacy not to fill either rx early.

## 2014-03-28 ENCOUNTER — Telehealth: Payer: Self-pay

## 2014-03-28 DIAGNOSIS — M47816 Spondylosis without myelopathy or radiculopathy, lumbar region: Secondary | ICD-10-CM

## 2014-03-28 DIAGNOSIS — M76899 Other specified enthesopathies of unspecified lower limb, excluding foot: Secondary | ICD-10-CM

## 2014-03-28 DIAGNOSIS — M47812 Spondylosis without myelopathy or radiculopathy, cervical region: Secondary | ICD-10-CM

## 2014-03-28 DIAGNOSIS — M961 Postlaminectomy syndrome, not elsewhere classified: Secondary | ICD-10-CM

## 2014-03-28 DIAGNOSIS — IMO0001 Reserved for inherently not codable concepts without codable children: Secondary | ICD-10-CM

## 2014-03-28 DIAGNOSIS — F418 Other specified anxiety disorders: Secondary | ICD-10-CM

## 2014-03-28 DIAGNOSIS — M1710 Unilateral primary osteoarthritis, unspecified knee: Secondary | ICD-10-CM

## 2014-03-28 DIAGNOSIS — Z5181 Encounter for therapeutic drug level monitoring: Secondary | ICD-10-CM

## 2014-03-28 DIAGNOSIS — M47817 Spondylosis without myelopathy or radiculopathy, lumbosacral region: Secondary | ICD-10-CM

## 2014-03-28 DIAGNOSIS — IMO0002 Reserved for concepts with insufficient information to code with codable children: Secondary | ICD-10-CM

## 2014-03-28 DIAGNOSIS — Z79899 Other long term (current) drug therapy: Secondary | ICD-10-CM

## 2014-03-28 DIAGNOSIS — G890 Central pain syndrome: Secondary | ICD-10-CM

## 2014-03-28 DIAGNOSIS — F341 Dysthymic disorder: Secondary | ICD-10-CM

## 2014-03-28 DIAGNOSIS — M5412 Radiculopathy, cervical region: Secondary | ICD-10-CM

## 2014-03-28 DIAGNOSIS — M179 Osteoarthritis of knee, unspecified: Secondary | ICD-10-CM

## 2014-03-28 MED ORDER — HYDROCODONE-ACETAMINOPHEN 10-325 MG PO TABS: 1.0000 | ORAL_TABLET | Freq: Four times a day (QID) | ORAL | Status: DC | PRN

## 2014-03-28 MED ORDER — FENTANYL 25 MCG/HR TD PT72: 25.0000 ug | MEDICATED_PATCH | TRANSDERMAL | Status: DC

## 2014-03-28 NOTE — Telephone Encounter (Signed)
She may have early. Make sure we rx #120 next time. I think, because i increased the dosage, that it defaulted to #60 and i didn't see it.

## 2014-03-28 NOTE — Telephone Encounter (Signed)
Notified Ms Fehrmann she may pick up rx and hydrocodone can be filled now but fentanyl will have to wait until 6/10-12/15

## 2014-03-28 NOTE — Telephone Encounter (Signed)
Printed for MD to sign.

## 2014-03-28 NOTE — Telephone Encounter (Signed)
Refill not due until 04/08/14. Per [facility] filled 03/10/14 , but hydrocodone was 10/325 #60 and had been getting #120 7.5/325. Please advise.  Appt not until 04/15/14

## 2014-03-28 NOTE — Telephone Encounter (Signed)
Patient is requesting a refill on Fentanyl and Hydrocodone. Last fill date 5/13 and next appt is 6/19.

## 2014-03-29 ENCOUNTER — Other Ambulatory Visit: Payer: Self-pay | Admitting: Physical Medicine & Rehabilitation

## 2014-03-29 DIAGNOSIS — M47812 Spondylosis without myelopathy or radiculopathy, cervical region: Secondary | ICD-10-CM

## 2014-03-29 DIAGNOSIS — M5412 Radiculopathy, cervical region: Secondary | ICD-10-CM

## 2014-03-29 DIAGNOSIS — M961 Postlaminectomy syndrome, not elsewhere classified: Secondary | ICD-10-CM

## 2014-03-29 DIAGNOSIS — G890 Central pain syndrome: Secondary | ICD-10-CM

## 2014-04-04 ENCOUNTER — Ambulatory Visit
Admission: RE | Admit: 2014-04-04 | Discharge: 2014-04-04 | Disposition: A | Discharge status: Home / Self Care | Payer: 59 | Source: Ambulatory Visit | Attending: Physical Medicine & Rehabilitation | Admitting: Physical Medicine & Rehabilitation

## 2014-04-04 VITALS — BP 99/57 | HR 66

## 2014-04-04 DIAGNOSIS — M961 Postlaminectomy syndrome, not elsewhere classified: Secondary | ICD-10-CM

## 2014-04-04 DIAGNOSIS — M5412 Radiculopathy, cervical region: Secondary | ICD-10-CM

## 2014-04-04 DIAGNOSIS — M47812 Spondylosis without myelopathy or radiculopathy, cervical region: Secondary | ICD-10-CM

## 2014-04-04 DIAGNOSIS — G890 Central pain syndrome: Secondary | ICD-10-CM

## 2014-04-04 DIAGNOSIS — M47816 Spondylosis without myelopathy or radiculopathy, lumbar region: Secondary | ICD-10-CM

## 2014-04-04 MED ORDER — DIAZEPAM 5 MG PO TABS
10.0000 mg | ORAL_TABLET | Freq: Once | ORAL | Status: AC
  Administered 2014-04-04: 10 mg via ORAL

## 2014-04-04 MED ORDER — IOHEXOL 300 MG/ML  SOLN
10.0000 mL | Freq: Once | INTRAMUSCULAR | Status: AC | PRN
  Administered 2014-04-04: 10 mL via INTRATHECAL

## 2014-04-04 NOTE — Progress Notes (Signed)
Pt states she stopped celexa and trazodone last week when appointment made.  Discharge instructions explained to pt.

## 2014-04-04 NOTE — Discharge Instructions (Signed)
Myelogram Discharge Instructions  1. Go home and rest quietly for the next 24 hours.  It is important to lie flat for the next 24 hours.  Get up only to go to the restroom.  You may lie in the bed or on a couch on your back, your stomach, your left side or your right side.  You may have one pillow under your head.  You may have pillows between your knees while you are on your side or under your knees while you are on your back.  2. DO NOT drive today.  Recline the seat as far back as it will go, while still wearing your seat belt, on the way home.  3. You may get up to go to the bathroom as needed.  You may sit up for 10 minutes to eat.  You may resume your normal diet and medications unless otherwise indicated.  Drink lots of extra fluids today and tomorrow.  4. The incidence of headache, nausea, or vomiting is about 5% (one in 20 patients).  If you develop a headache, lie flat and drink plenty of fluids until the headache goes away.  Caffeinated beverages may be helpful.  If you develop severe nausea and vomiting or a headache that does not go away with flat bed rest, call [phone].  5. You may resume normal activities after your 24 hours of bed rest is over; however, do not exert yourself strongly or do any heavy lifting tomorrow. If when you get up you have a headache when standing, go back to bed and force fluids for another 24 hours.  6. Call your physician for a follow-up appointment.  The results of your myelogram will be sent directly to your physician by the following day.  7. If you have any questions or if complications develop after you arrive home, please call [phone].  Discharge instructions have been explained to the patient.  The patient, or the person responsible for the patient, fully understands these instructions.   May resume Citalopram and trazodone on April 05, 2014, after 1:00 pm.

## 2014-04-07 ENCOUNTER — Other Ambulatory Visit: Payer: Self-pay | Admitting: Physical Medicine & Rehabilitation

## 2014-04-07 ENCOUNTER — Ambulatory Visit
Admission: RE | Admit: 2014-04-07 | Discharge: 2014-04-07 | Disposition: A | Discharge status: Home / Self Care | Payer: 59 | Source: Ambulatory Visit | Attending: Physical Medicine & Rehabilitation | Admitting: Physical Medicine & Rehabilitation

## 2014-04-07 ENCOUNTER — Telehealth: Payer: Self-pay

## 2014-04-07 DIAGNOSIS — G971 Other reaction to spinal and lumbar puncture: Secondary | ICD-10-CM

## 2014-04-07 MED ORDER — IOHEXOL 180 MG/ML  SOLN
1.0000 mL | Freq: Once | INTRAMUSCULAR | Status: AC | PRN
  Administered 2014-04-07: 1 mL via EPIDURAL

## 2014-04-07 NOTE — Discharge Instructions (Addendum)
Epidural Blood Patch Discharge Instructions  1. Go home and rest quietly for the next 24 hours.  It is important to lie flat for the next 24 hours.  Get up only to go to the restroom.  You may lie in the bed or on a couch on your back, your stomach, your left side or your right side.  You may have one pillow under your head.  You may have pillows between your knees while you are on your side or under your knees while you are on your back.  2. DO NOT drive today.  Recline the seat as far back as it will go, while still wearing your seat belt, on the way home.  3. You may get up to go to the bathroom as needed.  You may sit up for 10 minutes to eat.  You may resume your normal diet and medications unless otherwise indicated.  Drink plenty of extra fluids today and tomorrow.  4. You may resume normal activities after your 24 hours of bed rest is over; however, do not exert yourself strongly or do any heavy lifting tomorrow.  5. Call your physician for a follow-up appointment.

## 2014-04-07 NOTE — Telephone Encounter (Signed)
Patient had a CT Myelogram on 04/04/2014.  She has called [facility] today complaining of a headache.  She has had this problem in the past.  Per verbal from Dr Wynn Banker, a blood patch is appropriate.

## 2014-04-07 NOTE — Progress Notes (Signed)
20cc blood drawn from left [facility] space without difficulty for procedure; site unremarkable.  jkl

## 2014-04-07 NOTE — Progress Notes (Signed)
Blood drawn from left Ac for blood patch. Site unremarkable, 20 cc's of blood obtained, pt tolerated well.

## 2014-04-08 NOTE — Progress Notes (Signed)
Forgot to chart demeral 75mg  and zofran 4 mg on the day of her myelogram which was 04/04/14 at about 1315 pm. ddougherty rn

## 2014-04-11 ENCOUNTER — Telehealth: Payer: Self-pay | Admitting: Physical Medicine & Rehabilitation

## 2014-04-11 NOTE — Telephone Encounter (Signed)
i spoke with Kathryn Farrell at length regarding her CT myelogram. Recommended a C7-T1 translaminar ESI per Dr. Wynn BankerKirsteins. I am not sure how far out he is on injections however. May need to consider scheduling it elsewhere if he can't get to it in the near future.

## 2014-04-11 NOTE — Telephone Encounter (Signed)
Contacted patient to schedule appt for C7-T1 Translaminar ESI with Dr. Wynn BankerKirsteins. Appt is 7/23. Patient is also scheduled for a follow up appt with Dr. Riley KillSwartz on 8/19.

## 2014-04-12 ENCOUNTER — Telehealth: Payer: Self-pay

## 2014-04-12 NOTE — Telephone Encounter (Signed)
Letter request form [employer] written and mailed to patient.

## 2014-04-13 ENCOUNTER — Telehealth: Payer: Self-pay

## 2014-04-13 ENCOUNTER — Encounter: Payer: Self-pay | Admitting: Physical Medicine & Rehabilitation

## 2014-04-13 NOTE — Telephone Encounter (Signed)
Just give it to her for free. i don't care

## 2014-04-13 NOTE — Telephone Encounter (Signed)
Letter mailed to patient.

## 2014-04-13 NOTE — Telephone Encounter (Signed)
Patient called to let us know that the person doing her disability is not the one paying for the letter, the patient has to pay the $300 out of her own pocket.  She wanted to know if there was something she could work out with us like make payments or cut the cost.  Please advise.

## 2014-04-13 NOTE — Telephone Encounter (Signed)
Spoke with Kathryn RotaBelinda Farrell patients representative.  Advised her the letter she requested from Dr Riley KillSwartz was ready and there would be a charge of $300.  Kathryn BougieBelinda said she would discuss this with the patient.  Advised her we could send the letter once the payment was made.

## 2014-04-15 ENCOUNTER — Ambulatory Visit: Payer: 59 | Admitting: Physical Medicine & Rehabilitation

## 2014-04-25 ENCOUNTER — Telehealth: Payer: Self-pay | Admitting: *Deleted

## 2014-04-25 DIAGNOSIS — F341 Dysthymic disorder: Secondary | ICD-10-CM

## 2014-04-25 DIAGNOSIS — F418 Other specified anxiety disorders: Secondary | ICD-10-CM

## 2014-04-25 DIAGNOSIS — IMO0001 Reserved for inherently not codable concepts without codable children: Secondary | ICD-10-CM

## 2014-04-25 DIAGNOSIS — M47812 Spondylosis without myelopathy or radiculopathy, cervical region: Secondary | ICD-10-CM

## 2014-04-25 DIAGNOSIS — Z79899 Other long term (current) drug therapy: Secondary | ICD-10-CM

## 2014-04-25 DIAGNOSIS — M5412 Radiculopathy, cervical region: Secondary | ICD-10-CM

## 2014-04-25 DIAGNOSIS — IMO0002 Reserved for concepts with insufficient information to code with codable children: Secondary | ICD-10-CM

## 2014-04-25 DIAGNOSIS — G890 Central pain syndrome: Secondary | ICD-10-CM

## 2014-04-25 DIAGNOSIS — M961 Postlaminectomy syndrome, not elsewhere classified: Secondary | ICD-10-CM

## 2014-04-25 DIAGNOSIS — Z5181 Encounter for therapeutic drug level monitoring: Secondary | ICD-10-CM

## 2014-04-25 DIAGNOSIS — M1710 Unilateral primary osteoarthritis, unspecified knee: Secondary | ICD-10-CM

## 2014-04-25 DIAGNOSIS — M47817 Spondylosis without myelopathy or radiculopathy, lumbosacral region: Secondary | ICD-10-CM

## 2014-04-25 MED ORDER — FENTANYL 25 MCG/HR TD PT72: 25.0000 ug | MEDICATED_PATCH | TRANSDERMAL | Status: DC

## 2014-04-25 MED ORDER — HYDROCODONE-ACETAMINOPHEN 10-325 MG PO TABS: 1.0000 | ORAL_TABLET | Freq: Four times a day (QID) | ORAL | Status: DC | PRN

## 2014-04-25 NOTE — Telephone Encounter (Signed)
Refill Norco. Last given rx on 03/28/14. Has appt with Kirsteins 05/19/14 for injection and Riley KillSwartz 8/191/15.  Refill rx printed for both Norco and Fentanyl for Eunice to sign.

## 2014-05-12 ENCOUNTER — Telehealth: Payer: Self-pay | Admitting: Physical Medicine & Rehabilitation

## 2014-05-12 NOTE — Telephone Encounter (Signed)
JUST FYI: pt want Dr. Riley KillSwartz to know why she cancel her injections with Dr. Kirtland BouchardK... She states she is going to see a Careers advisersurgeon [facility] and [facility].... She states she wants to see what they say first before she has the injections.Marland Kitchen..Marland Kitchen

## 2014-05-19 ENCOUNTER — Ambulatory Visit: Payer: 59 | Admitting: Physical Medicine & Rehabilitation

## 2014-05-24 ENCOUNTER — Telehealth: Payer: Self-pay

## 2014-05-24 DIAGNOSIS — M47812 Spondylosis without myelopathy or radiculopathy, cervical region: Secondary | ICD-10-CM

## 2014-05-24 DIAGNOSIS — M961 Postlaminectomy syndrome, not elsewhere classified: Secondary | ICD-10-CM

## 2014-05-24 DIAGNOSIS — IMO0001 Reserved for inherently not codable concepts without codable children: Secondary | ICD-10-CM

## 2014-05-24 DIAGNOSIS — F418 Other specified anxiety disorders: Secondary | ICD-10-CM

## 2014-05-24 DIAGNOSIS — M5412 Radiculopathy, cervical region: Secondary | ICD-10-CM

## 2014-05-24 MED ORDER — HYDROCODONE-ACETAMINOPHEN 10-325 MG PO TABS: 1.0000 | ORAL_TABLET | Freq: Four times a day (QID) | ORAL | Status: DC | PRN

## 2014-05-24 NOTE — Telephone Encounter (Signed)
Patient called requesting hydrocodone refill.  Rx printed will call patient when signed and ready for pick up.

## 2014-05-25 NOTE — Telephone Encounter (Signed)
Contacted patient to inform her the Hydrocodone RX is ready for pickup.

## 2014-06-09 ENCOUNTER — Ambulatory Visit: Payer: Self-pay | Admitting: Unknown Physician Specialty

## 2014-06-13 ENCOUNTER — Other Ambulatory Visit: Payer: Self-pay

## 2014-06-13 LAB — PATHOLOGY REPORT

## 2014-06-13 MED ORDER — CITALOPRAM HYDROBROMIDE 20 MG PO TABS: 20.0000 mg | ORAL_TABLET | Freq: Every day | ORAL | Status: DC

## 2014-06-13 NOTE — Telephone Encounter (Signed)
Citalopram refilled  With 5 add'l refills per Dr. Riley KillSwartz.

## 2014-06-15 ENCOUNTER — Encounter: Payer: 59 | Attending: Physical Medicine & Rehabilitation | Admitting: Physical Medicine & Rehabilitation

## 2014-06-15 ENCOUNTER — Other Ambulatory Visit: Payer: Self-pay

## 2014-06-15 ENCOUNTER — Encounter: Payer: Self-pay | Admitting: Physical Medicine & Rehabilitation

## 2014-06-15 VITALS — BP 120/72 | HR 87 | Resp 14 | Ht 60.0 in | Wt 125.0 lb

## 2014-06-15 DIAGNOSIS — M47812 Spondylosis without myelopathy or radiculopathy, cervical region: Secondary | ICD-10-CM | POA: Diagnosis present

## 2014-06-15 DIAGNOSIS — Z79899 Other long term (current) drug therapy: Secondary | ICD-10-CM | POA: Insufficient documentation

## 2014-06-15 DIAGNOSIS — M961 Postlaminectomy syndrome, not elsewhere classified: Secondary | ICD-10-CM | POA: Insufficient documentation

## 2014-06-15 DIAGNOSIS — M47896 Other spondylosis, lumbar region: Secondary | ICD-10-CM

## 2014-06-15 DIAGNOSIS — G4700 Insomnia, unspecified: Secondary | ICD-10-CM | POA: Diagnosis present

## 2014-06-15 DIAGNOSIS — IMO0002 Reserved for concepts with insufficient information to code with codable children: Secondary | ICD-10-CM | POA: Diagnosis not present

## 2014-06-15 DIAGNOSIS — M1710 Unilateral primary osteoarthritis, unspecified knee: Secondary | ICD-10-CM | POA: Diagnosis not present

## 2014-06-15 DIAGNOSIS — F418 Other specified anxiety disorders: Secondary | ICD-10-CM

## 2014-06-15 DIAGNOSIS — X58XXXS Exposure to other specified factors, sequela: Secondary | ICD-10-CM | POA: Insufficient documentation

## 2014-06-15 DIAGNOSIS — IMO0001 Reserved for inherently not codable concepts without codable children: Secondary | ICD-10-CM

## 2014-06-15 DIAGNOSIS — G890 Central pain syndrome: Secondary | ICD-10-CM | POA: Insufficient documentation

## 2014-06-15 DIAGNOSIS — Z5181 Encounter for therapeutic drug level monitoring: Secondary | ICD-10-CM | POA: Diagnosis present

## 2014-06-15 DIAGNOSIS — M5412 Radiculopathy, cervical region: Secondary | ICD-10-CM | POA: Insufficient documentation

## 2014-06-15 DIAGNOSIS — M76899 Other specified enthesopathies of unspecified lower limb, excluding foot: Secondary | ICD-10-CM | POA: Insufficient documentation

## 2014-06-15 DIAGNOSIS — M47817 Spondylosis without myelopathy or radiculopathy, lumbosacral region: Secondary | ICD-10-CM | POA: Diagnosis present

## 2014-06-15 DIAGNOSIS — F341 Dysthymic disorder: Secondary | ICD-10-CM | POA: Diagnosis present

## 2014-06-15 MED ORDER — FENTANYL 25 MCG/HR TD PT72: 25.0000 ug | MEDICATED_PATCH | TRANSDERMAL | Status: DC

## 2014-06-15 MED ORDER — HYDROCODONE-ACETAMINOPHEN 10-325 MG PO TABS: 1.0000 | ORAL_TABLET | Freq: Four times a day (QID) | ORAL | Status: DC | PRN

## 2014-06-15 NOTE — Patient Instructions (Signed)
PLEASE CALL ME WITH ANY PROBLEMS OR QUESTIONS (#[phone]).      
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
## 2014-07-21 DIAGNOSIS — F4542 Pain disorder with related psychological factors: Secondary | ICD-10-CM | POA: Diagnosis not present

## 2014-07-21 DIAGNOSIS — F329 Major depressive disorder, single episode, unspecified: Secondary | ICD-10-CM

## 2014-07-21 DIAGNOSIS — F3289 Other specified depressive episodes: Secondary | ICD-10-CM | POA: Diagnosis not present

## 2014-07-28 ENCOUNTER — Telehealth: Payer: Self-pay | Admitting: Physical Medicine & Rehabilitation

## 2014-07-28 NOTE — Telephone Encounter (Signed)
PT WANTS HER NOTES FOR LAST 3 VISITS WITH DR Leonides CaveZELSON FAXED TO US FOR ZS, CALLED NEURO AND REQUESTED...JMN

## 2014-08-03 ENCOUNTER — Telehealth: Payer: Self-pay | Admitting: *Deleted

## 2014-08-03 NOTE — Telephone Encounter (Signed)
Kathryn Farrell is calling for refills on her pain meds.  She has appt 10/19 with Kathryn Farrell.  Is there anything available with swartz or eunice this week?  Please let me know so I can print rx if nothing available

## 2014-08-04 ENCOUNTER — Encounter: Payer: Self-pay | Admitting: Registered Nurse

## 2014-08-04 ENCOUNTER — Encounter: Payer: 59 | Attending: Physical Medicine & Rehabilitation | Admitting: Registered Nurse

## 2014-08-04 VITALS — BP 122/94 | HR 76 | Resp 14 | Ht 60.0 in | Wt 126.0 lb

## 2014-08-04 DIAGNOSIS — M609 Myositis, unspecified: Secondary | ICD-10-CM | POA: Insufficient documentation

## 2014-08-04 DIAGNOSIS — M791 Myalgia: Secondary | ICD-10-CM | POA: Insufficient documentation

## 2014-08-04 DIAGNOSIS — M961 Postlaminectomy syndrome, not elsewhere classified: Secondary | ICD-10-CM | POA: Diagnosis present

## 2014-08-04 DIAGNOSIS — G890 Central pain syndrome: Secondary | ICD-10-CM | POA: Diagnosis present

## 2014-08-04 DIAGNOSIS — M170 Bilateral primary osteoarthritis of knee: Secondary | ICD-10-CM

## 2014-08-04 DIAGNOSIS — M47812 Spondylosis without myelopathy or radiculopathy, cervical region: Secondary | ICD-10-CM | POA: Diagnosis present

## 2014-08-04 DIAGNOSIS — Z5181 Encounter for therapeutic drug level monitoring: Secondary | ICD-10-CM

## 2014-08-04 DIAGNOSIS — M47817 Spondylosis without myelopathy or radiculopathy, lumbosacral region: Secondary | ICD-10-CM | POA: Diagnosis present

## 2014-08-04 DIAGNOSIS — F418 Other specified anxiety disorders: Secondary | ICD-10-CM | POA: Diagnosis present

## 2014-08-04 DIAGNOSIS — Z79899 Other long term (current) drug therapy: Secondary | ICD-10-CM

## 2014-08-04 DIAGNOSIS — F341 Dysthymic disorder: Secondary | ICD-10-CM | POA: Diagnosis present

## 2014-08-04 DIAGNOSIS — M7061 Trochanteric bursitis, right hip: Secondary | ICD-10-CM

## 2014-08-04 DIAGNOSIS — M5412 Radiculopathy, cervical region: Secondary | ICD-10-CM

## 2014-08-04 DIAGNOSIS — IMO0001 Reserved for inherently not codable concepts without codable children: Secondary | ICD-10-CM

## 2014-08-04 MED ORDER — HYDROCODONE-ACETAMINOPHEN 10-325 MG PO TABS: 1.0000 | ORAL_TABLET | Freq: Four times a day (QID) | ORAL | Status: DC | PRN

## 2014-08-04 MED ORDER — FENTANYL 25 MCG/HR TD PT72: 25.0000 ug | MEDICATED_PATCH | TRANSDERMAL | Status: DC
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
## 2014-08-09 DIAGNOSIS — F329 Major depressive disorder, single episode, unspecified: Secondary | ICD-10-CM | POA: Diagnosis not present

## 2014-08-09 DIAGNOSIS — F4542 Pain disorder with related psychological factors: Secondary | ICD-10-CM | POA: Diagnosis not present

## 2014-08-15 ENCOUNTER — Ambulatory Visit: Payer: PRIVATE HEALTH INSURANCE | Admitting: Registered Nurse

## 2014-08-17 ENCOUNTER — Encounter: Payer: PRIVATE HEALTH INSURANCE | Admitting: Registered Nurse

## 2014-08-25 ENCOUNTER — Ambulatory Visit: Payer: Self-pay | Admitting: Internal Medicine

## 2014-08-29 ENCOUNTER — Encounter: Payer: Self-pay | Admitting: Registered Nurse

## 2014-09-28 ENCOUNTER — Encounter: Payer: Self-pay | Admitting: Registered Nurse

## 2014-09-28 ENCOUNTER — Encounter: Payer: 59 | Attending: Physical Medicine & Rehabilitation | Admitting: Registered Nurse

## 2014-09-28 VITALS — BP 99/63 | HR 74 | Resp 14 | Wt 129.6 lb

## 2014-09-28 DIAGNOSIS — F341 Dysthymic disorder: Secondary | ICD-10-CM | POA: Insufficient documentation

## 2014-09-28 DIAGNOSIS — M47817 Spondylosis without myelopathy or radiculopathy, lumbosacral region: Secondary | ICD-10-CM | POA: Insufficient documentation

## 2014-09-28 DIAGNOSIS — F4542 Pain disorder with related psychological factors: Secondary | ICD-10-CM | POA: Diagnosis not present

## 2014-09-28 DIAGNOSIS — F418 Other specified anxiety disorders: Secondary | ICD-10-CM

## 2014-09-28 DIAGNOSIS — M5412 Radiculopathy, cervical region: Secondary | ICD-10-CM | POA: Diagnosis present

## 2014-09-28 DIAGNOSIS — M47812 Spondylosis without myelopathy or radiculopathy, cervical region: Secondary | ICD-10-CM | POA: Diagnosis present

## 2014-09-28 DIAGNOSIS — M961 Postlaminectomy syndrome, not elsewhere classified: Secondary | ICD-10-CM | POA: Diagnosis present

## 2014-09-28 DIAGNOSIS — M609 Myositis, unspecified: Secondary | ICD-10-CM | POA: Diagnosis present

## 2014-09-28 DIAGNOSIS — G890 Central pain syndrome: Secondary | ICD-10-CM | POA: Diagnosis present

## 2014-09-28 DIAGNOSIS — F329 Major depressive disorder, single episode, unspecified: Secondary | ICD-10-CM | POA: Diagnosis not present

## 2014-09-28 DIAGNOSIS — M791 Myalgia: Secondary | ICD-10-CM

## 2014-09-28 DIAGNOSIS — IMO0001 Reserved for inherently not codable concepts without codable children: Secondary | ICD-10-CM

## 2014-09-28 DIAGNOSIS — Z5181 Encounter for therapeutic drug level monitoring: Secondary | ICD-10-CM | POA: Diagnosis present

## 2014-09-28 MED ORDER — FENTANYL 25 MCG/HR TD PT72: 25.0000 ug | MEDICATED_PATCH | TRANSDERMAL | Status: DC

## 2014-09-28 MED ORDER — HYDROCODONE-ACETAMINOPHEN 10-325 MG PO TABS: 1.0000 | ORAL_TABLET | Freq: Four times a day (QID) | ORAL | Status: DC | PRN
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
## 2014-10-04 ENCOUNTER — Ambulatory Visit: Payer: PRIVATE HEALTH INSURANCE | Admitting: Physical Medicine & Rehabilitation

## 2014-11-21 ENCOUNTER — Ambulatory Visit: Payer: PRIVATE HEALTH INSURANCE | Admitting: Physical Medicine & Rehabilitation

## 2014-11-25 ENCOUNTER — Other Ambulatory Visit: Payer: Self-pay | Admitting: Physical Medicine & Rehabilitation

## 2014-11-25 ENCOUNTER — Encounter: Payer: Self-pay | Admitting: Physical Medicine & Rehabilitation

## 2014-11-25 ENCOUNTER — Encounter: Payer: 59 | Attending: Physical Medicine & Rehabilitation | Admitting: Physical Medicine & Rehabilitation

## 2014-11-25 VITALS — BP 118/60 | HR 62 | Resp 14

## 2014-11-25 DIAGNOSIS — F341 Dysthymic disorder: Secondary | ICD-10-CM | POA: Diagnosis present

## 2014-11-25 DIAGNOSIS — M7060 Trochanteric bursitis, unspecified hip: Secondary | ICD-10-CM

## 2014-11-25 DIAGNOSIS — M609 Myositis, unspecified: Secondary | ICD-10-CM | POA: Diagnosis present

## 2014-11-25 DIAGNOSIS — F418 Other specified anxiety disorders: Secondary | ICD-10-CM | POA: Insufficient documentation

## 2014-11-25 DIAGNOSIS — M47812 Spondylosis without myelopathy or radiculopathy, cervical region: Secondary | ICD-10-CM | POA: Diagnosis present

## 2014-11-25 DIAGNOSIS — Z79899 Other long term (current) drug therapy: Secondary | ICD-10-CM

## 2014-11-25 DIAGNOSIS — M47817 Spondylosis without myelopathy or radiculopathy, lumbosacral region: Secondary | ICD-10-CM | POA: Insufficient documentation

## 2014-11-25 DIAGNOSIS — Z5181 Encounter for therapeutic drug level monitoring: Secondary | ICD-10-CM | POA: Diagnosis present

## 2014-11-25 DIAGNOSIS — G890 Central pain syndrome: Secondary | ICD-10-CM

## 2014-11-25 DIAGNOSIS — M5412 Radiculopathy, cervical region: Secondary | ICD-10-CM | POA: Diagnosis present

## 2014-11-25 DIAGNOSIS — M797 Fibromyalgia: Secondary | ICD-10-CM

## 2014-11-25 DIAGNOSIS — M791 Myalgia: Secondary | ICD-10-CM

## 2014-11-25 DIAGNOSIS — M47896 Other spondylosis, lumbar region: Secondary | ICD-10-CM

## 2014-11-25 DIAGNOSIS — M961 Postlaminectomy syndrome, not elsewhere classified: Secondary | ICD-10-CM | POA: Diagnosis present

## 2014-11-25 DIAGNOSIS — G894 Chronic pain syndrome: Secondary | ICD-10-CM

## 2014-11-25 DIAGNOSIS — IMO0001 Reserved for inherently not codable concepts without codable children: Secondary | ICD-10-CM

## 2014-11-25 DIAGNOSIS — M170 Bilateral primary osteoarthritis of knee: Secondary | ICD-10-CM

## 2014-11-25 MED ORDER — FENTANYL 25 MCG/HR TD PT72: 25.0000 ug | MEDICATED_PATCH | TRANSDERMAL | Status: DC

## 2014-11-25 MED ORDER — HYDROCODONE-ACETAMINOPHEN 10-325 MG PO TABS: 1.0000 | ORAL_TABLET | Freq: Four times a day (QID) | ORAL | Status: DC | PRN

## 2014-11-25 MED ORDER — FENTANYL 25 MCG/HR TD PT72
25.0000 ug | MEDICATED_PATCH | TRANSDERMAL | Status: DC
Start: 2014-11-25 — End: 2015-01-17

## 2014-11-25 NOTE — Progress Notes (Signed)
Subjective:    Patient ID: Kathryn Farrell, female    DOB: 20-Jul-1961, 54 y.o.   MRN: [id number]  HPI   Kathryn Farrell is back regarding her chronic pain. She has found a lot of benefit with the visits to Dr. Leonides Cave. Her husband has been coming for visits as well.,   She had her cervical fusion shortly after I last saw her. Things have gone fairly well although she is still having pain and doing exercises at home.   Her knee swelling is better but she still has pain there as well as her right hip. She tries to stretch and use heat when she can. She uses a cane for balance.   Pain Inventory Average Pain 9 Pain Right Now 9 My pain is constant, sharp, burning, stabbing, tingling and aching  In the last 24 hours, has pain interfered with the following? General activity 10 Relation with others 9 Enjoyment of life 10 What TIME of day is your pain at its worst? ALL Sleep (in general) Poor  Pain is worse with: walking, bending, sitting, standing and some activites Pain improves with: rest, heat/ice, medication, TENS and injections Relief from Meds: 6  Mobility use a cane how many minutes can you walk? 5 ability to climb steps?  yes do you drive?  yes  Function disabled: date disabled .  Neuro/Psych No problems in this area weakness tingling spasms dizziness confusion depression anxiety  Prior Studies Any changes since last visit?  no  Physicians involved in your care Any changes since last visit?  no   Family History  Problem Relation Age of Onset  . COPD Mother   . Asthma Mother   . Cancer Father     esophageal cander with mets  . Diabetes Brother   . Stroke Brother    History   Social History  . Marital Status: Married    Spouse Name: N/A    Number of Children: N/A  . Years of Education: N/A   Social History Main Topics  . Smoking status: Former Smoker    Quit date: 10/28/1996  . Smokeless tobacco: Never Used  . Alcohol Use: No  . Drug Use: No  .  Sexual Activity: None   Other Topics Concern  . None   Social History Narrative   Past Surgical History  Procedure Laterality Date  . Abdominal hysterectomy  1989  . Arm surgery      "muscle came off the elbow"  . Cesarean section    . Spine surgery  2008    cervical fusion  . Knee arthroscopy  2004/2008    x2  . Upper gi endoscopy  04-27-13    Dr [name]  . Colonoscopy  2008  . Cholecystectomy     Past Medical History  Diagnosis Date  . GERD (gastroesophageal reflux disease)   . Neuromuscular disorder 2009    centralized pain syndrome/fibromylagia  . Osteoporosis   . C. difficile colitis 2008  . Colon polyps 2008  . Hiatal hernia 2014  . Chronic cholecystitis 05/07/2013   BP 118/60 mmHg  Pulse 62  Resp 14  SpO2 96%  Opioid Risk Score:   Fall Risk Score: Moderate Fall Risk (6-13 points)  Review of Systems  HENT: Negative.   Eyes: Negative.   Respiratory: Negative.   Cardiovascular: Negative.   Gastrointestinal: Negative.   Endocrine: Negative.   Musculoskeletal: Positive for myalgias, back pain and arthralgias.  Skin: Negative.   Allergic/Immunologic: Negative.   Neurological: Positive  for weakness and numbness.       Tingling, spasms  Hematological: Negative.   Psychiatric/Behavioral: Positive for dysphoric mood. The patient is nervous/anxious.        Objective:   Physical Exam Constitutional: She is oriented to person, place, and time. She appears well-developed. She may have gained a little weight.  Eyes: Conjunctivae and EOM are normal. Pupils are equal, round, and reactive to light.  Neck: Normal range of motion.  Cardiovascular: Normal rate and regular rhythm.  Pulmonary/Chest: Effort normal.  Abdominal: Soft.  Neurological: She is alert and oriented to person, place, and time. dtr's are 1-2 +. No sensory changes. Strength in all 4 limbs intact at 5/5.  Marland Kitchen. Mild loss of LT in ulnar half of each hand. Grip is stable.  Musc: tenderness from C6 through  mid thoracic spine with palpation. She has substantial antalgia with weight bearing on the right knee.  Psych: pleasant, attention issues still.   Skin: wound healed on right leg.    Assessment & Plan:   ASSESSMENT:  1. Centralized pain syndrome/ FMS  2. Lumbar spondylosis. With facet and disc disease. Left L5 radiculopathy (improved)  3. Bilateral greater trochanter bursitis.  4. Osteoarthritis, right knee and left knee. S/p scope right knee 10/26/13  5. Right forearm extensor mechanism injury.  6. 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  7. Insomnia  8. Depression.  9. Left shoulder pain, likely RTC/labaral injury with bursitis.        PLAN:  1. Continued management of cervical symptoms per surgery. rom  2. Encouraged her to be cautions with scheduled surgeries (ie not schedule too many too soon)  3. Hydrocodone for breakthrough pain. 10/325 with second RF for next month  4. Continue fentanyl patch to 25mcg for base line pain with second RF for next month 5. Neuro-psych treatment has been helpful. She should continue---she is better able to realize that her pain management is a holistic process. Urged her to continue exercise to tolerance.  The pool would be an excellent option for her if she has access.  6. Continue trazodone to 100mg  To help with sleep and mood.  7. Gloves for Raynaud symptoms in hands. Recommended use of neoprene leg sleeve as well.  9. Zanaflex for muscle spasm in the thoracic and lumbar spine.  10. 30 minutes of face to face patient care time were spent during this visit. All questions were encouraged and answered.

## 2014-11-25 NOTE — Patient Instructions (Signed)
PLEASE CALL ME WITH ANY PROBLEMS OR QUESTIONS (#[phone]).      

## 2014-11-26 LAB — PMP ALCOHOL METABOLITE (ETG): ETGU: NEGATIVE ng/mL

## 2014-12-01 LAB — OPIATES/OPIOIDS (LC/MS-MS)
Codeine Urine: NEGATIVE ng/mL (ref <50)
HYDROCODONE: 950 ng/mL (ref <50)
Hydromorphone: 451 ng/mL (ref <50)
MORPHINE: NEGATIVE ng/mL (ref <50)
NORHYDROCODONE, UR: 1294 ng/mL (ref <50)
Noroxycodone, Ur: NEGATIVE ng/mL (ref <50)
OXYMORPHONE, URINE: NEGATIVE ng/mL (ref <50)
Oxycodone, ur: NEGATIVE ng/mL (ref <50)

## 2014-12-01 LAB — FENTANYL (GC/LC/MS), URINE
FENTANYL (GC/MS) CONFIRM: 5.3 ng/mL (ref <0.5)
Norfentanyl, confirm: 47.4 ng/mL (ref <0.5)

## 2014-12-01 LAB — BENZODIAZEPINES (GC/LC/MS), URINE
Alprazolam metabolite (GC/LC/MS), ur confirm: 121 ng/mL (ref <25)
Clonazepam metabolite (GC/LC/MS), ur confirm: NEGATIVE ng/mL (ref <25)
Flurazepam metabolite (GC/LC/MS), ur confirm: NEGATIVE ng/mL (ref <50)
Lorazepam (GC/LC/MS), ur confirm: NEGATIVE ng/mL (ref <50)
Midazolam (GC/LC/MS), ur confirm: NEGATIVE ng/mL (ref <50)
Nordiazepam (GC/LC/MS), ur confirm: NEGATIVE ng/mL (ref <50)
Oxazepam (GC/LC/MS), ur confirm: NEGATIVE ng/mL (ref <50)
TEMAZEPAMU: NEGATIVE ng/mL (ref <50)
Triazolam metabolite (GC/LC/MS), ur confirm: NEGATIVE ng/mL (ref <50)

## 2014-12-02 LAB — PRESCRIPTION MONITORING PROFILE (SOLSTAS)
Amphetamine/Meth: NEGATIVE ng/mL
BARBITURATE SCREEN, URINE: NEGATIVE ng/mL
Buprenorphine, Urine: NEGATIVE ng/mL
COCAINE METABOLITES: NEGATIVE ng/mL
Cannabinoid Scrn, Ur: NEGATIVE ng/mL
Carisoprodol, Urine: NEGATIVE ng/mL
Creatinine, Urine: 39.58 mg/dL (ref >20.0)
MDMA URINE: NEGATIVE ng/mL
MEPERIDINE UR: NEGATIVE ng/mL
METHADONE SCREEN, URINE: NEGATIVE ng/mL
Nitrites, Initial: NEGATIVE ug/mL
Oxycodone Screen, Ur: NEGATIVE ng/mL
PH URINE, INITIAL: 7 pH (ref 4.5–8.9)
Propoxyphene: NEGATIVE ng/mL
TAPENTADOLUR: NEGATIVE ng/mL
TRAMADOL UR: NEGATIVE ng/mL
Zolpidem, Urine: NEGATIVE ng/mL

## 2014-12-18 NOTE — Telephone Encounter (Signed)
Na

## 2014-12-23 NOTE — Progress Notes (Signed)
Urine drug screen for this encounter is consistent for prescribed medication 

## 2015-01-09 ENCOUNTER — Telehealth: Payer: Self-pay | Admitting: *Deleted

## 2015-01-09 MED ORDER — CITALOPRAM HYDROBROMIDE 20 MG PO TABS: 20.0000 mg | ORAL_TABLET | Freq: Every day | ORAL | Status: DC

## 2015-01-09 NOTE — Telephone Encounter (Signed)
Need refill on her citalopram.(done)  Wanted Dr Riley KillSwartz to know that she is having another MRI of her neck.

## 2015-01-10 ENCOUNTER — Ambulatory Visit: Payer: PRIVATE HEALTH INSURANCE | Admitting: Psychology

## 2015-01-17 ENCOUNTER — Encounter: Payer: Self-pay | Admitting: Physical Medicine & Rehabilitation

## 2015-01-17 ENCOUNTER — Ambulatory Visit: Payer: PRIVATE HEALTH INSURANCE | Attending: Psychology | Admitting: Psychology

## 2015-01-17 ENCOUNTER — Encounter: Payer: 59 | Attending: Physical Medicine & Rehabilitation | Admitting: Physical Medicine & Rehabilitation

## 2015-01-17 VITALS — BP 114/75 | HR 80 | Resp 14

## 2015-01-17 DIAGNOSIS — F4542 Pain disorder with related psychological factors: Secondary | ICD-10-CM

## 2015-01-17 DIAGNOSIS — M47812 Spondylosis without myelopathy or radiculopathy, cervical region: Secondary | ICD-10-CM

## 2015-01-17 DIAGNOSIS — M5412 Radiculopathy, cervical region: Secondary | ICD-10-CM | POA: Diagnosis present

## 2015-01-17 DIAGNOSIS — M47817 Spondylosis without myelopathy or radiculopathy, lumbosacral region: Secondary | ICD-10-CM | POA: Diagnosis present

## 2015-01-17 DIAGNOSIS — Z5181 Encounter for therapeutic drug level monitoring: Secondary | ICD-10-CM | POA: Diagnosis present

## 2015-01-17 DIAGNOSIS — G890 Central pain syndrome: Secondary | ICD-10-CM | POA: Insufficient documentation

## 2015-01-17 DIAGNOSIS — M7060 Trochanteric bursitis, unspecified hip: Secondary | ICD-10-CM

## 2015-01-17 DIAGNOSIS — F329 Major depressive disorder, single episode, unspecified: Secondary | ICD-10-CM | POA: Diagnosis not present

## 2015-01-17 DIAGNOSIS — F341 Dysthymic disorder: Secondary | ICD-10-CM | POA: Diagnosis present

## 2015-01-17 DIAGNOSIS — M7520 Bicipital tendinitis, unspecified shoulder: Secondary | ICD-10-CM

## 2015-01-17 DIAGNOSIS — M791 Myalgia: Secondary | ICD-10-CM

## 2015-01-17 DIAGNOSIS — M609 Myositis, unspecified: Secondary | ICD-10-CM | POA: Insufficient documentation

## 2015-01-17 DIAGNOSIS — F32A Depression, unspecified: Secondary | ICD-10-CM

## 2015-01-17 DIAGNOSIS — M961 Postlaminectomy syndrome, not elsewhere classified: Secondary | ICD-10-CM | POA: Insufficient documentation

## 2015-01-17 DIAGNOSIS — F418 Other specified anxiety disorders: Secondary | ICD-10-CM | POA: Insufficient documentation

## 2015-01-17 DIAGNOSIS — M47816 Spondylosis without myelopathy or radiculopathy, lumbar region: Secondary | ICD-10-CM

## 2015-01-17 DIAGNOSIS — IMO0001 Reserved for inherently not codable concepts without codable children: Secondary | ICD-10-CM

## 2015-01-17 DIAGNOSIS — M5090 Cervical disc disorder, unspecified, unspecified cervical region: Secondary | ICD-10-CM

## 2015-01-17 DIAGNOSIS — M1711 Unilateral primary osteoarthritis, right knee: Secondary | ICD-10-CM

## 2015-01-17 MED ORDER — HYDROCODONE-ACETAMINOPHEN 10-325 MG PO TABS: 1.0000 | ORAL_TABLET | Freq: Four times a day (QID) | ORAL | Status: DC | PRN

## 2015-01-17 MED ORDER — FENTANYL 25 MCG/HR TD PT72: 25.0000 ug | MEDICATED_PATCH | TRANSDERMAL | Status: DC

## 2015-01-17 NOTE — Patient Instructions (Signed)
Gabapentin: 100mg  at bedtime for 4 days If no problems then increase to 200mg  at bedtime If no problems then add an AM dose (100mg ) in addition to the night time dose

## 2015-01-17 NOTE — Progress Notes (Signed)
[facility]  [address]   Telephone [phone] [address] Fax [phone] [place], [place] [address]   Psychology Progress Note   Name:  Kathryn BoltSherry L Farrell Date of Birth:  05/04/1961 MRN:  [id number]  Date:01/17/2015 (6034m) psychotherapy  Background: Kathryn CloseSherry Farrell is a 54 year old woman who was referred for psychological evaluation and treatment in 2015 by Faith RogueZachary Swartz, MD of [facility]. Dr. Riley KillSwartz has been treating [name] since 2008 for multiple pain problems. 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 Dr. Riley KillSwartz stated his opinion that her high levels of anxiety and depression have likely been exacerbating her pain and pain-related disability.  She was initially evaluated by the undersigned clinician on 02/02/14. She was last seen on 09/28/14. She has been scheduling counseling sessions on an as needed basis.   Further information can be found in her "paper chart".   Chief Complaints: "I feel defeated some days".   She reported that she had planned for her husband to join her today but he could not as she inadvertently told him the wrong date of this appointment.  She reported that after a several month period of reduced pain, she began to experience new left arm pain, numbness and tingling in her left hand fingers and a return of "crippling" headache pain about five weeks ago. Results of a cervical MRI scan are pending. She reported that she may possibly need another cervical surgery. A new prescription for gabapentin has been helpful in reducing her pain. Her current pain relief medications also include fentanyl patch, hydrocodone for breakthrough pain and Zanaflex for muscle spasms. She takes trazodone for sleep.  She reported that her pain level continues to  disrupt her ability to perform everyday physical tasks as well as her ability to enjoy sexual relations with her husband. She has not been able to drive much lately due to the sedating side effects of gabapentin.   She did not report any major changes in her social situation or new stressors. She described her husband as supportive as usual. She can be open about her situation with one of her daughters.  She reported that a disability hearing has been scheduled for 02/21/15.  Observations:  She appeared appropriately dressed and groomed. She did not display signs of pain behavior. She presented with a tendency to ramble, which has been typical of her. She exhibited brief tearfulness when she spoke of her sense of lowered self-worth. She denied signs of mood instability, thoughts of suicide or homicide, substance abuse or medication misuse, or psychotic symptoms.  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 She reported that she continues to self-monitor how much she talks about her pain and has tried to curtail excessive or exclusive focus on pain as we had discussed in prior meetings. She continues to rely on reciting and reflecting on Bible verses and statements of affirmation to help her cope. She reports a high level of confidence in and satisfaction with her physicians.   Plan: She will continue to schedule psychological treatment sessions on an as needed basis.    Gladstone PihMichael F. Coleta Grosshans, Ph.D Licensed Psychologist

## 2015-01-17 NOTE — Progress Notes (Signed)
Subjective:    Patient ID: Kathryn Farrell, female    DOB: 01/21/1961, 54 y.o.   MRN: [id number]  HPI   Kathryn Farrell is back regarding her chronic pain. She has had pain radiating down her left arm for about 6 weeks now. She has had numbness in the median half of her left hand, occasionally the ulnar half of the hand as well. She reports increased headaches additionally. She has been back on gabapentin per her PCP for about a week. She's taking 200mg  at night but is not always taking it consistently.   She reports pain in her left buttocks now (as opposed to the right side) worsened with general activities.    Kathryn Farrell continues to struggle with depression and anxiety. Her pending disability case is scheduled for May. She sees Dr. Leonides CaveZelson today in follow up. She feels that he has helped her quite a bit.    Pain Inventory Average Pain 10 Pain Right Now 10 My pain is constant, sharp, burning, stabbing, tingling and aching  In the last 24 hours, has pain interfered with the following? General activity 10 Relation with others 10 Enjoyment of life 10 What TIME of day is your pain at its worst? daytime evening and night Sleep (in general) Poor  Pain is worse with: walking, bending, sitting, standing and some activites Pain improves with: rest, heat/ice, medication, TENS and injections Relief from Meds: 7  Up 3-4 nights week with pain  Mobility use a cane ability to climb steps?  no  Function disabled: date disabled 10/28/2012 I need assistance with the following:  meal prep, household duties and shopping  Neuro/Psych weakness numbness tingling trouble walking spasms dizziness confusion depression anxiety  Prior Studies Any changes since last visit?  yes  Had her neck surgery and now opposite arm hurting her  Physicians involved in your care Any changes since last visit?  no   Family History  Problem Relation Age of Onset  . COPD Mother   . Asthma Mother   . Cancer Father     esophageal cander with mets  . Diabetes Brother   . Stroke Brother    History   Social History  . Marital Status: Married    Spouse Name: N/A  . Number of Children: N/A  . Years of Education: N/A   Social History Main Topics  . Smoking status: Former Smoker    Quit date: 10/28/1996  . Smokeless tobacco: Never Used  . Alcohol Use: No  . Drug Use: No  . Sexual Activity: Not on file   Other Topics Concern  . None   Social History Narrative   Past Surgical History  Procedure Laterality Date  . Abdominal hysterectomy  1989  . Arm surgery      "muscle came off the elbow"  . Cesarean section    . Spine surgery  2008    cervical fusion  . Knee arthroscopy  2004/2008    x2  . Upper gi endoscopy  04-27-13    Dr Tracey HarriesBlazer  . Colonoscopy  2008  . Cholecystectomy     Past Medical History  Diagnosis Date  . GERD (gastroesophageal reflux disease)   . Neuromuscular disorder 2009    centralized pain syndrome/fibromylagia  . Osteoporosis   . C. difficile colitis 2008  . Colon polyps 2008  . Hiatal hernia 2014  . Chronic cholecystitis 05/07/2013   BP 114/75 mmHg  Pulse 80  Resp 14  SpO2 98%  Opioid Risk Score:  Fall Risk Score: Moderate Fall Risk (6-13 points) (previoulsy educated and given handout)`1  Depression screen PHQ 2/9  Depression screen PHQ 2/9 01/17/2015  Decreased Interest 3  Down, Depressed, Hopeless 3  PHQ - 2 Score 6  Altered sleeping 3  Tired, decreased energy 3  Change in appetite 3  Feeling bad or failure about yourself  3  Trouble concentrating 3  Moving slowly or fidgety/restless 3  Suicidal thoughts 0  PHQ-9 Score 24    Review of Systems  Constitutional: Positive for diaphoresis.  Respiratory: Positive for shortness of breath.   Gastrointestinal: Positive for nausea.  Musculoskeletal: Positive for gait problem.       Spasms  Neurological: Positive for dizziness, weakness, numbness and headaches.       Tingling  Psychiatric/Behavioral:  Positive for confusion and dysphoric mood. The patient is nervous/anxious.   All other systems reviewed and are negative.      Objective:   Physical Exam   Constitutional: She is oriented to person, place, and time. She appears well-developed. She may have gained a little weight.  Eyes: Conjunctivae and EOM are normal. Pupils are equal, round, and reactive to light.  Neck: Normal range of motion.  Cardiovascular: Normal rate and regular rhythm.  Pulmonary/Chest: Effort normal.  Abdominal: Soft.  Neurological: She is alert and oriented to person, place, and time. dtr's are 2+ RUE, 2+ bicep,ECR and 1+ tricep LUE. ? sensory changes left hand in c7 dermatome. Strength in all 4 limbs intact at 5/5. Marland Kitchen Mild loss of LT in ulnar half of each hand. Grip is stable.  Musc: tenderness from C6 through mid thoracic spine with palpation. She has substantial antalgia with weight bearing on the right knee.  Psych: pleasant, attention issues still. Anxious.  Skin: wound healed on right leg.   Assessment & Plan:   ASSESSMENT:  1. Centralized pain syndrome/ FMS  2. Lumbar spondylosis. With facet and disc disease. Left L5 radiculopathy (improved)  3. Bilateral greater trochanter bursitis.  4. Osteoarthritis, right knee and left knee. S/p scope right knee 10/26/13  5. Right forearm extensor mechanism injury.  6. 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 Likely left C7 radiculitis 7. Insomnia  8. Depression.  9. Left shoulder pain, likely RTC/labaral injury with bursitis.    PLAN:  1. Continued management of cervical symptoms per surgery.  -follow up MRI per PCP is pending  -symptoms appear to be radiculopathy related (may be chronic due to left C7 nerve root irritation)  -recommended SCHEDULED use of gabapentin. Recommended a slow titration schedule. 2. Encouraged her to be cautions with scheduled surgeries (ie not schedule too many too soon)  3. Hydrocodone for  breakthrough pain. 10/325 with second RF for next month  4. Continue fentanyl patch to for base line pain with second RF for next month  5. Neuro-psych treatment to continue. Discussed the impacts of her mood on her pain once again. 6. Continue trazodone to . Sleep is paramount for her. 7. Gloves for Raynaud symptoms in hands.    9. Zanaflex for muscle spasm in the thoracic and lumbar spine.  10. 30 minutes of face to face patient care time were spent during this visit. All questions were encouraged and answered.

## 2015-02-13 ENCOUNTER — Other Ambulatory Visit: Payer: Self-pay | Admitting: *Deleted

## 2015-02-13 DIAGNOSIS — G4700 Insomnia, unspecified: Secondary | ICD-10-CM

## 2015-02-13 DIAGNOSIS — F418 Other specified anxiety disorders: Secondary | ICD-10-CM

## 2015-02-13 DIAGNOSIS — G890 Central pain syndrome: Secondary | ICD-10-CM

## 2015-02-13 MED ORDER — TRAZODONE HCL 100 MG PO TABS: 100.0000 mg | ORAL_TABLET | Freq: Every day | ORAL | Status: DC

## 2015-02-13 NOTE — Telephone Encounter (Signed)
Recd fax refill request for Trazadone 100 mg #30.  Take 1 tablet at bedtime.  #RF 3 - sent in electronically

## 2015-02-17 NOTE — Op Note (Signed)
PATIENT NAME:  Kathryn Farrell, Kathryn Farrell MR#:  [id number] DATE OF BIRTH:  May 13, 1961  DATE OF PROCEDURE:  05/07/2013  PREOPERATIVE DIAGNOSIS: Chronic right upper quadrant pain.   POSTOPERATIVE DIAGNOSIS: Chronic right upper quadrant pain.   OPERATIVE PROCEDURE: Laparoscopic cholecystectomy with intraoperative cholangiograms.   SURGEON: Donnalee CurryJeffrey Perseus Westall, MD   ANESTHESIA: General endotracheal under Dr. Dimple Caseyice.   ESTIMATED BLOOD LOSS: Minimal.   CLINICAL NOTE: This 54 year old woman has had chronic and accelerating right upper quadrant pain, primarily postprandial. Ultrasound was negative. HIDA scan showed a normal ejection fraction but reproduction of her symptoms during CCK injection. [facility] (hiatal hernia without inflammation). Options for management were reviewed. The pros and cons of elective cholecystectomy were discussed. The inability to promise relief of her symptoms with cholecystectomy was confirmed.   DESCRIPTION OF PROCEDURE: With the patient under adequate general endotracheal anesthesia, the abdomen was prepped with ChloraPrep and draped. In Trendelenburg position, a Veress needle was placed through a transumbilical incision. After assuring intra-abdominal location with the hanging drop test, the abdomen was insufflated with CO2 at 10 mmHg pressure. A 10 mm step port was expanded. Inspection showed that the port was just below a filmy layer of omentum adherent to the anterior abdominal wall. This was repositioned without difficulty. An 11 mm Xcel port was placed in the epigastrium. Moving the camera to this area showed no evidence of injury from initial port placement. Two 5 mm step ports were placed laterally. The gallbladder was placed on cephalad traction. The fundus of the gallbladder was of unremarkable Robin's egg blue color. The neck of the gallbladder showed chronic inflammation and adhesions of the omentum and duodenum to the area. These  adhesions were taken down with gentle dissection. Cautery was used for hemostasis. The neck of the gallbladder was cleared. Fluoroscopic cholangiograms were completed using 20 mL of one half strength Conray 60. This showed prompt filling of the right and left hepatic ducts and free flow into the duodenum. The cystic duct and cystic artery were doubly clipped and divided. The gallbladder was removed from the liver bed making use of hook cautery dissection and then delivered through the epigastric port site. The right upper quadrant was irrigated. Good hemostasis was appreciated. The camera was moved to the epigastric site and reinspection of the umbilical port again showed only small amount of air beneath the omentum, but no evidence of intestinal injury. The abdomen was then desufflated under direct vision. Skin incisions were closed with 4-0 Vicryl subcuticular sutures. Benzoin, Steri-Strips, Telfa and Tegaderm dressing was applied.   The patient tolerated the procedure well and was taken to the recovery room in stable condition.  ____________________________ Earline MayotteJeffrey W. Tannon Peerson, MD jwb:sb D: 05/07/2013 12:26:04 ET T: 05/07/2013 12:48:31 ET JOB#: [id number]  cc: Earline MayotteJeffrey W. Annaleese Guier, MD, <Dictator> Barbette ReichmannVishwanath Hande, MD Jaquavius Hudler Brion AlimentW Lamark Schue MD ELECTRONICALLY SIGNED 05/07/2013 22:37

## 2015-02-28 ENCOUNTER — Ambulatory Visit: Payer: PRIVATE HEALTH INSURANCE | Admitting: Psychology

## 2015-03-17 ENCOUNTER — Encounter: Payer: Self-pay | Admitting: Physical Medicine & Rehabilitation

## 2015-03-17 ENCOUNTER — Encounter: Payer: 59 | Attending: Physical Medicine & Rehabilitation | Admitting: Physical Medicine & Rehabilitation

## 2015-03-17 ENCOUNTER — Other Ambulatory Visit: Payer: Self-pay | Admitting: Physical Medicine & Rehabilitation

## 2015-03-17 VITALS — BP 113/72 | HR 74 | Resp 14

## 2015-03-17 DIAGNOSIS — F341 Dysthymic disorder: Secondary | ICD-10-CM | POA: Insufficient documentation

## 2015-03-17 DIAGNOSIS — G890 Central pain syndrome: Secondary | ICD-10-CM | POA: Insufficient documentation

## 2015-03-17 DIAGNOSIS — IMO0001 Reserved for inherently not codable concepts without codable children: Secondary | ICD-10-CM

## 2015-03-17 DIAGNOSIS — M961 Postlaminectomy syndrome, not elsewhere classified: Secondary | ICD-10-CM | POA: Diagnosis present

## 2015-03-17 DIAGNOSIS — Z79899 Other long term (current) drug therapy: Secondary | ICD-10-CM

## 2015-03-17 DIAGNOSIS — M5412 Radiculopathy, cervical region: Secondary | ICD-10-CM | POA: Insufficient documentation

## 2015-03-17 DIAGNOSIS — M47817 Spondylosis without myelopathy or radiculopathy, lumbosacral region: Secondary | ICD-10-CM

## 2015-03-17 DIAGNOSIS — Z5181 Encounter for therapeutic drug level monitoring: Secondary | ICD-10-CM | POA: Diagnosis not present

## 2015-03-17 DIAGNOSIS — G894 Chronic pain syndrome: Secondary | ICD-10-CM | POA: Diagnosis not present

## 2015-03-17 DIAGNOSIS — M47812 Spondylosis without myelopathy or radiculopathy, cervical region: Secondary | ICD-10-CM | POA: Insufficient documentation

## 2015-03-17 DIAGNOSIS — M791 Myalgia: Secondary | ICD-10-CM | POA: Diagnosis present

## 2015-03-17 DIAGNOSIS — M609 Myositis, unspecified: Secondary | ICD-10-CM

## 2015-03-17 DIAGNOSIS — F418 Other specified anxiety disorders: Secondary | ICD-10-CM | POA: Diagnosis present

## 2015-03-17 MED ORDER — HYDROCODONE-ACETAMINOPHEN 10-325 MG PO TABS: 1.0000 | ORAL_TABLET | Freq: Four times a day (QID) | ORAL | Status: DC | PRN

## 2015-03-17 MED ORDER — CITALOPRAM HYDROBROMIDE 40 MG PO TABS: 40.0000 mg | ORAL_TABLET | Freq: Every day | ORAL | Status: DC

## 2015-03-17 MED ORDER — FENTANYL 25 MCG/HR TD PT72: 25.0000 ug | MEDICATED_PATCH | TRANSDERMAL | Status: DC

## 2015-03-17 NOTE — Progress Notes (Signed)
Subjective:    Patient ID: Kathryn BoltSherry L Farrell, female    DOB: 01/13/1961, 54 y.o.   MRN: [id number]  HPI   Kathryn [name] is back regarding her chronic pain. She was approved for disability.  She did not tolerate the gabapentin---caused chest pain/palpitations.   She feels that she's been having more shortness of breath.   Her neck surgeon told her that her fusion did not fuse and she will need more surgery.     Pain Inventory Average Pain 10 Pain Right Now 10 My pain is sharp, burning, stabbing, tingling and aching  In the last 24 hours, has pain interfered with the following? General activity 10 Relation with others 10 Enjoyment of life 10 What TIME of day is your pain at its worst? all Sleep (in general) Fair  Pain is worse with: walking, bending, sitting, inactivity and standing Pain improves with: heat/ice, medication, TENS and injections Relief from Meds: 6  Mobility walk with assistance use a cane ability to climb steps?  no do you drive?  yes Do you have any goals in this area?  yes  Function disabled: date disabled . I need assistance with the following:  meal prep, household duties and shopping  Neuro/Psych weakness numbness tingling trouble walking spasms confusion depression anxiety  Prior Studies Any changes since last visit?  no  Physicians involved in your care Any changes since last visit?  no   Family History  Problem Relation Age of Onset  . COPD Mother   . Asthma Mother   . Cancer Father     esophageal cander with mets  . Diabetes Brother   . Stroke Brother    History   Social History  . Marital Status: Married    Spouse Name: N/A  . Number of Children: N/A  . Years of Education: N/A   Social History Main Topics  . Smoking status: Former Smoker    Quit date: 10/28/1996  . Smokeless tobacco: Never Used  . Alcohol Use: No  . Drug Use: No  . Sexual Activity: Not on file   Other Topics Concern  . None   Social History  Narrative   Past Surgical History  Procedure Laterality Date  . Abdominal hysterectomy  1989  . Arm surgery      "muscle came off the elbow"  . Cesarean section    . Spine surgery  2008    cervical fusion  . Knee arthroscopy  2004/2008    x2  . Upper gi endoscopy  04-27-13    Dr Tracey HarriesBlazer  . Colonoscopy  2008  . Cholecystectomy     Past Medical History  Diagnosis Date  . GERD (gastroesophageal reflux disease)   . Neuromuscular disorder 2009    centralized pain syndrome/fibromylagia  . Osteoporosis   . C. difficile colitis 2008  . Colon polyps 2008  . Hiatal hernia 2014  . Chronic cholecystitis 05/07/2013   BP 113/72 mmHg  Pulse 74  Resp 14  SpO2 98%  Opioid Risk Score:   Fall Risk Score: Moderate Fall Risk (6-13 points)`1  Depression screen PHQ 2/9  Depression screen PHQ 2/9 01/17/2015  Decreased Interest 3  Down, Depressed, Hopeless 3  PHQ - 2 Score 6  Altered sleeping 3  Tired, decreased energy 3  Change in appetite 3  Feeling bad or failure about yourself  3  Trouble concentrating 3  Moving slowly or fidgety/restless 3  Suicidal thoughts 0  PHQ-9 Score 24     Review of  Systems  Constitutional:       Poor appetite  Respiratory: Positive for shortness of breath.   Gastrointestinal: Positive for nausea.  Neurological: Positive for weakness and numbness.       Tingling Spasms   Psychiatric/Behavioral: Positive for confusion and dysphoric mood. The patient is nervous/anxious.   All other systems reviewed and are negative.      Objective:   Physical Exam  Constitutional: She is oriented to person, place, and time. She appears well-developed. She may have gained a little weight.  Eyes: Conjunctivae and EOM are normal. Pupils are equal, round, and reactive to light.  Neck: Normal range of motion.  Cardiovascular: Normal rate and regular rhythm.  Pulmonary/Chest: Effort normal.  Abdominal: Soft.  Neurological: She is alert and oriented to person, place,  and time. dtr's are 2+ RUE, 2+ bicep,ECR and 1+ tricep LUE. ? sensory changes left hand in c7 dermatome. Strength in all 4 limbs intact at 5/5. Marland Kitchen. Mild loss of LT in ulnar half of each hand. Grip is stable.  Musc: tenderness from C6 through mid thoracic spine with palpation. She has substantial antalgia with weight bearing on the right knee.  Psych: pleasant, attention issues still. Anxious.  Skin: wound healed on right leg.  Assessment & Plan:   ASSESSMENT:  1. Centralized pain syndrome/ FMS  2. Lumbar spondylosis. With facet and disc disease. Left L5 radiculopathy (improved)  3. Bilateral greater trochanter bursitis.  4. Osteoarthritis, right knee and left knee. S/p scope right knee 10/26/13  5. Right forearm extensor mechanism injury.  6. 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 Likely left C7 radiculitis  7. Insomnia  8. Depression.  9. Left shoulder pain, likely RTC/labaral injury with bursitis.    PLAN:  1. Continued management of cervical symptoms per surgery.  -may need further surgery 2. Encouraged her to be cautions with scheduled surgeries (ie not schedule too many too soon)  3. Hydrocodone for breakthrough pain. 10/325 with second RF for next month  4. Continue fentanyl patch to 25mcg for base line pain with second RF for next month  5. Neuro-psych treatment to continue. Discussed the impacts of her mood on her pain once again. Increased celexa to 40mg  6. Continue trazodone to 100mg . Sleep is paramount for her.  7. Gloves for Raynaud symptoms in hands.  9. Zanaflex for muscle spasm in the thoracic and lumbar spine.  10. 30 minutes of face to face patient care time were spent during this visit. All questions were encouraged and answered.

## 2015-03-17 NOTE — Patient Instructions (Signed)
YOU HAVE TO CONTINUE WORKING ON WAYS TO DISTRACT AND GET YOUR MIND OFF YOUR PAIN.

## 2015-03-18 LAB — PMP ALCOHOL METABOLITE (ETG): ETGU: NEGATIVE ng/mL

## 2015-03-23 LAB — BENZODIAZEPINES (GC/LC/MS), URINE
ALPRAZOLAMU: 137 ng/mL (ref <25)
Clonazepam metabolite (GC/LC/MS), ur confirm: NEGATIVE ng/mL (ref <25)
FLURAZEPAMU: NEGATIVE ng/mL (ref <50)
Lorazepam (GC/LC/MS), ur confirm: NEGATIVE ng/mL (ref <50)
Midazolam (GC/LC/MS), ur confirm: NEGATIVE ng/mL (ref <50)
Nordiazepam (GC/LC/MS), ur confirm: NEGATIVE ng/mL (ref <50)
OXAZEPAMU: NEGATIVE ng/mL (ref <50)
TEMAZEPAMU: NEGATIVE ng/mL (ref <50)
Triazolam metabolite (GC/LC/MS), ur confirm: NEGATIVE ng/mL (ref <50)

## 2015-03-23 LAB — OPIATES/OPIOIDS (LC/MS-MS)
CODEINE URINE: NEGATIVE ng/mL (ref <50)
HYDROMORPHONE: 603 ng/mL (ref <50)
Hydrocodone: 1398 ng/mL (ref <50)
Morphine Urine: NEGATIVE ng/mL (ref <50)
NORHYDROCODONE, UR: 1868 ng/mL (ref <50)
Noroxycodone, Ur: NEGATIVE ng/mL (ref <50)
Oxycodone, ur: NEGATIVE ng/mL (ref <50)
Oxymorphone: NEGATIVE ng/mL (ref <50)

## 2015-03-23 LAB — FENTANYL (GC/LC/MS), URINE
FENTANYL (GC/MS) CONFIRM: 17.1 ng/mL (ref <0.5)
NORFENTANYL (GC/MS) CONFIRM: 35.4 ng/mL (ref <0.5)

## 2015-03-24 LAB — PRESCRIPTION MONITORING PROFILE (SOLSTAS)
Amphetamine/Meth: NEGATIVE ng/mL
Barbiturate Screen, Urine: NEGATIVE ng/mL
Buprenorphine, Urine: NEGATIVE ng/mL
CANNABINOID SCRN UR: NEGATIVE ng/mL
CARISOPRODOL, URINE: NEGATIVE ng/mL
CREATININE, URINE: 42.24 mg/dL (ref >20.0)
Cocaine Metabolites: NEGATIVE ng/mL
MDMA URINE: NEGATIVE ng/mL
Meperidine, Ur: NEGATIVE ng/mL
Methadone Screen, Urine: NEGATIVE ng/mL
Nitrites, Initial: NEGATIVE ug/mL
OXYCODONE SCRN UR: NEGATIVE ng/mL
PROPOXYPHENE: NEGATIVE ng/mL
Tapentadol, urine: NEGATIVE ng/mL
Tramadol Scrn, Ur: NEGATIVE ng/mL
Zolpidem, Urine: NEGATIVE ng/mL
pH, Initial: 5.1 pH (ref 4.5–8.9)

## 2015-03-29 NOTE — Progress Notes (Signed)
Urine drug screen for this encounter is consistent for prescribed medication 

## 2015-04-12 ENCOUNTER — Telehealth: Payer: Self-pay | Admitting: *Deleted

## 2015-04-12 NOTE — Telephone Encounter (Signed)
noted 

## 2015-04-12 NOTE — Telephone Encounter (Signed)
Pt at [facility], surgery on her neck is completed. Surgeon prescribed oxycodone 5 mg. Pt is calling to inform.

## 2015-05-17 ENCOUNTER — Encounter: Payer: Self-pay | Admitting: Physical Medicine & Rehabilitation

## 2015-05-17 ENCOUNTER — Encounter: Payer: 59 | Attending: Physical Medicine & Rehabilitation | Admitting: Physical Medicine & Rehabilitation

## 2015-05-17 VITALS — BP 101/68 | HR 73 | Resp 14

## 2015-05-17 DIAGNOSIS — M47817 Spondylosis without myelopathy or radiculopathy, lumbosacral region: Secondary | ICD-10-CM | POA: Diagnosis present

## 2015-05-17 DIAGNOSIS — F341 Dysthymic disorder: Secondary | ICD-10-CM | POA: Diagnosis present

## 2015-05-17 DIAGNOSIS — M7061 Trochanteric bursitis, right hip: Secondary | ICD-10-CM

## 2015-05-17 DIAGNOSIS — G890 Central pain syndrome: Secondary | ICD-10-CM | POA: Diagnosis present

## 2015-05-17 DIAGNOSIS — M961 Postlaminectomy syndrome, not elsewhere classified: Secondary | ICD-10-CM | POA: Insufficient documentation

## 2015-05-17 DIAGNOSIS — M47896 Other spondylosis, lumbar region: Secondary | ICD-10-CM

## 2015-05-17 DIAGNOSIS — M609 Myositis, unspecified: Secondary | ICD-10-CM | POA: Insufficient documentation

## 2015-05-17 DIAGNOSIS — Z5181 Encounter for therapeutic drug level monitoring: Secondary | ICD-10-CM | POA: Diagnosis present

## 2015-05-17 DIAGNOSIS — M791 Myalgia: Secondary | ICD-10-CM | POA: Diagnosis present

## 2015-05-17 DIAGNOSIS — F418 Other specified anxiety disorders: Secondary | ICD-10-CM | POA: Diagnosis present

## 2015-05-17 DIAGNOSIS — M5412 Radiculopathy, cervical region: Secondary | ICD-10-CM

## 2015-05-17 DIAGNOSIS — IMO0001 Reserved for inherently not codable concepts without codable children: Secondary | ICD-10-CM

## 2015-05-17 DIAGNOSIS — M47812 Spondylosis without myelopathy or radiculopathy, cervical region: Secondary | ICD-10-CM | POA: Diagnosis present

## 2015-05-17 DIAGNOSIS — M7062 Trochanteric bursitis, left hip: Secondary | ICD-10-CM

## 2015-05-17 MED ORDER — HYDROCODONE-ACETAMINOPHEN 10-325 MG PO TABS
1.0000 | ORAL_TABLET | Freq: Four times a day (QID) | ORAL | Status: DC | PRN
Start: 2015-05-17 — End: 2015-05-17

## 2015-05-17 MED ORDER — FENTANYL 25 MCG/HR TD PT72: 25.0000 ug | MEDICATED_PATCH | TRANSDERMAL | Status: DC

## 2015-05-17 MED ORDER — HYDROCODONE-ACETAMINOPHEN 10-325 MG PO TABS: 1.0000 | ORAL_TABLET | Freq: Four times a day (QID) | ORAL | Status: DC | PRN

## 2015-05-17 NOTE — Progress Notes (Signed)
Subjective:    Patient ID: Kathryn Farrell, female    DOB: Feb 28, 1961, 54 y.o.   MRN: [id number]  HPI   Kathryn Farrell is here in follow up of her chronic pain. Her levels have been fairly stable.   She had cervical fusion 6 weeks ago, and she has noticed improvement in her hand pain and paresthesias. She still has some weakness in her hands.  She is back to swimming. It made her sore initially, but she feels that there has been improvement over the last couple weeks.   She remains on medications as prescribed. Counts has been consistent. Her surgeon prescribed her oxycodone which she is no longer taking.    Pain Inventory Average Pain 7 Pain Right Now 7 My pain is sharp, burning, tingling and aching  In the last 24 hours, has pain interfered with the following? General activity 7 Relation with others 7 Enjoyment of life 7 What TIME of day is your pain at its worst? morning Sleep (in general) Fair  Pain is worse with: walking, bending, sitting and standing Pain improves with: rest, medication and injections Relief from Meds: 6  Mobility use a cane how many minutes can you walk? a little ability to climb steps?  no do you drive?  yes  Function disabled: date disabled . I need assistance with the following:  meal prep, household duties and shopping  Neuro/Psych weakness numbness trouble walking spasms depression anxiety  Prior Studies Any changes since last visit?  no  Physicians involved in your care Any changes since last visit?  no   Family History  Problem Relation Age of Onset  . COPD Mother   . Asthma Mother   . Cancer Father     esophageal cander with mets  . Diabetes Brother   . Stroke Brother    History   Social History  . Marital Status: Married    Spouse Name: N/A  . Number of Children: N/A  . Years of Education: N/A   Social History Main Topics  . Smoking status: Former Smoker    Quit date: 10/28/1996  . Smokeless tobacco: Never Used  .  Alcohol Use: No  . Drug Use: No  . Sexual Activity: Not on file   Other Topics Concern  . None   Social History Narrative   Past Surgical History  Procedure Laterality Date  . Abdominal hysterectomy  1989  . Arm surgery      "muscle came off the elbow"  . Cesarean section    . Spine surgery  2008    cervical fusion  . Knee arthroscopy  2004/2008    x2  . Upper gi endoscopy  04-27-13    Dr [name]  . Colonoscopy  2008  . Cholecystectomy     Past Medical History  Diagnosis Date  . GERD (gastroesophageal reflux disease)   . Neuromuscular disorder 2009    centralized pain syndrome/fibromylagia  . Osteoporosis   . C. difficile colitis 2008  . Colon polyps 2008  . Hiatal hernia 2014  . Chronic cholecystitis 05/07/2013   BP 101/68 mmHg  Pulse 73  Resp 14  SpO2 95%  Opioid Risk Score:   Fall Risk Score:  `1  Depression screen PHQ 2/9  Depression screen PHQ 2/9 01/17/2015  Decreased Interest 3  Down, Depressed, Hopeless 3  PHQ - 2 Score 6  Altered sleeping 3  Tired, decreased energy 3  Change in appetite 3  Feeling bad or failure about yourself  3  Trouble concentrating 3  Moving slowly or fidgety/restless 3  Suicidal thoughts 0  PHQ-9 Score 24     Review of Systems  Respiratory: Positive for apnea.   Gastrointestinal: Positive for nausea.  Musculoskeletal: Positive for gait problem.  Neurological: Positive for weakness and numbness.       Spasms   Psychiatric/Behavioral: Positive for dysphoric mood. The patient is nervous/anxious.        Objective:   Physical Exam Constitutional: She is oriented to person, place, and time. She appears well-developed. She may have gained a little weight.  Eyes: Conjunctivae and EOM are normal. Pupils are equal, round, and reactive to light.  Neck: Normal range of motion.  Cardiovascular: Normal rate and regular rhythm.  Pulmonary/Chest: Effort normal.  Abdominal: Soft.  Neurological: She is alert and oriented to  person, place, and time. dtr's are 2+ RUE, 2+ bicep,ECR and 1+ tricep LUE. ? sensory changes left hand in c7 dermatome. Strength 5/5 in LUE---left shoulder a little weak but pain component.  Mild loss of LT in ulnar half of each hand. Grip is stable.  Musc: tenderness from C6 through mid thoracic spine with palpation. She has substantial antalgia with weight bearing on the right knee.  Psych: pleasant,much less anxious today---bright!  Skin: wound healed on right leg. Minimal surgical scars neck   Assessment & Plan:   ASSESSMENT:  1. Centralized pain syndrome/ FMS  2. Lumbar spondylosis. With facet and disc disease. Left L5 radiculopathy (improved)  3. Bilateral greater trochanter bursitis.  4. Osteoarthritis, right knee and left knee. S/p scope right knee 10/26/13  5. Right forearm extensor mechanism injury.  6. 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 Likely left C7 radiculitis  7. Insomnia  8. Depression.  9. Left shoulder pain, likely RTC/labaral injury with bursitis.   PLAN:  1. Continue post-op plan per surgery. Has experienced improvement since surgery!! 2. Encouraged her to be cautions with scheduled surgeries (ie not schedule too many too soon)  3. Hydrocodone for breakthrough pain. 10/325 with second RF for next month  4. Continue fentanyl patch to 25mcg for base line pain with second RF for next month  5. Neuro-psych treatment to continue.  Maintain celexa at current dosing.  6. Continue trazodone to 100mg . Sleep has improved.   7. Gloves for Raynaud symptoms in hands.  9. Zanaflex for muscle spasm in the thoracic and lumbar spine.  10. 15 minutes of face to face patient care time were spent during this visit. All questions were encouraged and answered.

## 2015-05-17 NOTE — Patient Instructions (Signed)
PLEASE CALL ME WITH ANY PROBLEMS OR QUESTIONS (#[phone]).  HAVE A GOOD DAY!    

## 2015-07-18 ENCOUNTER — Other Ambulatory Visit: Payer: Self-pay | Admitting: Physical Medicine & Rehabilitation

## 2015-07-18 ENCOUNTER — Encounter: Payer: 59 | Attending: Physical Medicine & Rehabilitation | Admitting: Physical Medicine & Rehabilitation

## 2015-07-18 ENCOUNTER — Encounter: Payer: Self-pay | Admitting: Physical Medicine & Rehabilitation

## 2015-07-18 VITALS — BP 113/58 | HR 64

## 2015-07-18 DIAGNOSIS — M5412 Radiculopathy, cervical region: Secondary | ICD-10-CM | POA: Diagnosis present

## 2015-07-18 DIAGNOSIS — F341 Dysthymic disorder: Secondary | ICD-10-CM | POA: Diagnosis present

## 2015-07-18 DIAGNOSIS — M47812 Spondylosis without myelopathy or radiculopathy, cervical region: Secondary | ICD-10-CM | POA: Diagnosis present

## 2015-07-18 DIAGNOSIS — Z5181 Encounter for therapeutic drug level monitoring: Secondary | ICD-10-CM | POA: Insufficient documentation

## 2015-07-18 DIAGNOSIS — M961 Postlaminectomy syndrome, not elsewhere classified: Secondary | ICD-10-CM | POA: Insufficient documentation

## 2015-07-18 DIAGNOSIS — IMO0001 Reserved for inherently not codable concepts without codable children: Secondary | ICD-10-CM

## 2015-07-18 DIAGNOSIS — M609 Myositis, unspecified: Secondary | ICD-10-CM | POA: Insufficient documentation

## 2015-07-18 DIAGNOSIS — M47817 Spondylosis without myelopathy or radiculopathy, lumbosacral region: Secondary | ICD-10-CM | POA: Diagnosis present

## 2015-07-18 DIAGNOSIS — G890 Central pain syndrome: Secondary | ICD-10-CM | POA: Insufficient documentation

## 2015-07-18 DIAGNOSIS — Z79899 Other long term (current) drug therapy: Secondary | ICD-10-CM

## 2015-07-18 DIAGNOSIS — M791 Myalgia: Secondary | ICD-10-CM | POA: Insufficient documentation

## 2015-07-18 DIAGNOSIS — F418 Other specified anxiety disorders: Secondary | ICD-10-CM | POA: Insufficient documentation

## 2015-07-18 MED ORDER — HYDROCODONE-ACETAMINOPHEN 10-325 MG PO TABS: 1.0000 | ORAL_TABLET | Freq: Four times a day (QID) | ORAL | Status: DC | PRN

## 2015-07-18 MED ORDER — FENTANYL 25 MCG/HR TD PT72: 25.0000 ug | MEDICATED_PATCH | TRANSDERMAL | Status: DC

## 2015-07-18 NOTE — Progress Notes (Signed)
Subjective:    Patient ID: Kathryn Farrell, female    DOB: 1960-12-10, 54 y.o.   MRN: [id number]  HPI   British is back regarding her chronic pain. She has been doing fairly well. However, she is having some headaches again. 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 She has her good and bad days. Her headaches are in the neck and occipital area.   She initially had improvement in her neck pain and numbness in the left hand but feels that the numbness has been more regular over the last few weeks.   She is here with her husband today who remains very supportive for her.   Kathryn Farrell continues on the fenttanyl patch as well as the hydrocodone for breakthrough pain. These do a reasonable job keepin her levels controllable.   Pain Inventory Average Pain 7 Pain Right Now 7 My pain is sharp, burning, stabbing, tingling and aching  In the last 24 hours, has pain interfered with the following? General activity 7 Relation with others 7 Enjoyment of life 6 What TIME of day is your pain at its worst? all Sleep (in general) Fair  Pain is worse with: walking, bending, sitting and standing Pain improves with: heat/ice and medication Relief from Meds: 5  Mobility use a cane how many minutes can you walk? not long because of pain ability to climb steps?  no do you drive?  yes  Function disabled: date disabled 10/28/12 I need assistance with the following:  household duties and shopping  Neuro/Psych weakness numbness tingling trouble walking spasms anxiety  Prior Studies Any changes since last visit?  no  Physicians involved in your care Any changes since last visit?  no   Family History  Problem Relation Age of Onset  . COPD Mother   . Asthma Mother   . Cancer Father     esophageal cander with mets  . Diabetes Brother   . Stroke Brother    Social History   Social History  . Marital Status: Married    Spouse Name: N/A  . Number of Children: N/A  . Years of Education:  N/A   Social History Main Topics  . Smoking status: Former Smoker    Quit date: 10/28/1996  . Smokeless tobacco: Never Used  . Alcohol Use: No  . Drug Use: No  . Sexual Activity: Not Asked   Other Topics Concern  . None   Social History Narrative   Past Surgical History  Procedure Laterality Date  . Abdominal hysterectomy  1989  . Arm surgery      "muscle came off the elbow"  . Cesarean section    . Spine surgery  2008    cervical fusion  . Knee arthroscopy  2004/2008    x2  . Upper gi endoscopy  04-27-13    Dr [name]  . Colonoscopy  2008  . Cholecystectomy     Past Medical History  Diagnosis Date  . GERD (gastroesophageal reflux disease)   . Neuromuscular disorder 2009    centralized pain syndrome/fibromylagia  . Osteoporosis   . C. difficile colitis 2008  . Colon polyps 2008  . Hiatal hernia 2014  . Chronic cholecystitis 05/07/2013   BP 113/58 mmHg  Pulse 64  SpO2 98%  Opioid Risk Score:   Fall Risk Score:  `1  Depression screen PHQ 2/9  Depression screen [facility] 2/9 07/18/2015 01/17/2015  Decreased Interest 3 3  Down, Depressed, Hopeless 2 3  PHQ - 2 Score 5 6  Altered sleeping - 3  Tired, decreased energy - 3  Change in appetite - 3  Feeling bad or failure about yourself  - 3  Trouble concentrating - 3  Moving slowly or fidgety/restless - 3  Suicidal thoughts - 0  PHQ-9 Score - 24     Review of Systems  Respiratory: Positive for apnea.   Musculoskeletal:       Spasms  Neurological: Positive for weakness and numbness.       Tingling  Psychiatric/Behavioral: The patient is nervous/anxious.   All other systems reviewed and are negative.      Objective:   Physical Exam  Constitutional: She is oriented to person, place, and time. She appears well-developed. She may have gained a little weight.  Eyes: Conjunctivae and EOM are normal. Pupils are equal, round, and reactive to light.  Neck: Normal range of motion.  Cardiovascular: Normal rate and  regular rhythm.  Pulmonary/Chest: Effort normal.  Abdominal: Soft.  Neurological: She is alert and oriented to person, place, and time. dtr's are 2+ RUE, 2+ bicep,ECR and 1+ tricep LUE. ? sensory changes left hand in c7 dermatome. Strength 5/5 in LUE---left shoulder a little weak but pain component. Mild loss of LT in ulnar half of each hand. Grip is stable.  Musc: tenderness from C6 through mid thoracic spine with palpation. She has substantial antalgia with weight bearing on the right knee.  Psych: pleasant,much less anxious today---bright!  Skin: wound healed on right leg. Minimal surgical scars neck   Assessment & Plan:   ASSESSMENT:  1. Centralized pain syndrome/ FMS  2. Lumbar spondylosis. With facet and disc disease. Left L5 radiculopathy (improved)  3. Bilateral greater trochanter bursitis.  4. Osteoarthritis, right knee and left knee. S/p scope right knee 10/26/13  5. Right forearm extensor mechanism injury.  6. 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 Likely left C7 radiculitis  7. Insomnia  8. Depression.  9. Left shoulder pain, likely RTC/labaral injury with bursitis.    PLAN:  1. If sensory loss worsens in her left hand, she should contact her surgeon---at this point, her strength, neuro exam are stable and it may be a waxing waning of her chronic radic.  2. Discussed scapular rom exercises and reasonable activities. These were provided today   3. Hydrocodone for breakthrough pain. 10/325 with second RF for next month  4. Continue fentanyl patch to for base line pain with second RF for next month  5. Neuro-psych treatment to continue. Maintain celexa at current dosing.  6. Continue trazodone to . Sleep has improved.  7. Gloves for Raynaud symptoms in hands.  9. Zanaflex for muscle spasm in the thoracic and lumbar spine.  10. 25 minutes of face to face patient care time were spent during this visit. All questions were encouraged and  answered.

## 2015-07-19 LAB — PMP ALCOHOL METABOLITE (ETG): ETGU: NEGATIVE ng/mL

## 2015-07-22 LAB — BENZODIAZEPINES (GC/LC/MS), URINE
ALPRAZOLAMU: 166 ng/mL (ref <25)
Clonazepam metabolite (GC/LC/MS), ur confirm: NEGATIVE ng/mL (ref <25)
Flurazepam metabolite (GC/LC/MS), ur confirm: NEGATIVE ng/mL (ref <50)
Lorazepam (GC/LC/MS), ur confirm: NEGATIVE ng/mL (ref <50)
MIDAZOLAMU: NEGATIVE ng/mL (ref <50)
Nordiazepam (GC/LC/MS), ur confirm: NEGATIVE ng/mL (ref <50)
Oxazepam (GC/LC/MS), ur confirm: NEGATIVE ng/mL (ref <50)
TRIAZOLAMU: NEGATIVE ng/mL (ref <50)
Temazepam (GC/LC/MS), ur confirm: NEGATIVE ng/mL (ref <50)

## 2015-07-22 LAB — OPIATES/OPIOIDS (LC/MS-MS)
CODEINE URINE: NEGATIVE ng/mL (ref <50)
HYDROCODONE: 2651 ng/mL (ref <50)
HYDROMORPHONE: 619 ng/mL (ref <50)
MORPHINE: NEGATIVE ng/mL (ref <50)
NORHYDROCODONE, UR: 2048 ng/mL (ref <50)
Noroxycodone, Ur: NEGATIVE ng/mL (ref <50)
Oxycodone, ur: NEGATIVE ng/mL (ref <50)
Oxymorphone: NEGATIVE ng/mL (ref <50)

## 2015-07-22 LAB — FENTANYL (GC/LC/MS), URINE
FENTANYL (GC/MS) CONFIRM: 12.5 ng/mL (ref <0.5)
Norfentanyl, confirm: 53.8 ng/mL (ref <0.5)

## 2015-07-25 LAB — PRESCRIPTION MONITORING PROFILE (SOLSTAS)
AMPHETAMINE/METH: NEGATIVE ng/mL
BARBITURATE SCREEN, URINE: NEGATIVE ng/mL
Buprenorphine, Urine: NEGATIVE ng/mL
CANNABINOID SCRN UR: NEGATIVE ng/mL
COCAINE METABOLITES: NEGATIVE ng/mL
Carisoprodol, Urine: NEGATIVE ng/mL
Creatinine, Urine: 53.62 mg/dL (ref >20.0)
MDMA URINE: NEGATIVE ng/mL
Meperidine, Ur: NEGATIVE ng/mL
Methadone Screen, Urine: NEGATIVE ng/mL
Nitrites, Initial: NEGATIVE ug/mL
Oxycodone Screen, Ur: NEGATIVE ng/mL
PH URINE, INITIAL: 5.4 pH (ref 4.5–8.9)
Propoxyphene: NEGATIVE ng/mL
TAPENTADOLUR: NEGATIVE ng/mL
Tramadol Scrn, Ur: NEGATIVE ng/mL
Zolpidem, Urine: NEGATIVE ng/mL

## 2015-08-24 NOTE — Progress Notes (Signed)
Urine drug screen for this encounter is consistent for prescribed medication 

## 2015-09-13 ENCOUNTER — Encounter: Payer: 59 | Attending: Physical Medicine & Rehabilitation | Admitting: Physical Medicine & Rehabilitation

## 2015-09-13 ENCOUNTER — Encounter: Payer: Self-pay | Admitting: Physical Medicine & Rehabilitation

## 2015-09-13 VITALS — BP 105/77 | HR 73

## 2015-09-13 DIAGNOSIS — M7060 Trochanteric bursitis, unspecified hip: Secondary | ICD-10-CM | POA: Diagnosis not present

## 2015-09-13 DIAGNOSIS — F418 Other specified anxiety disorders: Secondary | ICD-10-CM

## 2015-09-13 DIAGNOSIS — M5412 Radiculopathy, cervical region: Secondary | ICD-10-CM | POA: Insufficient documentation

## 2015-09-13 DIAGNOSIS — M533 Sacrococcygeal disorders, not elsewhere classified: Secondary | ICD-10-CM

## 2015-09-13 DIAGNOSIS — Z5181 Encounter for therapeutic drug level monitoring: Secondary | ICD-10-CM | POA: Diagnosis present

## 2015-09-13 DIAGNOSIS — G890 Central pain syndrome: Secondary | ICD-10-CM | POA: Diagnosis present

## 2015-09-13 DIAGNOSIS — M47812 Spondylosis without myelopathy or radiculopathy, cervical region: Secondary | ICD-10-CM | POA: Diagnosis present

## 2015-09-13 DIAGNOSIS — M961 Postlaminectomy syndrome, not elsewhere classified: Secondary | ICD-10-CM | POA: Diagnosis not present

## 2015-09-13 DIAGNOSIS — M4726 Other spondylosis with radiculopathy, lumbar region: Secondary | ICD-10-CM

## 2015-09-13 DIAGNOSIS — M47817 Spondylosis without myelopathy or radiculopathy, lumbosacral region: Secondary | ICD-10-CM | POA: Diagnosis present

## 2015-09-13 DIAGNOSIS — M791 Myalgia: Secondary | ICD-10-CM | POA: Diagnosis present

## 2015-09-13 DIAGNOSIS — F341 Dysthymic disorder: Secondary | ICD-10-CM | POA: Diagnosis present

## 2015-09-13 DIAGNOSIS — M609 Myositis, unspecified: Secondary | ICD-10-CM | POA: Diagnosis present

## 2015-09-13 DIAGNOSIS — IMO0001 Reserved for inherently not codable concepts without codable children: Secondary | ICD-10-CM

## 2015-09-13 MED ORDER — FENTANYL 25 MCG/HR TD PT72: 25.0000 ug | MEDICATED_PATCH | TRANSDERMAL | Status: DC

## 2015-09-13 MED ORDER — PREDNISONE 20 MG PO TABS: 20.0000 mg | ORAL_TABLET | ORAL | Status: DC

## 2015-09-13 MED ORDER — HYDROCODONE-ACETAMINOPHEN 10-325 MG PO TABS: 1.0000 | ORAL_TABLET | Freq: Four times a day (QID) | ORAL | Status: DC | PRN

## 2015-09-13 NOTE — Progress Notes (Signed)
Subjective:    Patient ID: Kathryn Farrell, female    DOB: [birth year], 54 y.o.   MRN: [id number]  HPI   Kathryn Farrell is here in follow up of her chronic pain. She has had more pain in her right buttock over the last 3 weeks. It came on insidiously, and she has had a difficult time lying down, sitting, or standing for prolonged periods of time. Her medications don't seem to be helping this pain as much. She is not doing any new stretches.   She has followed up with her spine surgeon. Her surgical site has made a solid fusion. She feels that her upper extremity strength and sensory symptoms are improved somewhat. They certainly haven't worsened.  She continues on pain medications as we prescribe them. [name] uses a cane for balance.    Pain Inventory Average Pain 7 Pain Right Now 8 My pain is sharp, burning, stabbing and tingling  In the last 24 hours, has pain interfered with the following? General activity 7 Relation with others 7 Enjoyment of life 6 What TIME of day is your pain at its worst? all the time Sleep (in general) Fair  Pain is worse with: walking, bending, sitting and standing Pain improves with: rest, heat/ice, medication, TENS and injections Relief from Meds: 7  Mobility use a cane how many minutes can you walk? 5 ability to climb steps?  no do you drive?  yes Do you have any goals in this area?  yes  Function disabled: date disabled 10/28/12 I need assistance with the following:  meal prep, household duties and shopping Do you have any goals in this area?  yes  Neuro/Psych weakness numbness tingling trouble walking spasms confusion depression anxiety  Prior Studies Any changes since last visit?  no  Physicians involved in your care Dr Hyacinth Meeker, Dr Rise Mu, Dr Carolynne Edouard   Family History  Problem Relation Age of Onset  . COPD Mother   . Asthma Mother   . Cancer Father     esophageal cander with mets  . Diabetes Brother   . Stroke Brother    Social  History   Social History  . Marital Status: Married    Spouse Name: N/A  . Number of Children: N/A  . Years of Education: N/A   Social History Main Topics  . Smoking status: Former Smoker    Quit date: 10/28/1996  . Smokeless tobacco: Never Used  . Alcohol Use: No  . Drug Use: No  . Sexual Activity: Not Asked   Other Topics Concern  . None   Social History Narrative   Past Surgical History  Procedure Laterality Date  . Abdominal hysterectomy  1989  . Arm surgery      "muscle came off the elbow"  . Cesarean section    . Spine surgery  2008    cervical fusion  . Knee arthroscopy  2004/2008    x2  . Upper gi endoscopy  04-27-13    Dr [name]  . Colonoscopy  2008  . Cholecystectomy     Past Medical History  Diagnosis Date  . GERD (gastroesophageal reflux disease)   . Neuromuscular disorder (HCC) 2009    centralized pain syndrome/fibromylagia  . Osteoporosis   . C. difficile colitis 2008  . Colon polyps 2008  . Hiatal hernia 2014  . Chronic cholecystitis 05/07/2013   There were no vitals taken for this visit.  Opioid Risk Score:   Fall Risk Score:  `1  Depression screen PHQ  2/9  Depression screen PHQ 2/9 07/18/2015 01/17/2015  Decreased Interest 3 3  Down, Depressed, Hopeless 2 3  PHQ - 2 Score 5 6  Altered sleeping - 3  Tired, decreased energy - 3  Change in appetite - 3  Feeling bad or failure about yourself  - 3  Trouble concentrating - 3  Moving slowly or fidgety/restless - 3  Suicidal thoughts - 0  PHQ-9 Score - 24      Review of Systems  Respiratory: Positive for shortness of breath.   Gastrointestinal: Positive for nausea and abdominal pain.  Musculoskeletal: Positive for myalgias, back pain, arthralgias, gait problem and neck pain.  Neurological: Positive for weakness.  All other systems reviewed and are negative.      Objective:   Physical Exam  Constitutional: She is oriented to person, place, and time. She appears well-developed. She  may have gained a little weight.  Eyes: Conjunctivae and EOM are normal. Pupils are equal, round, and reactive to light.  Neck: Normal range of motion.  Cardiovascular: Normal rate and regular rhythm.  Pulmonary/Chest: Effort normal.  Abdominal: Soft.  Neurological: She is alert and oriented to person, place, and time. dtr's are 2+ RUE, 2+ bicep,ECR and 1+ tricep LUE. ? sensory changes left hand in c7 dermatome. Strength 5/5 in LUE---left shoulder a little weak but pain component. Mild loss of LT in ulnar half of each hand. Grip is stable.  Musc: pain with palpation of the lower sacrum and near the right PSIS. Patrick's maneuver + on the right, equivocal on the left. She had hip pain in either hip with ROM. She is limited still with lumbar ROM in all planes. Compression test actually improved pain. Psych: pleasant,much less anxious today---bright!  Skin: wound healed on right leg. Minimal surgical scars neck   Assessment & Plan:   ASSESSMENT:  1. Centralized pain syndrome/ FMS  2. Lumbar spondylosis. With facet and disc disease. Left L5 radiculopathy (improved)  3. Bilateral greater trochanter bursitis.  4. Osteoarthritis, right knee and left knee. S/p scope right knee 10/26/13  5. Right forearm extensor mechanism injury.  6. 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 Likely left C7 radiculitis  7. Insomnia  8. Depression.  9. Left shoulder pain, likely RTC/labaral injury with bursitis.  10. Right sacral pain. SIJ?   PLAN:  1. UE exam stable--she is being followed by her surgeon . her strength, neuro exam are stable and appear to be a more chronic radic.  2. SI exercises were provided today. Steroid taper rx'ed. Will consider further intervention depending upon response 3. Hydrocodone for breakthrough pain. 10/325 with second RF for next month  4. Continue fentanyl patch to 25mcg for base line pain with second RF for next month  5. Neuro-psych treatment to  continue. Maintain celexa at current dosing.  6. Continue trazodone to 100mg . Sleep has improved.  7. Gloves for Raynaud symptoms in hands.  9. Zanaflex for muscle spasm in the thoracic and lumbar spine.  10. 25 minutes of face to face patient care time were spent during this visit. All questions were encouraged and answered.

## 2015-11-08 ENCOUNTER — Encounter: Payer: Self-pay | Admitting: Physical Medicine & Rehabilitation

## 2015-11-08 ENCOUNTER — Encounter: Payer: 59 | Attending: Physical Medicine & Rehabilitation | Admitting: Physical Medicine & Rehabilitation

## 2015-11-08 ENCOUNTER — Ambulatory Visit
Admission: RE | Admit: 2015-11-08 | Discharge: 2015-11-08 | Disposition: A | Discharge status: Home / Self Care | Payer: 59 | Source: Ambulatory Visit | Attending: Physical Medicine & Rehabilitation | Admitting: Physical Medicine & Rehabilitation

## 2015-11-08 VITALS — BP 94/62 | HR 68 | Resp 14

## 2015-11-08 DIAGNOSIS — F341 Dysthymic disorder: Secondary | ICD-10-CM | POA: Diagnosis present

## 2015-11-08 DIAGNOSIS — M533 Sacrococcygeal disorders, not elsewhere classified: Secondary | ICD-10-CM

## 2015-11-08 DIAGNOSIS — M47817 Spondylosis without myelopathy or radiculopathy, lumbosacral region: Secondary | ICD-10-CM

## 2015-11-08 DIAGNOSIS — M961 Postlaminectomy syndrome, not elsewhere classified: Secondary | ICD-10-CM | POA: Diagnosis present

## 2015-11-08 DIAGNOSIS — IMO0001 Reserved for inherently not codable concepts without codable children: Secondary | ICD-10-CM

## 2015-11-08 DIAGNOSIS — Z5181 Encounter for therapeutic drug level monitoring: Secondary | ICD-10-CM | POA: Insufficient documentation

## 2015-11-08 DIAGNOSIS — M609 Myositis, unspecified: Secondary | ICD-10-CM | POA: Insufficient documentation

## 2015-11-08 DIAGNOSIS — M791 Myalgia: Secondary | ICD-10-CM | POA: Diagnosis present

## 2015-11-08 DIAGNOSIS — F418 Other specified anxiety disorders: Secondary | ICD-10-CM | POA: Insufficient documentation

## 2015-11-08 DIAGNOSIS — M47812 Spondylosis without myelopathy or radiculopathy, cervical region: Secondary | ICD-10-CM | POA: Diagnosis present

## 2015-11-08 DIAGNOSIS — G890 Central pain syndrome: Secondary | ICD-10-CM | POA: Diagnosis present

## 2015-11-08 DIAGNOSIS — M5412 Radiculopathy, cervical region: Secondary | ICD-10-CM | POA: Insufficient documentation

## 2015-11-08 DIAGNOSIS — Z79899 Other long term (current) drug therapy: Secondary | ICD-10-CM

## 2015-11-08 MED ORDER — FENTANYL 25 MCG/HR TD PT72: 25.0000 ug | MEDICATED_PATCH | TRANSDERMAL | Status: DC

## 2015-11-08 MED ORDER — HYDROCODONE-ACETAMINOPHEN 10-325 MG PO TABS: 1.0000 | ORAL_TABLET | Freq: Four times a day (QID) | ORAL | Status: DC | PRN

## 2015-11-08 NOTE — Addendum Note (Signed)
Addended by: Doreene ElandSHUMAKER, SYBIL W on: 11/08/2015 02:12 PM   Modules accepted: Orders

## 2015-11-08 NOTE — Patient Instructions (Signed)
  PLEASE CALL ME WITH ANY PROBLEMS OR QUESTIONS (#[phone]).      

## 2015-11-08 NOTE — Addendum Note (Signed)
Addended by: Doreene ElandSHUMAKER, SYBIL W on: 11/08/2015 02:01 PM   Modules accepted: Orders

## 2015-11-08 NOTE — Addendum Note (Signed)
Addended by: Faith RogueSWARTZ, ZACHARY T on: 11/08/2015 02:08 PM   Modules accepted: Orders

## 2015-11-08 NOTE — Progress Notes (Signed)
Subjective:    Patient ID: Kathryn Farrell, female    DOB: 03/24/1961, 55 y.o.   MRN: [id number]  HPI   Kathryn Farrell is here in follow up of her chronic pain. She is still having pain on her "butt bone" from the fall last November. Her pain is primarily on the right side of her low back. The pain goes up into her low back and into her right buttock. She had positive results with the steroid taper over the first week or two but since then the pain has returned to its prior level. She is doing her stretches daily.   From a cervical standpoint she's doing great. Her surgeon released her. Her headaches have improved and she's gaining strength back in her arms.   She maintains on medications as we've prescribed and is compliant with directions.   Pain Inventory Average Pain 7 Pain Right Now 7 My pain is sharp, burning, stabbing, tingling and aching  In the last 24 hours, has pain interfered with the following? General activity 7 Relation with others 7 Enjoyment of life 7 What TIME of day is your pain at its worst? morning, daytime, evening, night Sleep (in general) NA  Pain is worse with: walking, bending, sitting and standing Pain improves with: heat/ice, medication and injections Relief from Meds: 6  Mobility use a cane how many minutes can you walk? not long ability to climb steps?  no do you drive?  yes Do you have any goals in this area?  yes  Function disabled: date disabled 10/28/2012 I need assistance with the following:  meal prep, household duties and shopping  Neuro/Psych weakness numbness tingling spasms depression anxiety  Prior Studies Any changes since last visit?  yes x-rays nerve study  Physicians involved in your care Primary care . Neurosurgeon .   Family History  Problem Relation Age of Onset  . COPD Mother   . Asthma Mother   . Cancer Father     esophageal cander with mets  . Diabetes Brother   . Stroke Brother    Social History   Social  History  . Marital Status: Married    Spouse Name: N/A  . Number of Children: N/A  . Years of Education: N/A   Social History Main Topics  . Smoking status: Former Smoker    Quit date: 10/28/1996  . Smokeless tobacco: Never Used  . Alcohol Use: No  . Drug Use: No  . Sexual Activity: Not on file   Other Topics Concern  . Not on file   Social History Narrative   Past Surgical History  Procedure Laterality Date  . Abdominal hysterectomy  1989  . Arm surgery      "muscle came off the elbow"  . Cesarean section    . Spine surgery  2008    cervical fusion  . Knee arthroscopy  2004/2008    x2  . Upper gi endoscopy  04-27-13    Dr Tracey HarriesBlazer  . Colonoscopy  2008  . Cholecystectomy     Past Medical History  Diagnosis Date  . GERD (gastroesophageal reflux disease)   . Neuromuscular disorder (HCC) 2009    centralized pain syndrome/fibromylagia  . Osteoporosis   . C. difficile colitis 2008  . Colon polyps 2008  . Hiatal hernia 2014  . Chronic cholecystitis 05/07/2013   There were no vitals taken for this visit.  Opioid Risk Score:   Fall Risk Score:  `1  Depression screen PHQ 2/9  Depression  screen [facility] 2/9 07/18/2015 01/17/2015  Decreased Interest 3 3  Down, Depressed, Hopeless 2 3  PHQ - 2 Score 5 6  Altered sleeping - 3  Tired, decreased energy - 3  Change in appetite - 3  Feeling bad or failure about yourself  - 3  Trouble concentrating - 3  Moving slowly or fidgety/restless - 3  Suicidal thoughts - 0  PHQ-9 Score - 24     Review of Systems  Gastrointestinal: Positive for nausea.  Neurological: Positive for weakness and numbness.       Tingling spasms  Psychiatric/Behavioral: Positive for dysphoric mood. The patient is nervous/anxious.   All other systems reviewed and are negative.      Objective:   Physical Exam  Constitutional: She is oriented to person, place, and time. She appears well-developed. She may have gained a little weight.  Eyes:  Conjunctivae and EOM are normal. Pupils are equal, round, and reactive to light.  Neck: Normal range of motion.  Cardiovascular: Normal rate and regular rhythm.  Pulmonary/Chest: Effort normal.  Abdominal: Soft.  Neurological: She is alert and oriented to person, place, and time. dtr's are 2+ RUE, 2+ bicep,ECR and 1+ tricep LUE. ? sensory changes left hand in c7 dermatome. Strength 5/5 in LUE---left shoulder a little weak but pain component. Mild loss of LT in ulnar half of each hand. Grip is stable.  Musc: pain with palpation of the lower sacrum and near the right PSIS. Patrick's maneuver + on the right. Compression test + on right. . Left pelvis appears improved.  She is limited still with lumbar ROM in all planes.   Psych: pleasant and bright!  Skin: Minimal surgical scars neck    Assessment & Plan:   ASSESSMENT:  1. Centralized pain syndrome/ FMS  2. Lumbar spondylosis. With facet and disc disease. Left L5 radiculopathy (improved)  3. Bilateral greater trochanter bursitis.  4. Osteoarthritis, right knee and left knee. S/p scope right knee 10/26/13  5. Right forearm extensor mechanism injury.  6. 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 Likely left C7 radiculitis  7. Insomnia  8. Depression.  9. Left shoulder pain, likely RTC/labaral injury with bursitis.  10. Right sacral pain. Appears to be SIJ.    PLAN:  1. UE exam stable--has chronic radic.  2. Will order xrays of the pelvis to rule out occult fracture. If negative consider right sided SIJ injection as her symptoms and exam are quite consistent with sacroiliitis  3. Hydrocodone for breakthrough pain. 10/325 with second RF for next month  4. Continue fentanyl patch to for base line pain with second RF for next month  5. Neuro-psych treatment to continue. Maintain celexa at current dosing.  6. Continue trazodone to 100mg . Sleep has improved.  7. Gloves for Raynaud symptoms in hands.  9.  Zanaflex for muscle spasm in the thoracic and lumbar spine.  10. 25 minutes of face to face patient care time were spent during this visit. All questions were encouraged and answered.

## 2015-11-08 NOTE — Addendum Note (Signed)
Addended by: Doreene ElandSHUMAKER, Guinn Delarosa W on: 11/08/2015 01:35 PM   Modules accepted: Orders

## 2015-11-09 ENCOUNTER — Telehealth: Payer: Self-pay | Admitting: Physical Medicine & Rehabilitation

## 2015-11-09 NOTE — Telephone Encounter (Signed)
SI xrays show arthritic changes bilaterally. No fracture. I think it's appropriate to proceed with a right SIJ injection per Dr. Wynn BankerKirsteins as an appt is available if that is OK with Cordelia [name]. I attempted to call her but there was no answer--.  thanks

## 2015-11-14 LAB — TOXASSURE SELECT,+ANTIDEPR,UR: PDF: 0

## 2015-11-14 NOTE — Telephone Encounter (Signed)
I spoke with Kathryn Farrell and she says the numbness in on side of calf and right side of leg laterally.  She asks about if you wanted MRI first

## 2015-11-14 NOTE — Telephone Encounter (Signed)
Left message to call back  

## 2015-11-14 NOTE — Telephone Encounter (Signed)
Not sure what you are describing there.  No numbness was reported at last visit.  Pain was all at [facility].  i do not want an MRI

## 2015-11-17 NOTE — Telephone Encounter (Signed)
Patient would like a call back about the injection that Dr. Riley Kill would like for her to have.

## 2015-11-17 NOTE — Telephone Encounter (Signed)
Can you schedule the SI injection with AK please?

## 2015-11-20 NOTE — Progress Notes (Signed)
Urine drug screen for this encounter is consistent for prescribed medication 

## 2015-11-23 ENCOUNTER — Other Ambulatory Visit: Payer: Self-pay | Admitting: Internal Medicine

## 2015-11-23 DIAGNOSIS — M5116 Intervertebral disc disorders with radiculopathy, lumbar region: Secondary | ICD-10-CM

## 2015-12-05 ENCOUNTER — Ambulatory Visit: Payer: Medicare Other | Admitting: Physical Medicine & Rehabilitation

## 2015-12-08 ENCOUNTER — Ambulatory Visit
Admission: RE | Admit: 2015-12-08 | Discharge: 2015-12-08 | Disposition: A | Discharge status: Home / Self Care | Payer: 59 | Source: Ambulatory Visit | Attending: Internal Medicine | Admitting: Internal Medicine

## 2015-12-08 DIAGNOSIS — M5127 Other intervertebral disc displacement, lumbosacral region: Secondary | ICD-10-CM | POA: Diagnosis not present

## 2015-12-08 DIAGNOSIS — M5116 Intervertebral disc disorders with radiculopathy, lumbar region: Secondary | ICD-10-CM | POA: Diagnosis not present

## 2015-12-18 ENCOUNTER — Ambulatory Visit (HOSPITAL_BASED_OUTPATIENT_CLINIC_OR_DEPARTMENT_OTHER): Payer: 59 | Admitting: Physical Medicine & Rehabilitation

## 2015-12-18 ENCOUNTER — Encounter: Payer: 59 | Attending: Physical Medicine & Rehabilitation

## 2015-12-18 ENCOUNTER — Encounter: Payer: Self-pay | Admitting: Physical Medicine & Rehabilitation

## 2015-12-18 VITALS — BP 116/49 | HR 88

## 2015-12-18 DIAGNOSIS — M533 Sacrococcygeal disorders, not elsewhere classified: Secondary | ICD-10-CM

## 2015-12-18 DIAGNOSIS — F418 Other specified anxiety disorders: Secondary | ICD-10-CM | POA: Insufficient documentation

## 2015-12-18 DIAGNOSIS — F341 Dysthymic disorder: Secondary | ICD-10-CM | POA: Insufficient documentation

## 2015-12-18 DIAGNOSIS — Z5181 Encounter for therapeutic drug level monitoring: Secondary | ICD-10-CM | POA: Diagnosis present

## 2015-12-18 DIAGNOSIS — M961 Postlaminectomy syndrome, not elsewhere classified: Secondary | ICD-10-CM | POA: Diagnosis present

## 2015-12-18 DIAGNOSIS — M609 Myositis, unspecified: Secondary | ICD-10-CM | POA: Insufficient documentation

## 2015-12-18 DIAGNOSIS — M5412 Radiculopathy, cervical region: Secondary | ICD-10-CM | POA: Diagnosis present

## 2015-12-18 DIAGNOSIS — G890 Central pain syndrome: Secondary | ICD-10-CM | POA: Insufficient documentation

## 2015-12-18 DIAGNOSIS — M47817 Spondylosis without myelopathy or radiculopathy, lumbosacral region: Secondary | ICD-10-CM | POA: Insufficient documentation

## 2015-12-18 DIAGNOSIS — M47812 Spondylosis without myelopathy or radiculopathy, cervical region: Secondary | ICD-10-CM | POA: Insufficient documentation

## 2015-12-18 DIAGNOSIS — M791 Myalgia: Secondary | ICD-10-CM | POA: Insufficient documentation

## 2015-12-18 NOTE — Progress Notes (Signed)
  PROCEDURE RECORD [facility]   Name: Kathryn Farrell DOB:01/12/1961 MRN: [id number]  Date:12/18/2015  Physician: Claudette Laws, MD    Nurse/CMA: Shumaker RN  Allergies:  Allergies  Allergen Reactions  . Lodine [Etodolac] Nausea And Vomiting  . Penicillins Other (See Comments)    Was a child; doesn't remember.  . Zanaflex [Tizanidine Hcl]     Anxious, heart flutter  . Effexor [Venlafaxine Hydrochloride]     Makes her jittery  . Gabapentin Palpitations    Chest Pains    Consent Signed: Yes.    Is patient diabetic? No.  CBG today?   Pregnant: No. LMP: No LMP recorded. Patient has had a hysterectomy. (age [age])  Anticoagulants: no Anti-inflammatory: no Antibiotics: no  Procedure: right sacroiliac steroid injection Position: Prone Start Time: 1:45 End Time: 1:47 Fluoro Time:12 sec  RN/CMA  Arts administrator    Time 1:30 1:50    BP 116/49 99/56    Pulse 88 98    Respirations 14 14    O2 Sat 99 99    S/S 6 6    Pain Level 8/10 8/10     D/C home with husband , patient A & O X 3, D/C instructions reviewed, and sits independently.

## 2015-12-18 NOTE — Patient Instructions (Signed)
Sacroiliac injection was performed today. A combination of a naming medicine plus a cortisone medicine was injected. The injection was done under x-ray guidance. This procedure has been performed to help reduce low back and buttocks pain as well as potentially hip pain. The duration of this injection is variable lasting from hours to  Months. It may repeated if needed. 

## 2015-12-18 NOTE — Progress Notes (Signed)

## 2015-12-25 ENCOUNTER — Telehealth: Payer: Self-pay

## 2015-12-25 NOTE — Telephone Encounter (Signed)
The injection provided 90% relief. The evening of day 2, she could tell it was coming back. She states that it is now worse. Pain scale now is 10/10. Hard to stand, squat, bend. Pt states she does not want anymore meds, she just wants it fixed to where she can walk again comfortably. I advised her to stretch and remain active, but she states that it is nearly impossible for her to stretch right now, due to the intensity of the pain. Pt kept stating "I know something is not right". Please advise.

## 2015-12-25 NOTE — Telephone Encounter (Signed)
Agree with Dr. Riley Kill note. The sacroiliac injection can be repeated but may only provide short-term relief

## 2015-12-25 NOTE — Telephone Encounter (Signed)
Pt called about her injection that she received last week. She said that it provided her 2 days of relief and now she is in a lot of pain. She would like to know what the next step is.

## 2015-12-25 NOTE — Telephone Encounter (Signed)
I spoke with Cordelia Pen and gave her Dr Riley Kill message.  I will also forward to Dr Wynn Banker. She is not sure if maybe she should have another injection and it would help.  Please advise.

## 2015-12-25 NOTE — Telephone Encounter (Signed)
Needs to continue to try to stretch and mobilize as much as she can.Marland Kitchen Apply heat to hip/low back prior to stretching. Can use ice afterwards.

## 2015-12-25 NOTE — Telephone Encounter (Signed)
I recommend stretching as we have described before. Maintain activity as possible. Is pain now again the same as it was prior to injection? How much did injection reduce pain (what percentage)?

## 2015-12-26 NOTE — Telephone Encounter (Signed)
I talked with Cordelia Pen and she wants to go ahead with another sacroiliac injection

## 2015-12-28 ENCOUNTER — Encounter: Payer: Self-pay | Admitting: Physical Medicine & Rehabilitation

## 2015-12-28 ENCOUNTER — Ambulatory Visit (HOSPITAL_BASED_OUTPATIENT_CLINIC_OR_DEPARTMENT_OTHER): Payer: 59 | Admitting: Physical Medicine & Rehabilitation

## 2015-12-28 ENCOUNTER — Encounter: Payer: 59 | Attending: Physical Medicine & Rehabilitation

## 2015-12-28 VITALS — BP 109/46 | HR 73 | Resp 14

## 2015-12-28 DIAGNOSIS — F418 Other specified anxiety disorders: Secondary | ICD-10-CM | POA: Insufficient documentation

## 2015-12-28 DIAGNOSIS — M533 Sacrococcygeal disorders, not elsewhere classified: Secondary | ICD-10-CM

## 2015-12-28 DIAGNOSIS — M609 Myositis, unspecified: Secondary | ICD-10-CM | POA: Insufficient documentation

## 2015-12-28 DIAGNOSIS — Z5181 Encounter for therapeutic drug level monitoring: Secondary | ICD-10-CM | POA: Insufficient documentation

## 2015-12-28 DIAGNOSIS — M791 Myalgia: Secondary | ICD-10-CM | POA: Insufficient documentation

## 2015-12-28 DIAGNOSIS — M47812 Spondylosis without myelopathy or radiculopathy, cervical region: Secondary | ICD-10-CM | POA: Insufficient documentation

## 2015-12-28 DIAGNOSIS — M961 Postlaminectomy syndrome, not elsewhere classified: Secondary | ICD-10-CM | POA: Diagnosis present

## 2015-12-28 DIAGNOSIS — F341 Dysthymic disorder: Secondary | ICD-10-CM | POA: Insufficient documentation

## 2015-12-28 DIAGNOSIS — G890 Central pain syndrome: Secondary | ICD-10-CM | POA: Insufficient documentation

## 2015-12-28 DIAGNOSIS — M5412 Radiculopathy, cervical region: Secondary | ICD-10-CM | POA: Insufficient documentation

## 2015-12-28 DIAGNOSIS — M47817 Spondylosis without myelopathy or radiculopathy, lumbosacral region: Secondary | ICD-10-CM | POA: Diagnosis present

## 2015-12-28 NOTE — Progress Notes (Signed)
Right sacroiliac injection under fluoroscopic guidance  Indication: Right Low back and buttocks pain not relieved by medication management and other conservative care.  Informed consent was obtained after describing risks and benefits of the procedure with the patient, this includes bleeding, bruising, infection, paralysis and medication side effects. The patient wishes to proceed and has given written consent. The patient was placed in a prone position. The lumbar and sacral area was marked and prepped with Betadine. A 25-gauge 1-1/2 inch needle was inserted into the skin and subcutaneous tissue and 1 mL of 1% lidocaine was injected. Then a 25-gauge 3 inch spinal needle was inserted under fluoroscopic guidance into the Right sacroiliac joint. AP and lateral images were utilized. Omnipaque 180x0.5 mL under live fluoroscopy demonstrated no intravascular uptake. Then a solution containing one ML of 6 mgper ml betamethasone and 2 ML of .25% bupivacaine MPF was injected x1.5 mL. Patient tolerated the procedure well. Post procedure instructions were given. Please see post procedure form.

## 2015-12-28 NOTE — Patient Instructions (Signed)
Sacroiliac injection was performed today. A combination of a naming medicine plus a cortisone medicine was injected. The injection was done under x-ray guidance. This procedure has been performed to help reduce low back and buttocks pain as well as potentially hip pain. The duration of this injection is variable lasting from hours to  Months. It may repeated if needed. 

## 2015-12-28 NOTE — Progress Notes (Signed)
  PROCEDURE RECORD [facility]   Name: Kathryn Farrell DOB:Mar 03, 1961 MRN: [id number]  Date:12/28/2015  Physician: Claudette Laws, MD    Nurse/CMA: Purvis Sheffield, CMA  Allergies:  Allergies  Allergen Reactions  . Lodine [Etodolac] Nausea And Vomiting  . Penicillins Other (See Comments)    Was a child; doesn't remember.  . Zanaflex [Tizanidine Hcl]     Anxious, heart flutter  . Effexor [Venlafaxine Hydrochloride]     Makes her jittery  . Gabapentin Palpitations    Chest Pains    Consent Signed: Yes.    Is patient diabetic? No.  CBG today? N/a   Pregnant: No. LMP: No LMP recorded. Patient has had a hysterectomy. (age [age])  Anticoagulants: no Anti-inflammatory: no Antibiotics: no  Procedure: right sacroiliac steroid injection Position: Prone Start Time: 10:40am  End Time: 10:43am  Fluoro Time: 8  RN/CMA Delfin Edis    Time 10:15am 10:47am    BP 109/46 111/59    Pulse 73 75    Respirations 14 14    O2 Sat 96 99    S/S 6 6    Pain Level 9/10 8/10     D/C home with husband, patient A & O X 3, D/C instructions reviewed, and sits independently.

## 2016-01-03 ENCOUNTER — Encounter: Payer: Self-pay | Admitting: Physical Medicine & Rehabilitation

## 2016-01-03 ENCOUNTER — Encounter (HOSPITAL_BASED_OUTPATIENT_CLINIC_OR_DEPARTMENT_OTHER): Payer: 59 | Admitting: Physical Medicine & Rehabilitation

## 2016-01-03 VITALS — BP 114/41 | HR 71

## 2016-01-03 DIAGNOSIS — M791 Myalgia: Secondary | ICD-10-CM | POA: Diagnosis not present

## 2016-01-03 DIAGNOSIS — Z5181 Encounter for therapeutic drug level monitoring: Secondary | ICD-10-CM

## 2016-01-03 DIAGNOSIS — M5412 Radiculopathy, cervical region: Secondary | ICD-10-CM

## 2016-01-03 DIAGNOSIS — F341 Dysthymic disorder: Secondary | ICD-10-CM

## 2016-01-03 DIAGNOSIS — F418 Other specified anxiety disorders: Secondary | ICD-10-CM

## 2016-01-03 DIAGNOSIS — M7060 Trochanteric bursitis, unspecified hip: Secondary | ICD-10-CM

## 2016-01-03 DIAGNOSIS — M961 Postlaminectomy syndrome, not elsewhere classified: Secondary | ICD-10-CM | POA: Diagnosis not present

## 2016-01-03 DIAGNOSIS — M47817 Spondylosis without myelopathy or radiculopathy, lumbosacral region: Secondary | ICD-10-CM

## 2016-01-03 DIAGNOSIS — M533 Sacrococcygeal disorders, not elsewhere classified: Secondary | ICD-10-CM

## 2016-01-03 DIAGNOSIS — IMO0001 Reserved for inherently not codable concepts without codable children: Secondary | ICD-10-CM

## 2016-01-03 DIAGNOSIS — M609 Myositis, unspecified: Secondary | ICD-10-CM

## 2016-01-03 DIAGNOSIS — G890 Central pain syndrome: Secondary | ICD-10-CM

## 2016-01-03 DIAGNOSIS — M47812 Spondylosis without myelopathy or radiculopathy, cervical region: Secondary | ICD-10-CM

## 2016-01-03 MED ORDER — FENTANYL 25 MCG/HR TD PT72: 25.0000 ug | MEDICATED_PATCH | TRANSDERMAL | Status: DC

## 2016-01-03 MED ORDER — HYDROCODONE-ACETAMINOPHEN 10-325 MG PO TABS: 1.0000 | ORAL_TABLET | Freq: Four times a day (QID) | ORAL | Status: DC | PRN

## 2016-01-03 NOTE — Patient Instructions (Signed)
?????????   SI Belt?

## 2016-01-03 NOTE — Progress Notes (Signed)
Subjective:    Patient ID: Kathryn Farrell, female    DOB: 11/22/1960, 55 y.o.   MRN: [id number]  HPI   Cordelia [name] is back regarding her chronic pain. The second SIJ injection proved very successful after the first injection only provided temporary relief. She has been more active. She went to the gym with herdaughter. 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 Her neck and shoulders are doing fairly well. Her low back is fairly manageable.  She maintains on hydrocodone and fentanyl as prescribed here.      Pain Inventory Average Pain 6 Pain Right Now 6 My pain is sharp, burning, stabbing, tingling and aching  In the last 24 hours, has pain interfered with the following? General activity 6 Relation with others 6 Enjoyment of life 6 What TIME of day is your pain at its worst? all Sleep (in general) Fair  Pain is worse with: walking, bending, sitting and standing Pain improves with: heat/ice, medication, TENS and injections Relief from Meds: na  Mobility walk without assistance use a cane  Function disabled: date disabled .  Neuro/Psych weakness  Prior Studies Any changes since last visit?  no  Physicians involved in your care Any changes since last visit?  no   Family History  Problem Relation Age of Onset  . COPD Mother   . Asthma Mother   . Cancer Father     esophageal cander with mets  . Diabetes Brother   . Stroke Brother    Social History   Social History  . Marital Status: Married    Spouse Name: N/A  . Number of Children: N/A  . Years of Education: N/A   Social History Main Topics  . Smoking status: Former Smoker    Quit date: 10/28/1996  . Smokeless tobacco: Never Used  . Alcohol Use: No  . Drug Use: No  . Sexual Activity: Not Asked   Other Topics Concern  . None   Social History Narrative   Past Surgical History  Procedure Laterality Date  . Abdominal hysterectomy  1989  . Arm surgery      "muscle  came off the elbow"  . Cesarean section    . Spine surgery  2008    cervical fusion  . Knee arthroscopy  2004/2008    x2  . Upper gi endoscopy  04-27-13    Dr Tracey HarriesBlazer  . Colonoscopy  2008  . Cholecystectomy     Past Medical History  Diagnosis Date  . GERD (gastroesophageal reflux disease)   . Neuromuscular disorder (HCC) 2009    centralized pain syndrome/fibromylagia  . Osteoporosis   . C. difficile colitis 2008  . Colon polyps 2008  . Hiatal hernia 2014  . Chronic cholecystitis 05/07/2013   BP 114/41 mmHg  Pulse 71  SpO2 98%  Opioid Risk Score:   Fall Risk Score:  `1  Depression screen PHQ 2/9  [facility] 01/03/2016 12/18/2015 07/18/2015 01/17/2015  Decreased Interest 3 3 3 3   Down, Depressed, Hopeless 2 2 2 3   PHQ - 2 Score 5 5 5 6   Altered sleeping - - - 3  Tired, decreased energy - - - 3  Change in appetite - - - 3  Feeling bad or failure about yourself  - - - 3  Trouble concentrating - - - 3  Moving slowly or fidgety/restless - - - 3  Suicidal thoughts - - - 0  PHQ-9 Score - - -  24     Review of Systems  All other systems reviewed and are negative.      Objective:   Physical Exam  Constitutional: She is oriented to person, place, and time. She appears well-developed. She may have gained a little weight.  Eyes: Conjunctivae and EOM are normal. Pupils are equal, round, and reactive to light.  Neck: Normal range of motion.  Cardiovascular: Normal rate and regular rhythm.  Pulmonary/Chest: Effort normal.  Abdominal: Soft.  Neurological: She is alert and oriented to person, place, and time. dtr's are 2+ RUE, 2+ bicep,ECR and 1+ tricep LUE. ? sensory changes left hand in c7 dermatome. Strength 5/5 in LUE---left shoulder a little weak but pain component. Mild loss of LT in ulnar half of each hand. Grip is stable.  Musc: minimal pain over right glutes and PSIS. She is limited still with lumbar ROM in all planes.  Psych: pleasant and bright!  Skin:  Minimal surgical scars neck  Assessment & Plan:   ASSESSMENT:  1. Centralized pain syndrome/ FMS  2. Lumbar spondylosis. With facet and disc disease. Left L5 radiculopathy (improved)  3. Bilateral greater trochanter bursitis.  4. Osteoarthritis, right knee and left knee. S/p scope right knee 10/26/13  5. Right forearm extensor mechanism injury.  6. 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 Likely left C7 radiculitis  7. Insomnia  8. Depression.  9. Left shoulder pain, likely RTC/labaral injury with bursitis.  10. Right sacroiliac pain    PLAN:  1. UE exam stable--has chronic radic.  2. Positive results with the second SI injection. Reviewed SI exercises and SI belt moving forward to prevent severe exacerbations. Also needs to be realistic with activity. 3. Hydrocodone for breakthrough pain. 10/325 with second RF for next month  4. Continue fentanyl patch to for base line pain with second RF for next month  5. Neuro-psych treatment to continue. Maintain celexa at current dosing.  6. Continue trazodone to . Sleep has improved.  7. Raynaud's treatment prn.  9. Zanaflex for muscle spasm in the thoracic and lumbar spine.  10. 15 minutes of face to face patient care time were spent during this visit. All questions were encouraged and answered.

## 2016-02-26 ENCOUNTER — Encounter: Payer: Self-pay | Admitting: Physical Medicine & Rehabilitation

## 2016-02-26 ENCOUNTER — Encounter: Payer: 59 | Attending: Physical Medicine & Rehabilitation | Admitting: Physical Medicine & Rehabilitation

## 2016-02-26 DIAGNOSIS — M47812 Spondylosis without myelopathy or radiculopathy, cervical region: Secondary | ICD-10-CM | POA: Insufficient documentation

## 2016-02-26 DIAGNOSIS — M961 Postlaminectomy syndrome, not elsewhere classified: Secondary | ICD-10-CM | POA: Diagnosis present

## 2016-02-26 DIAGNOSIS — G890 Central pain syndrome: Secondary | ICD-10-CM | POA: Insufficient documentation

## 2016-02-26 DIAGNOSIS — M47817 Spondylosis without myelopathy or radiculopathy, lumbosacral region: Secondary | ICD-10-CM | POA: Diagnosis present

## 2016-02-26 DIAGNOSIS — M609 Myositis, unspecified: Secondary | ICD-10-CM | POA: Insufficient documentation

## 2016-02-26 DIAGNOSIS — Z5181 Encounter for therapeutic drug level monitoring: Secondary | ICD-10-CM | POA: Insufficient documentation

## 2016-02-26 DIAGNOSIS — F418 Other specified anxiety disorders: Secondary | ICD-10-CM | POA: Diagnosis present

## 2016-02-26 DIAGNOSIS — M5412 Radiculopathy, cervical region: Secondary | ICD-10-CM | POA: Diagnosis present

## 2016-02-26 DIAGNOSIS — Z79899 Other long term (current) drug therapy: Secondary | ICD-10-CM

## 2016-02-26 DIAGNOSIS — M791 Myalgia: Secondary | ICD-10-CM | POA: Insufficient documentation

## 2016-02-26 DIAGNOSIS — F341 Dysthymic disorder: Secondary | ICD-10-CM | POA: Diagnosis present

## 2016-02-26 DIAGNOSIS — IMO0001 Reserved for inherently not codable concepts without codable children: Secondary | ICD-10-CM

## 2016-02-26 MED ORDER — HYDROCODONE-ACETAMINOPHEN 10-325 MG PO TABS: 1.0000 | ORAL_TABLET | Freq: Four times a day (QID) | ORAL | Status: DC | PRN

## 2016-02-26 MED ORDER — FENTANYL 25 MCG/HR TD PT72: 25.0000 ug | MEDICATED_PATCH | TRANSDERMAL | Status: DC

## 2016-02-26 NOTE — Progress Notes (Signed)
Subjective:    Patient ID: Kathryn Farrell, female    DOB: 12/18/1960, 55 y.o.   MRN: [id number]  HPI   Kathryn Farrell is here in regard to her chronic pain. She had excellent results from the right SIJ injection but the effects lasted only 1.5 months. The pain has slowly returned. She admits to not being as regular with her stretching and postural exercises as she should. She does utilize her SI belt and tries to stay generally active. She likes to walk. She is still baking cakes.   Kathryn Farrell uses hydrocodone and fentanyl for her pain as prescribed.   Pain Inventory Average Pain 7 Pain Right Now 7 My pain is sharp, burning, stabbing, tingling and aching  In the last 24 hours, has pain interfered with the following? General activity 8 Relation with others 7 Enjoyment of life 7 What TIME of day is your pain at its worst? morning, daytime and evening Sleep (in general) Fair  Pain is worse with: walking, bending, sitting, standing and some activites Pain improves with: rest, heat/ice, medication, TENS and injections Relief from Meds: no answer  Mobility use a cane how many minutes can you walk? not long ability to climb steps?  no do you drive?  yes Do you have any goals in this area?  yes  Function disabled: date disabled 2014 I need assistance with the following:  meal prep, household duties and shopping  Neuro/Psych weakness numbness tingling spasms depression anxiety  Prior Studies Any changes since last visit?  no  Physicians involved in your care Any changes since last visit?  yes   Family History  Problem Relation Age of Onset  . COPD Mother   . Asthma Mother   . Cancer Father     esophageal cander with mets  . Diabetes Brother   . Stroke Brother    Social History   Social History  . Marital Status: Married    Spouse Name: N/A  . Number of Children: N/A  . Years of Education: N/A   Social History Main Topics  . Smoking status: Former Smoker    Quit  date: 10/28/1996  . Smokeless tobacco: Never Used  . Alcohol Use: No  . Drug Use: No  . Sexual Activity: Not Asked   Other Topics Concern  . None   Social History Narrative   Past Surgical History  Procedure Laterality Date  . Abdominal hysterectomy  1989  . Arm surgery      "muscle came off the elbow"  . Cesarean section    . Spine surgery  2008    cervical fusion  . Knee arthroscopy  2004/2008    x2  . Upper gi endoscopy  04-27-13    Dr Tracey HarriesBlazer  . Colonoscopy  2008  . Cholecystectomy     Past Medical History  Diagnosis Date  . GERD (gastroesophageal reflux disease)   . Neuromuscular disorder (HCC) 2009    centralized pain syndrome/fibromylagia  . Osteoporosis   . C. difficile colitis 2008  . Colon polyps 2008  . Hiatal hernia 2014  . Chronic cholecystitis 05/07/2013   There were no vitals taken for this visit.  Opioid Risk Score:   Fall Risk Score:  `1  Depression screen PHQ 2/9  [facility] 01/03/2016 12/18/2015 07/18/2015 01/17/2015  Decreased Interest 3 3 3 3   Down, Depressed, Hopeless 2 2 2 3   PHQ - 2 Score 5 5 5 6   Altered sleeping - - - 3  Tired, decreased energy - - - 3  Change in appetite - - - 3  Feeling bad or failure about yourself  - - - 3  Trouble concentrating - - - 3  Moving slowly or fidgety/restless - - - 3  Suicidal thoughts - - - 0  PHQ-9 Score - - - 24     Review of Systems     Objective:   Physical Exam Constitutional: She is oriented to person, place, and time. She appears well-developed. She may have gained a little weight.  Eyes: Conjunctivae and EOM are normal. Pupils are equal, round, and reactive to light.  Neck: Normal range of motion.  Cardiovascular: Normal rate and regular rhythm.  Pulmonary/Chest: Effort normal.  Abdominal: Soft.  Neurological: She is alert and oriented to person, place, and time. dtr's are 2+ RUE, 2+ bicep,ECR and 1+ tricep LUE. ? sensory changes left hand in c7 dermatome. Strength 5/5 in  LUE---left shoulder a little weak but pain component. Mild loss of LT in ulnar half of each hand. Grip is stable.  Musc: recurrent pain over right glutes and PSIS. Tends to lean forward with pelvis rotated forward also. She had pain with extension of the low back pain.  Psych: pleasant and bright!  Skin: Minimal surgical scars neck    Assessment & Plan:   ASSESSMENT:  1. Centralized pain syndrome/ FMS  2. Lumbar spondylosis. With facet and disc disease. Left L5 radiculopathy (improved)  3. Bilateral greater trochanter bursitis.  4. Osteoarthritis, right knee and left knee. S/p scope right knee 10/26/13  5. Right forearm extensor mechanism injury.  6. 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 Likely left C7 radiculitis  7. Insomnia  8. Depression.  9. Left shoulder pain, likely RTC/labaral injury with bursitis.  10. Right sacroiliac pain responsive to injection for 1.5 months.     PLAN:  1. UE exam stable--has chronic radic.   2. Recent SIJ injections with benefit but somewhat transient. Could consider RF's later this year. Want her being more diligent with her HEP. She will also see ortho regarding her inguinal hip pain. 3. Hydrocodone for breakthrough pain. 10/325 with second RF for next month  4. Continue fentanyl patch to for base line pain with second RF for next month  5. Neuro-psych treatment to continue. Maintain celexa at current dosing.  6. Continue trazodone to . Sleep has improved.  7. Raynaud's treatment prn.  9. Zanaflex for muscle spasm in the thoracic and lumbar spine.  10. 15 minutes of face to face patient care time were spent during this visit. All questions were encouraged and answered.

## 2016-02-26 NOTE — Patient Instructions (Signed)
  PLEASE CALL ME WITH ANY PROBLEMS OR QUESTIONS (#[phone]).      

## 2016-03-02 LAB — TOXASSURE SELECT,+ANTIDEPR,UR

## 2016-03-04 ENCOUNTER — Ambulatory Visit: Payer: 59 | Admitting: Physical Medicine & Rehabilitation

## 2016-03-05 NOTE — Progress Notes (Signed)
Urine drug screen for this encounter is consistent for prescribed medication 

## 2016-04-24 ENCOUNTER — Encounter: Payer: 59 | Attending: Physical Medicine & Rehabilitation | Admitting: Registered Nurse

## 2016-04-24 ENCOUNTER — Encounter: Payer: Self-pay | Admitting: Registered Nurse

## 2016-04-24 VITALS — BP 105/67 | HR 68 | Resp 14

## 2016-04-24 DIAGNOSIS — F341 Dysthymic disorder: Secondary | ICD-10-CM | POA: Diagnosis present

## 2016-04-24 DIAGNOSIS — Z79899 Other long term (current) drug therapy: Secondary | ICD-10-CM

## 2016-04-24 DIAGNOSIS — M791 Myalgia: Secondary | ICD-10-CM | POA: Diagnosis present

## 2016-04-24 DIAGNOSIS — F418 Other specified anxiety disorders: Secondary | ICD-10-CM | POA: Insufficient documentation

## 2016-04-24 DIAGNOSIS — G890 Central pain syndrome: Secondary | ICD-10-CM | POA: Diagnosis present

## 2016-04-24 DIAGNOSIS — M961 Postlaminectomy syndrome, not elsewhere classified: Secondary | ICD-10-CM | POA: Diagnosis present

## 2016-04-24 DIAGNOSIS — M47817 Spondylosis without myelopathy or radiculopathy, lumbosacral region: Secondary | ICD-10-CM | POA: Diagnosis present

## 2016-04-24 DIAGNOSIS — M7061 Trochanteric bursitis, right hip: Secondary | ICD-10-CM

## 2016-04-24 DIAGNOSIS — M7062 Trochanteric bursitis, left hip: Secondary | ICD-10-CM

## 2016-04-24 DIAGNOSIS — Z5181 Encounter for therapeutic drug level monitoring: Secondary | ICD-10-CM | POA: Diagnosis present

## 2016-04-24 DIAGNOSIS — M609 Myositis, unspecified: Secondary | ICD-10-CM

## 2016-04-24 DIAGNOSIS — M5412 Radiculopathy, cervical region: Secondary | ICD-10-CM | POA: Insufficient documentation

## 2016-04-24 DIAGNOSIS — IMO0001 Reserved for inherently not codable concepts without codable children: Secondary | ICD-10-CM

## 2016-04-24 DIAGNOSIS — M47812 Spondylosis without myelopathy or radiculopathy, cervical region: Secondary | ICD-10-CM | POA: Diagnosis present

## 2016-04-24 MED ORDER — FENTANYL 25 MCG/HR TD PT72: 25.0000 ug | MEDICATED_PATCH | TRANSDERMAL | Status: DC

## 2016-04-24 MED ORDER — HYDROCODONE-ACETAMINOPHEN 10-325 MG PO TABS: 1.0000 | ORAL_TABLET | Freq: Four times a day (QID) | ORAL | Status: DC | PRN
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
## 2016-06-24 ENCOUNTER — Encounter: Payer: 59 | Attending: Physical Medicine & Rehabilitation | Admitting: Physical Medicine & Rehabilitation

## 2016-06-24 ENCOUNTER — Encounter: Payer: Self-pay | Admitting: Physical Medicine & Rehabilitation

## 2016-06-24 VITALS — BP 110/76 | HR 80 | Resp 14

## 2016-06-24 DIAGNOSIS — F418 Other specified anxiety disorders: Secondary | ICD-10-CM

## 2016-06-24 DIAGNOSIS — G890 Central pain syndrome: Secondary | ICD-10-CM

## 2016-06-24 DIAGNOSIS — M5412 Radiculopathy, cervical region: Secondary | ICD-10-CM | POA: Diagnosis present

## 2016-06-24 DIAGNOSIS — M791 Myalgia: Secondary | ICD-10-CM | POA: Insufficient documentation

## 2016-06-24 DIAGNOSIS — M47817 Spondylosis without myelopathy or radiculopathy, lumbosacral region: Secondary | ICD-10-CM | POA: Insufficient documentation

## 2016-06-24 DIAGNOSIS — IMO0001 Reserved for inherently not codable concepts without codable children: Secondary | ICD-10-CM

## 2016-06-24 DIAGNOSIS — Z5181 Encounter for therapeutic drug level monitoring: Secondary | ICD-10-CM | POA: Insufficient documentation

## 2016-06-24 DIAGNOSIS — M47812 Spondylosis without myelopathy or radiculopathy, cervical region: Secondary | ICD-10-CM

## 2016-06-24 DIAGNOSIS — M609 Myositis, unspecified: Secondary | ICD-10-CM | POA: Diagnosis present

## 2016-06-24 DIAGNOSIS — F341 Dysthymic disorder: Secondary | ICD-10-CM | POA: Diagnosis present

## 2016-06-24 DIAGNOSIS — M961 Postlaminectomy syndrome, not elsewhere classified: Secondary | ICD-10-CM | POA: Insufficient documentation

## 2016-06-24 MED ORDER — HYDROCODONE-ACETAMINOPHEN 10-325 MG PO TABS: 1.0000 | ORAL_TABLET | Freq: Four times a day (QID) | ORAL | 0 refills | Status: DC | PRN

## 2016-06-24 NOTE — Progress Notes (Signed)
Subjective:    Patient ID: Kathryn Farrell, female    DOB: 10/01/1961, 55 y.o.   MRN: [id number]  HPI   Kathryn [name] is here in follow up of her chronic pain. She has had ongoing neck pain and is worried that something is "wrong" again with it. She has noticed some sensory loss in her hands again. She is working on leg/core strengthening exercises as her left hip now may need to be replaced. She would like to put it off as long as possible.   Has weaned herself off of fentanyl as she wanted to see how it felt without medication.   Her mood is better. She seems to be coping with her issues more effectively.   Pain Inventory Average Pain 7 Pain Right Now 7 My pain is sharp, burning, stabbing, tingling and aching  In the last 24 hours, has pain interfered with the following? General activity 6 Relation with others 6 Enjoyment of life 6 What TIME of day is your pain at its worst? all Sleep (in general) NA  Pain is worse with: walking, bending and standing Pain improves with: rest, heat/ice, medication, TENS and injections Relief from Meds: na  Mobility Do you have any goals in this area?  yes  Function disabled: date disabled .  Neuro/Psych numbness tingling spasms  Prior Studies Any changes since last visit?  no  Physicians involved in your care Any changes since last visit?  no   Family History  Problem Relation Age of Onset  . COPD Mother   . Asthma Mother   . Cancer Father     esophageal cander with mets  . Diabetes Brother   . Stroke Brother    Social History   Social History  . Marital status: Married    Spouse name: N/A  . Number of children: N/A  . Years of education: N/A   Social History Main Topics  . Smoking status: Former Smoker    Quit date: 10/28/1996  . Smokeless tobacco: Never Used  . Alcohol use No  . Drug use: No  . Sexual activity: Not Asked   Other Topics Concern  . None   Social History Narrative  . None   Past Surgical History:    Procedure Laterality Date  . ABDOMINAL HYSTERECTOMY  1989  . arm surgery     "muscle came off the elbow"  . CESAREAN SECTION    . CHOLECYSTECTOMY    . COLONOSCOPY  2008  . KNEE ARTHROSCOPY  2004/2008   x2  . SPINE SURGERY  2008   cervical fusion  . UPPER GI ENDOSCOPY  04-27-13   Dr Tracey HarriesBlazer   Past Medical History:  Diagnosis Date  . C. difficile colitis 2008  . Chronic cholecystitis 05/07/2013  . Colon polyps 2008  . GERD (gastroesophageal reflux disease)   . Hiatal hernia 2014  . Neuromuscular disorder (HCC) 2009   centralized pain syndrome/fibromylagia  . Osteoporosis    BP 110/76 (BP Location: Left Arm, Patient Position: Sitting, Cuff Size: Normal)   Pulse 80   Resp 14   SpO2 96%   Opioid Risk Score:   Fall Risk Score:  `1  Depression screen PHQ 2/9  [facility] 06/24/2016 04/24/2016 01/03/2016 12/18/2015 07/18/2015 01/17/2015  Decreased Interest 1 0 3 3 3 3   Down, Depressed, Hopeless 1 0 2 2 2 3   PHQ - 2 Score 2 0 5 5 5 6   Altered sleeping - - - - - 3  Tired, decreased energy - - - - - 3  Change in appetite - - - - - 3  Feeling bad or failure about yourself  - - - - - 3  Trouble concentrating - - - - - 3  Moving slowly or fidgety/restless - - - - - 3  Suicidal thoughts - - - - - 0  PHQ-9 Score - - - - - 24    Review of Systems  All other systems reviewed and are negative.      Objective:   Physical Exam Constitutional: She is oriented to person, place, and time. She appears well-developed. She may have gained a little weight.  Eyes: Conjunctivae and EOM are normal. Pupils are equal, round, and reactive to light.  Neck: Normal range of motion.  Cardiovascular: Normal rate and regular rhythm.  Pulmonary/Chest: Effort normal.  Abdominal: Soft.  Neurological: She is alert and oriented to person, place, and time. dtr's are 2+ RUE, 2+ bicep,ECR and 1+ tricep LUE. ? sensory changes left hand in c7 dermatome. Strength 5/5 in LUE---left shoulder a little  weak but pain component. Mild loss of LT in ulnar half of each hand. Grip is stable.  Musc: recurrent pain over right glutes and PSIS. Tends to lean forward with pelvis rotated forward also. She had pain with extension of the low back pain.  Psych: pleasant and bright!  Skin: Minimal surgical scars neck    Assessment & Plan:   ASSESSMENT:  1. Centralized pain syndrome/ FMS  2. Lumbar spondylosis. With facet and disc disease. Left L5 radiculopathy (improved)  3. Bilateral greater trochanter bursitis.  4. Osteoarthritis, right knee and left knee. S/p scope right knee 10/26/13  5. Right forearm extensor mechanism injury.  6. 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 Likely left C7 radiculitis  7. Insomnia  8. Depression.  9. Left shoulder pain, likely RTC/labaral injury with bursitis.  10. Right sacroiliac pain responsive to injection for in past.     PLAN:   1. UE exam stable--has chronic radic but may have some increasing sensory symptoms. .   2. Hip mgt per ortho. ?THA   3. Hydrocodone for breakthrough pain. 10/325 with second RF for next month  4. She will keep her fentanyl patch  for now at home (won't use) . I wont' refill it as a "PRN" med however. She sees that more medication isn't necessary better pain control. 5.  Maintain celexa at current dosing.  6. Continue trazodone to 100mg . Sleep has improved.  7. Raynaud's treatment prn.  9. Zanaflex for muscle spasm in the thoracic and lumbar spine.  10. 15 minutes of face to face patient care time were spent during this visit. All questions were encouraged and answered.

## 2016-06-24 NOTE — Patient Instructions (Signed)
PLEASE CALL ME WITH ANY PROBLEMS OR QUESTIONS ([phone])  

## 2016-07-24 ENCOUNTER — Telehealth: Payer: Self-pay | Admitting: Physical Medicine & Rehabilitation

## 2016-07-24 NOTE — Telephone Encounter (Signed)
Called pharmacy and confirmed prescription

## 2016-07-24 NOTE — Telephone Encounter (Signed)
[facility] needs to verify prescription Hydrocodone for patient.  Please call them at [phone].

## 2016-08-14 ENCOUNTER — Encounter: Payer: Self-pay | Admitting: Physical Medicine & Rehabilitation

## 2016-08-14 ENCOUNTER — Encounter: Payer: 59 | Attending: Physical Medicine & Rehabilitation | Admitting: Physical Medicine & Rehabilitation

## 2016-08-14 VITALS — BP 89/58 | HR 81 | Resp 14

## 2016-08-14 DIAGNOSIS — F341 Dysthymic disorder: Secondary | ICD-10-CM | POA: Insufficient documentation

## 2016-08-14 DIAGNOSIS — G890 Central pain syndrome: Secondary | ICD-10-CM | POA: Insufficient documentation

## 2016-08-14 DIAGNOSIS — M961 Postlaminectomy syndrome, not elsewhere classified: Secondary | ICD-10-CM | POA: Diagnosis present

## 2016-08-14 DIAGNOSIS — Z79899 Other long term (current) drug therapy: Secondary | ICD-10-CM

## 2016-08-14 DIAGNOSIS — M47812 Spondylosis without myelopathy or radiculopathy, cervical region: Secondary | ICD-10-CM | POA: Insufficient documentation

## 2016-08-14 DIAGNOSIS — M609 Myositis, unspecified: Secondary | ICD-10-CM | POA: Diagnosis present

## 2016-08-14 DIAGNOSIS — F418 Other specified anxiety disorders: Secondary | ICD-10-CM | POA: Insufficient documentation

## 2016-08-14 DIAGNOSIS — M791 Myalgia: Secondary | ICD-10-CM | POA: Insufficient documentation

## 2016-08-14 DIAGNOSIS — M47816 Spondylosis without myelopathy or radiculopathy, lumbar region: Secondary | ICD-10-CM

## 2016-08-14 DIAGNOSIS — M47817 Spondylosis without myelopathy or radiculopathy, lumbosacral region: Secondary | ICD-10-CM | POA: Insufficient documentation

## 2016-08-14 DIAGNOSIS — M5412 Radiculopathy, cervical region: Secondary | ICD-10-CM | POA: Diagnosis present

## 2016-08-14 DIAGNOSIS — Z5181 Encounter for therapeutic drug level monitoring: Secondary | ICD-10-CM | POA: Diagnosis present

## 2016-08-14 MED ORDER — HYDROCODONE-ACETAMINOPHEN 10-325 MG PO TABS: 1.0000 | ORAL_TABLET | Freq: Four times a day (QID) | ORAL | 0 refills | Status: DC | PRN

## 2016-08-14 MED ORDER — CARBAMAZEPINE 200 MG PO TABS: 200.0000 mg | ORAL_TABLET | Freq: Two times a day (BID) | ORAL | 5 refills | Status: DC

## 2016-08-14 NOTE — Progress Notes (Signed)
Subjective:    Patient ID: Kathryn Farrell, female    DOB: 01/10/1961, 54 y.o.   MRN: [id number]  HPI   Kathryn Farrell is here in follow up of her chronic pain. She still has pain in her neck which has been unrelenting. She states that her left arm is numb and tingling into the last 3 fingers of her left hand. She is also having ongoing severe cervical ROM and headaches. I reviewed a cervical XR, CT and MRI---none show any evidence of nerve injury or compromise. She dose have mild facet disease and spondylosis above and below the fusion level.   Pain Inventory Average Pain 9 Pain Right Now 9 My pain is sharp, stabbing and tingling  In the last 24 hours, has pain interfered with the following? General activity 9 Relation with others 9 Enjoyment of life 9 What TIME of day is your pain at its worst? All Sleep (in general) Poor  Pain is worse with: walking and bending Pain improves with: heat/ice and medication Relief from Meds: 5  Mobility use a cane how many minutes can you walk? Not long due to pain ability to climb steps?  no do you drive?  yes Do you have any goals in this area?  yes  Function disabled: date disabled 2014 I need assistance with the following:  meal prep, household duties and shopping  Neuro/Psych weakness numbness tingling spasms  Prior Studies Any changes since last visit?  no  Physicians involved in your care Any changes since last visit?  no   Family History  Problem Relation Age of Onset  . COPD Mother   . Asthma Mother   . Cancer Father     esophageal cander with mets  . Diabetes Brother   . Stroke Brother    Social History   Social History  . Marital status: Married    Spouse name: N/A  . Number of children: N/A  . Years of education: N/A   Social History Main Topics  . Smoking status: Former Smoker    Quit date: 10/28/1996  . Smokeless tobacco: Never Used  . Alcohol use No  . Drug use: No  . Sexual activity: Not Asked   Other  Topics Concern  . None   Social History Narrative  . None   Past Surgical History:  Procedure Laterality Date  . ABDOMINAL HYSTERECTOMY  1989  . arm surgery     "muscle came off the elbow"  . CESAREAN SECTION    . CHOLECYSTECTOMY    . COLONOSCOPY  2008  . KNEE ARTHROSCOPY  2004/2008   x2  . SPINE SURGERY  2008   cervical fusion  . UPPER GI ENDOSCOPY  04-27-13   Dr [name]   Past Medical History:  Diagnosis Date  . C. difficile colitis 2008  . Chronic cholecystitis 05/07/2013  . Colon polyps 2008  . GERD (gastroesophageal reflux disease)   . Hiatal hernia 2014  . Neuromuscular disorder (HCC) 2009   centralized pain syndrome/fibromylagia  . Osteoporosis    BP (!) 89/58   Pulse 81   Resp 14   SpO2 94%   Opioid Risk Score:   Fall Risk Score:  `1  Depression screen PHQ 2/9  Depression screen [facility] 2/9 06/24/2016 04/24/2016 01/03/2016 12/18/2015 07/18/2015 01/17/2015  Decreased Interest 1 0 3 3 3 3   Down, Depressed, Hopeless 1 0 2 2 2 3   PHQ - 2 Score 2 0 5 5 5 6   Altered sleeping - - - - -  3  Tired, decreased energy - - - - - 3  Change in appetite - - - - - 3  Feeling bad or failure about yourself  - - - - - 3  Trouble concentrating - - - - - 3  Moving slowly or fidgety/restless - - - - - 3  Suicidal thoughts - - - - - 0  PHQ-9 Score - - - - - 24    Review of Systems  Constitutional:       Easy bleeding  Gastrointestinal: Positive for nausea.  All other systems reviewed and are negative.      Objective:   Physical Exam  Constitutional: She is oriented to person, place, and time. She appears well-developed. She may have gained a little weight.  Eyes: Conjunctivae and EOM are normal. Pupils are equal, round, and reactive to light.  Neck: Normal range of motion.  Cardiovascular: Normal rate and regular rhythm.  Pulmonary/Chest: Effort normal.  Abdominal: Soft.  Neurological: She is alert and oriented to person, place, and time. dtr's are 2+ RUE, 2+ bicep,ECR and 2+  tricep LUE---equal on both sides. ? sensory changes left hand in c7 dermatome. Strength 5/5 in LUE---left shoulder a little weak but pain component.  Grip is stable.  Musc:  pain over right glutes and PSIS. Tends to lean forward with pelvis rotated forward also. She had pain with extension of the low back pain.  Psych: pleasant and bright!  Skin: Minimal surgical scars neck    Assessment & Plan:  ASSESSMENT:  1. Centralized pain syndrome/ FMS  2. Lumbar spondylosis. With facet and disc disease. Left L5 radiculopathy (improved)  3. Bilateral greater trochanter bursitis.  4. Osteoarthritis, right knee and left knee. S/p scope right knee 10/26/13  5. Right forearm extensor mechanism injury.  6. 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 ? left C7 radiculitis ---scar tissue--no radiographic evidence of compromise---motor/DTR's stable on exam also 7. Insomnia  8. Depression.  9. Left shoulder pain, likely RTC/labaral injury with bursitis.  10. Right sacroiliac pain responsive to injection for in past.    PLAN:   1. UE exam stable--has chronic radic but may have some increasing sensory symptoms.   -trial of tegretol  -needs to work more agressively with therapy on ROM/strengthening/lengthening  -central pain component 2. Hip mgt per ortho. ?THA   3. Hydrocodone for breakthrough pain. 10/325 with second RF for next month  4. Did not restart fentanyl 5.  Maintain celexa at current dosing.  6. Continue trazodone to 100mg . Sleep has improved.  7. Raynaud's treatment prn.  9. Zanaflex for muscle spasm in the thoracic and lumbar spine.  10. 25 minutes of face to face patient care time were spent during this visit. All questions were encouraged and answered.

## 2016-08-14 NOTE — Patient Instructions (Signed)
ASK PT ABOUT ADDRESSING:  CERVICAL RANGE OF MOTION, LENGTHENING, MASSAGE OF YOUR CERVICAL MUSCULATURE AND SHOULDER GIRDLE. CONSIDER OTHER MODALITIES ALSO INCLUDING A CERVICAL TRACTION COLLAR.

## 2016-08-14 NOTE — Addendum Note (Signed)
Addended by: Osa CraverGUIJOZA, Davene Jobin M on: 08/14/2016 03:41 PM   Modules accepted: Orders

## 2016-08-21 LAB — TOXASSURE SELECT,+ANTIDEPR,UR

## 2016-08-21 NOTE — Progress Notes (Signed)
Urine drug screen for this encounter is consistent for prescribed medication 

## 2016-08-28 ENCOUNTER — Telehealth: Payer: Self-pay | Admitting: *Deleted

## 2016-08-28 MED ORDER — OXCARBAZEPINE 150 MG PO TABS: 150.0000 mg | ORAL_TABLET | Freq: Two times a day (BID) | ORAL | 1 refills | Status: DC

## 2016-08-28 NOTE — Telephone Encounter (Signed)
Kathryn Farrell has had pretty significant reaction to her new medication (tegretol), hives itching and mouth tingling and lips numb.She went to Dr Hyacinth MeekerMiller and he gave her a shot of prednisone.  The bad thing is she said it was helping her neck.  Please advise.

## 2016-08-28 NOTE — Telephone Encounter (Signed)
Shunna notified. New med sent to pharmacy. I told her to discuss with pharmacist when she goes to pick up the new medication.

## 2016-08-28 NOTE — Telephone Encounter (Signed)
Stop tegretol. Trial of Trileptal 150mg  BID #60 1RF

## 2016-09-11 ENCOUNTER — Encounter: Payer: 59 | Attending: Physical Medicine & Rehabilitation | Admitting: Registered Nurse

## 2016-09-11 ENCOUNTER — Encounter: Payer: Self-pay | Admitting: Registered Nurse

## 2016-09-11 VITALS — BP 102/66 | HR 70 | Resp 14

## 2016-09-11 DIAGNOSIS — M62838 Other muscle spasm: Secondary | ICD-10-CM

## 2016-09-11 DIAGNOSIS — Z5181 Encounter for therapeutic drug level monitoring: Secondary | ICD-10-CM | POA: Diagnosis present

## 2016-09-11 DIAGNOSIS — F418 Other specified anxiety disorders: Secondary | ICD-10-CM | POA: Insufficient documentation

## 2016-09-11 DIAGNOSIS — M791 Myalgia: Secondary | ICD-10-CM | POA: Diagnosis present

## 2016-09-11 DIAGNOSIS — M7062 Trochanteric bursitis, left hip: Secondary | ICD-10-CM

## 2016-09-11 DIAGNOSIS — M47812 Spondylosis without myelopathy or radiculopathy, cervical region: Secondary | ICD-10-CM | POA: Insufficient documentation

## 2016-09-11 DIAGNOSIS — G890 Central pain syndrome: Secondary | ICD-10-CM | POA: Insufficient documentation

## 2016-09-11 DIAGNOSIS — M961 Postlaminectomy syndrome, not elsewhere classified: Secondary | ICD-10-CM | POA: Diagnosis not present

## 2016-09-11 DIAGNOSIS — M7061 Trochanteric bursitis, right hip: Secondary | ICD-10-CM

## 2016-09-11 DIAGNOSIS — Z79899 Other long term (current) drug therapy: Secondary | ICD-10-CM

## 2016-09-11 DIAGNOSIS — M47817 Spondylosis without myelopathy or radiculopathy, lumbosacral region: Secondary | ICD-10-CM | POA: Insufficient documentation

## 2016-09-11 DIAGNOSIS — M609 Myositis, unspecified: Secondary | ICD-10-CM | POA: Diagnosis present

## 2016-09-11 DIAGNOSIS — F341 Dysthymic disorder: Secondary | ICD-10-CM | POA: Diagnosis present

## 2016-09-11 DIAGNOSIS — M5412 Radiculopathy, cervical region: Secondary | ICD-10-CM | POA: Insufficient documentation

## 2016-09-11 MED ORDER — HYDROCODONE-ACETAMINOPHEN 10-325 MG PO TABS
1.0000 | ORAL_TABLET | Freq: Four times a day (QID) | ORAL | 0 refills | Status: DC | PRN
Start: 2016-09-11 — End: 2016-09-11

## 2016-09-11 MED ORDER — HYDROCODONE-ACETAMINOPHEN 10-325 MG PO TABS: 1.0000 | ORAL_TABLET | Freq: Four times a day (QID) | ORAL | 0 refills | Status: DC | PRN
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
## 2016-11-13 ENCOUNTER — Encounter: Payer: 59 | Attending: Physical Medicine & Rehabilitation | Admitting: Physical Medicine & Rehabilitation

## 2016-11-13 DIAGNOSIS — M5412 Radiculopathy, cervical region: Secondary | ICD-10-CM | POA: Insufficient documentation

## 2016-11-13 DIAGNOSIS — F341 Dysthymic disorder: Secondary | ICD-10-CM | POA: Insufficient documentation

## 2016-11-13 DIAGNOSIS — M791 Myalgia: Secondary | ICD-10-CM | POA: Insufficient documentation

## 2016-11-13 DIAGNOSIS — M47812 Spondylosis without myelopathy or radiculopathy, cervical region: Secondary | ICD-10-CM | POA: Insufficient documentation

## 2016-11-13 DIAGNOSIS — M609 Myositis, unspecified: Secondary | ICD-10-CM | POA: Insufficient documentation

## 2016-11-13 DIAGNOSIS — Z5181 Encounter for therapeutic drug level monitoring: Secondary | ICD-10-CM | POA: Insufficient documentation

## 2016-11-13 DIAGNOSIS — G890 Central pain syndrome: Secondary | ICD-10-CM | POA: Insufficient documentation

## 2016-11-13 DIAGNOSIS — F418 Other specified anxiety disorders: Secondary | ICD-10-CM | POA: Insufficient documentation

## 2016-11-13 DIAGNOSIS — M961 Postlaminectomy syndrome, not elsewhere classified: Secondary | ICD-10-CM | POA: Insufficient documentation

## 2016-11-13 DIAGNOSIS — M47817 Spondylosis without myelopathy or radiculopathy, lumbosacral region: Secondary | ICD-10-CM | POA: Insufficient documentation

## 2016-11-18 ENCOUNTER — Encounter: Payer: Self-pay | Admitting: Registered Nurse

## 2016-11-18 ENCOUNTER — Encounter (HOSPITAL_BASED_OUTPATIENT_CLINIC_OR_DEPARTMENT_OTHER): Payer: 59 | Admitting: Registered Nurse

## 2016-11-18 VITALS — BP 104/69 | HR 82 | Resp 14

## 2016-11-18 DIAGNOSIS — Z5181 Encounter for therapeutic drug level monitoring: Secondary | ICD-10-CM

## 2016-11-18 DIAGNOSIS — F418 Other specified anxiety disorders: Secondary | ICD-10-CM

## 2016-11-18 DIAGNOSIS — M7061 Trochanteric bursitis, right hip: Secondary | ICD-10-CM | POA: Diagnosis not present

## 2016-11-18 DIAGNOSIS — M47816 Spondylosis without myelopathy or radiculopathy, lumbar region: Secondary | ICD-10-CM

## 2016-11-18 DIAGNOSIS — M961 Postlaminectomy syndrome, not elsewhere classified: Secondary | ICD-10-CM | POA: Diagnosis not present

## 2016-11-18 DIAGNOSIS — M62838 Other muscle spasm: Secondary | ICD-10-CM

## 2016-11-18 DIAGNOSIS — M5412 Radiculopathy, cervical region: Secondary | ICD-10-CM | POA: Diagnosis not present

## 2016-11-18 DIAGNOSIS — F341 Dysthymic disorder: Secondary | ICD-10-CM | POA: Diagnosis present

## 2016-11-18 DIAGNOSIS — Z79899 Other long term (current) drug therapy: Secondary | ICD-10-CM

## 2016-11-18 DIAGNOSIS — M47817 Spondylosis without myelopathy or radiculopathy, lumbosacral region: Secondary | ICD-10-CM | POA: Diagnosis present

## 2016-11-18 DIAGNOSIS — M609 Myositis, unspecified: Secondary | ICD-10-CM | POA: Diagnosis not present

## 2016-11-18 DIAGNOSIS — M7062 Trochanteric bursitis, left hip: Secondary | ICD-10-CM

## 2016-11-18 DIAGNOSIS — G890 Central pain syndrome: Secondary | ICD-10-CM | POA: Diagnosis present

## 2016-11-18 DIAGNOSIS — M47812 Spondylosis without myelopathy or radiculopathy, cervical region: Secondary | ICD-10-CM | POA: Diagnosis present

## 2016-11-18 DIAGNOSIS — M791 Myalgia: Secondary | ICD-10-CM | POA: Diagnosis not present

## 2016-11-18 MED ORDER — HYDROCODONE-ACETAMINOPHEN 10-325 MG PO TABS: 1.0000 | ORAL_TABLET | Freq: Four times a day (QID) | ORAL | 0 refills | Status: DC | PRN
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
## 2016-11-20 DIAGNOSIS — H6122 Impacted cerumen, left ear: Secondary | ICD-10-CM | POA: Diagnosis not present

## 2017-01-13 ENCOUNTER — Encounter: Payer: Self-pay | Admitting: Registered Nurse

## 2017-01-13 ENCOUNTER — Encounter: Payer: 59 | Attending: Physical Medicine & Rehabilitation | Admitting: Registered Nurse

## 2017-01-13 VITALS — BP 109/76 | HR 72

## 2017-01-13 DIAGNOSIS — M961 Postlaminectomy syndrome, not elsewhere classified: Secondary | ICD-10-CM | POA: Insufficient documentation

## 2017-01-13 DIAGNOSIS — M609 Myositis, unspecified: Secondary | ICD-10-CM | POA: Insufficient documentation

## 2017-01-13 DIAGNOSIS — G894 Chronic pain syndrome: Secondary | ICD-10-CM

## 2017-01-13 DIAGNOSIS — M1711 Unilateral primary osteoarthritis, right knee: Secondary | ICD-10-CM | POA: Diagnosis not present

## 2017-01-13 DIAGNOSIS — M25562 Pain in left knee: Secondary | ICD-10-CM | POA: Diagnosis not present

## 2017-01-13 DIAGNOSIS — M47816 Spondylosis without myelopathy or radiculopathy, lumbar region: Secondary | ICD-10-CM

## 2017-01-13 DIAGNOSIS — F341 Dysthymic disorder: Secondary | ICD-10-CM | POA: Diagnosis present

## 2017-01-13 DIAGNOSIS — M62838 Other muscle spasm: Secondary | ICD-10-CM

## 2017-01-13 DIAGNOSIS — M7061 Trochanteric bursitis, right hip: Secondary | ICD-10-CM | POA: Diagnosis not present

## 2017-01-13 DIAGNOSIS — M47817 Spondylosis without myelopathy or radiculopathy, lumbosacral region: Secondary | ICD-10-CM | POA: Diagnosis present

## 2017-01-13 DIAGNOSIS — G890 Central pain syndrome: Secondary | ICD-10-CM | POA: Insufficient documentation

## 2017-01-13 DIAGNOSIS — M791 Myalgia: Secondary | ICD-10-CM | POA: Insufficient documentation

## 2017-01-13 DIAGNOSIS — M5412 Radiculopathy, cervical region: Secondary | ICD-10-CM | POA: Insufficient documentation

## 2017-01-13 DIAGNOSIS — M7062 Trochanteric bursitis, left hip: Secondary | ICD-10-CM

## 2017-01-13 DIAGNOSIS — F418 Other specified anxiety disorders: Secondary | ICD-10-CM | POA: Insufficient documentation

## 2017-01-13 DIAGNOSIS — G4700 Insomnia, unspecified: Secondary | ICD-10-CM

## 2017-01-13 DIAGNOSIS — M47812 Spondylosis without myelopathy or radiculopathy, cervical region: Secondary | ICD-10-CM | POA: Insufficient documentation

## 2017-01-13 DIAGNOSIS — Z5181 Encounter for therapeutic drug level monitoring: Secondary | ICD-10-CM | POA: Diagnosis present

## 2017-01-13 MED ORDER — HYDROCODONE-ACETAMINOPHEN 10-325 MG PO TABS: 1.0000 | ORAL_TABLET | Freq: Four times a day (QID) | ORAL | 0 refills | Status: DC | PRN
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
## 2017-01-15 ENCOUNTER — Telehealth: Payer: Self-pay | Admitting: Registered Nurse

## 2017-01-15 ENCOUNTER — Encounter: Payer: 59 | Admitting: Physical Medicine & Rehabilitation

## 2017-01-15 NOTE — Telephone Encounter (Signed)
On 01/15/2017 the NCCSR was reviewed no conflict was seen [place] the [place] Cont[place] with multiple prescribers. Ms. Merril AbbeDavishas a signed narcotic contract with our office. If there were any discrepancies this would have been reported to her physician.

## 2017-01-22 ENCOUNTER — Ambulatory Visit (INDEPENDENT_AMBULATORY_CARE_PROVIDER_SITE_OTHER): Payer: 59 | Admitting: Obstetrics and Gynecology

## 2017-01-22 ENCOUNTER — Encounter: Payer: Self-pay | Admitting: Obstetrics and Gynecology

## 2017-01-22 VITALS — BP 110/62 | HR 69 | Ht 60.0 in | Wt 124.0 lb

## 2017-01-22 DIAGNOSIS — Z1239 Encounter for other screening for malignant neoplasm of breast: Secondary | ICD-10-CM

## 2017-01-22 DIAGNOSIS — Z1231 Encounter for screening mammogram for malignant neoplasm of breast: Secondary | ICD-10-CM | POA: Diagnosis not present

## 2017-01-22 DIAGNOSIS — N952 Postmenopausal atrophic vaginitis: Secondary | ICD-10-CM | POA: Diagnosis not present

## 2017-01-22 DIAGNOSIS — Z124 Encounter for screening for malignant neoplasm of cervix: Secondary | ICD-10-CM

## 2017-01-22 DIAGNOSIS — Z01419 Encounter for gynecological examination (general) (routine) without abnormal findings: Secondary | ICD-10-CM | POA: Diagnosis not present

## 2017-01-22 DIAGNOSIS — Z1151 Encounter for screening for human papillomavirus (HPV): Secondary | ICD-10-CM | POA: Diagnosis not present

## 2017-01-22 MED ORDER — ESTROGENS, CONJUGATED 0.625 MG/GM VA CREA: TOPICAL_CREAM | VAGINAL | 1 refills | Status: DC

## 2017-01-22 NOTE — Progress Notes (Signed)
HPI:      [name] is a 56 y.o. G2P2 who LMP was No LMP recorded. Patient has had a hysterectomy., presents today for her annual examination.  Her menses are absent due to hyst. She does not have intermenstrual bleeding.  She does not have vasomotor sx.   Sex activity: single partner, contraception - status post hysterectomy. She does have vaginal dryness.She uses premarin vag crm with sx relief.   Last Pap: April 26, 2014  Results were: no abnormalities /neg HPV DNA. Plain pap 7/16.   Last mammogram: May 02, 2015  Results were: normal--routine follow-up in 12 months There is no FH of breast cancer. There is no FH of ovarian cancer. The patient does do self-breast exams.  Colonoscopy: currently managed by Dr. Mechele CollinElliott DEXA: hx of osteoporosis. Does calcium/vit D/exercise.  Tobacco use: The patient denies current or previous tobacco use. Alcohol use: none Exercise: moderately active  She does get adequate calcium and Vitamin D in her diet.   Past Medical History:  Diagnosis Date  . C. difficile colitis 2008  . Chronic cholecystitis 05/07/2013  . Colon polyps 2008  . GERD (gastroesophageal reflux disease)   . Hiatal hernia 2014  . Neuromuscular disorder (HCC) 2009   centralized pain syndrome/fibromylagia  . Osteoporosis     Past Surgical History:  Procedure Laterality Date  . ABDOMINAL HYSTERECTOMY  1989  . arm surgery     "muscle came off the elbow"  . CESAREAN SECTION    . CHOLECYSTECTOMY    . COLONOSCOPY  2008  . KNEE ARTHROSCOPY  2004/2008   x2  . NECK SURGERY    . SPINE SURGERY  2008   cervical fusion  . UPPER GI ENDOSCOPY  04-27-13   Dr Tracey HarriesBlazer    Family History  Problem Relation Age of Onset  . COPD Mother   . Asthma Mother   . Diabetes Brother   . Stroke Brother      ROS:  Review of Systems  Constitutional: Negative for fever, malaise/fatigue and weight loss.  HENT: Negative for congestion, ear pain and sinus pain.   Respiratory:  Negative for cough, shortness of breath and wheezing.   Cardiovascular: Negative for chest pain, orthopnea and leg swelling.  Gastrointestinal: Negative for constipation, diarrhea, nausea and vomiting.  Genitourinary: Negative for dysuria, frequency, hematuria and urgency.       Breast ROS: negative   Musculoskeletal: Positive for back pain and joint pain. Negative for myalgias.  Skin: Negative for itching and rash.  Neurological: Positive for headaches. Negative for dizziness, tingling and focal weakness.  Endo/Heme/Allergies: Negative for environmental allergies. Does not bruise/bleed easily.  Psychiatric/Behavioral: Negative for depression and suicidal ideas. The patient is not nervous/anxious and does not have insomnia.     Objective: BP 110/62 (BP Location: Left Arm, Patient Position: Sitting, Cuff Size: Normal)   Pulse 69   Ht 5' (1.524 m)   Wt 124 lb (56.2 kg)   BMI 24.22 kg/m    Physical Exam  Constitutional: She is oriented to person, place, and time. She appears well-developed and well-nourished.  Genitourinary: Vagina normal. No erythema or tenderness in the vagina. No vaginal discharge found. Right adnexum does not display mass and does not display tenderness. Left adnexum does not display mass and does not display tenderness.  Neck: Normal range of motion. No thyromegaly present.  Cardiovascular: Normal rate, regular rhythm and normal heart sounds.   No murmur heard. Pulmonary/Chest: Effort normal and breath  sounds normal. Right breast exhibits no mass, no nipple discharge, no skin change and no tenderness. Left breast exhibits no mass, no nipple discharge, no skin change and no tenderness.  Abdominal: Soft. There is no tenderness. There is no guarding.  Musculoskeletal: Normal range of motion.  Neurological: She is alert and oriented to person, place, and time. No cranial nerve deficit.  Psychiatric: She has a normal mood and affect. Her behavior is normal.  Vitals  reviewed.   Assessment/Plan:  Encounter for annual routine gynecological examination  Cervical cancer screening - Plan: IGP, Aptima HPV  Screening for HPV (human papillomavirus) - Plan: IGP, Aptima HPV  Screening for breast cancer - Pt to sched mammo. - Plan: MM SCREENING BREAST TOMO BILATERAL  Vaginal atrophy - Rx RF premarin vag crm. Coupon card/sample given to pt. - Plan: conjugated estrogens (PREMARIN) vaginal cream           GYN counsel mammography screening, menopause, adequate intake of calcium and vitamin D     F/U  Return in about 1 year (around 01/22/2018).  Chloeann Alfred B. Maryana Pittmon, PA-C 01/22/2017 11:51 AM

## 2017-01-23 ENCOUNTER — Ambulatory Visit: Payer: Self-pay | Admitting: Obstetrics and Gynecology

## 2017-01-25 LAB — IGP, APTIMA HPV
HPV APTIMA: NEGATIVE
PAP SMEAR COMMENT: 0

## 2017-03-11 ENCOUNTER — Encounter: Payer: 59 | Attending: Physical Medicine & Rehabilitation | Admitting: Physical Medicine & Rehabilitation

## 2017-03-11 ENCOUNTER — Encounter: Payer: Self-pay | Admitting: Physical Medicine & Rehabilitation

## 2017-03-11 VITALS — BP 85/56 | HR 61

## 2017-03-11 DIAGNOSIS — Z5181 Encounter for therapeutic drug level monitoring: Secondary | ICD-10-CM | POA: Diagnosis not present

## 2017-03-11 DIAGNOSIS — M609 Myositis, unspecified: Secondary | ICD-10-CM | POA: Insufficient documentation

## 2017-03-11 DIAGNOSIS — M47817 Spondylosis without myelopathy or radiculopathy, lumbosacral region: Secondary | ICD-10-CM | POA: Insufficient documentation

## 2017-03-11 DIAGNOSIS — M791 Myalgia: Secondary | ICD-10-CM | POA: Insufficient documentation

## 2017-03-11 DIAGNOSIS — Z79899 Other long term (current) drug therapy: Secondary | ICD-10-CM | POA: Diagnosis not present

## 2017-03-11 DIAGNOSIS — M5412 Radiculopathy, cervical region: Secondary | ICD-10-CM | POA: Insufficient documentation

## 2017-03-11 DIAGNOSIS — M961 Postlaminectomy syndrome, not elsewhere classified: Secondary | ICD-10-CM | POA: Insufficient documentation

## 2017-03-11 DIAGNOSIS — F418 Other specified anxiety disorders: Secondary | ICD-10-CM | POA: Insufficient documentation

## 2017-03-11 DIAGNOSIS — G890 Central pain syndrome: Secondary | ICD-10-CM | POA: Diagnosis not present

## 2017-03-11 DIAGNOSIS — M47812 Spondylosis without myelopathy or radiculopathy, cervical region: Secondary | ICD-10-CM | POA: Diagnosis present

## 2017-03-11 DIAGNOSIS — F341 Dysthymic disorder: Secondary | ICD-10-CM | POA: Diagnosis present

## 2017-03-11 MED ORDER — HYDROCODONE-ACETAMINOPHEN 10-325 MG PO TABS: 1.0000 | ORAL_TABLET | Freq: Four times a day (QID) | ORAL | 0 refills | Status: DC | PRN

## 2017-03-11 NOTE — Progress Notes (Signed)
Subjective:    Patient ID: Kathryn Farrell, female    DOB: [birth year], 56 y.o.   MRN: [id number]  HPI   Deztiny is here in follow up of her chronic pain. She states that her pain has been fairly stable. She feels that her neck strength and ROM is improving. She has been working on regular exercises daily as well. As a result her pain levels have improved in her neck.   She did develop some left jaw pain several weeks ago after her jaw may have dislocated her left jaw while singing. It is still sore but seems to be slowly improving.      Pain Inventory Average Pain 6 Pain Right Now 6 My pain is sharp, burning, dull, stabbing, tingling and aching  In the last 24 hours, has pain interfered with the following? General activity 7 Relation with others 6 Enjoyment of life 6 What TIME of day is your pain at its worst? all Sleep (in general) Fair  Pain is worse with: walking, bending, sitting, standing and some activites Pain improves with: rest, heat/ice, therapy/exercise, medication, TENS and injections Relief from Meds: 5  Mobility use a cane ability to climb steps?  no do you drive?  yes  Function disabled: date disabled .  Neuro/Psych weakness numbness tingling spasms confusion depression anxiety  Prior Studies Any changes since last visit?  no  Physicians involved in your care Any changes since last visit?  no   Family History  Problem Relation Age of Onset  . COPD Mother   . Asthma Mother   . Diabetes Brother   . Stroke Brother    Social History   Social History  . Marital status: Married    Spouse name: N/A  . Number of children: N/A  . Years of education: N/A   Social History Main Topics  . Smoking status: Former Smoker    Quit date: 10/28/1996  . Smokeless tobacco: Never Used  . Alcohol use No  . Drug use: No  . Sexual activity: Yes   Other Topics Concern  . Not on file   Social History Narrative  . No narrative on file   Past Surgical  History:  Procedure Laterality Date  . ABDOMINAL HYSTERECTOMY  1989  . arm surgery     "muscle came off the elbow"  . CESAREAN SECTION    . CHOLECYSTECTOMY    . COLONOSCOPY  2008  . KNEE ARTHROSCOPY  2004/2008   x2  . NECK SURGERY    . SPINE SURGERY  2008   cervical fusion  . UPPER GI ENDOSCOPY  04-27-13   Dr [name]   Past Medical History:  Diagnosis Date  . C. difficile colitis 2008  . Chronic cholecystitis 05/07/2013  . Colon polyps 2008  . GERD (gastroesophageal reflux disease)   . Hiatal hernia 2014  . Neuromuscular disorder (HCC) 2009   centralized pain syndrome/fibromylagia  . Osteoporosis    There were no vitals taken for this visit.  Opioid Risk Score:   Fall Risk Score:  `1  Depression screen PHQ 2/9  Depression screen [facility] 2/9 09/11/2016 06/24/2016 04/24/2016 01/03/2016 12/18/2015 07/18/2015 01/17/2015  Decreased Interest 1 1 0 3 3 3 3   Down, Depressed, Hopeless 1 1 0 2 2 2 3   PHQ - 2 Score 2 2 0 5 5 5 6   Altered sleeping - - - - - - 3  Tired, decreased energy - - - - - - 3  Change in appetite - - - - - -  3  Feeling bad or failure about yourself  - - - - - - 3  Trouble concentrating - - - - - - 3  Moving slowly or fidgety/restless - - - - - - 3  Suicidal thoughts - - - - - - 0  PHQ-9 Score - - - - - - 24    Review of Systems  Constitutional: Negative.   HENT: Negative.   Eyes: Negative.   Respiratory: Negative.   Cardiovascular: Negative.   Gastrointestinal: Positive for nausea.  Endocrine: Negative.   Genitourinary: Negative.   Musculoskeletal: Negative.   Skin: Negative.   Allergic/Immunologic: Negative.   Neurological: Negative.   Hematological: Negative.   Psychiatric/Behavioral: Negative.   All other systems reviewed and are negative.      Objective:   Physical Exam Constitutional: She is oriented to person, place, and time. She appears well-developed. She may have gained a little weight.  Eyes: Conjunctivae and EOM are normal. Pupils are  equal, round, and reactive to light.  Neck: Normal range of motion.  Cardiovascular: RRR  Pulmonary/Chest: CTA normal effort Abdominal: Soft.  Neurological: She is alert and oriented to person, place, and time. dtr's are 2+ RUE, 2+ bicep,ECR and 2+ tricep. Strength 5/5 in LUE---left shoulder sl weak. .  Musc:  hips sore with wb rom.  Psych: pleasant and bright  Skin: Minimal surgical scars neck    Assessment & Plan:  ASSESSMENT:  1. Centralized pain syndrome/ FMS  2. Lumbar spondylosis. With facet and disc disease. Left L5 radiculopathy (improved)  3. Bilateral greater trochanter bursitis.  4. Osteoarthritis, right knee and left knee. S/p scope right knee 10/26/13  5. Right forearm extensor mechanism injury.  6. 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 ? left C7 radiculitis ---scar tissue--no radiographic evidence of compromise---motor/DTR's stable on exam also 7. Insomnia  8. Depression.  9. Left shoulder pain, likely RTC/labaral injury with bursitis.  10. Right sacroiliac pain responsive to injection for in past.    PLAN:  1. UE exam stable--has chronic radic but may have some increasing sensory symptoms.              -continue with HEP,  ROM/strengthening/lengthening             -is making progress.  2. Hip mgt per ortho.   3. Hydrocodone for breakthrough pain. 10/325 with second RF for next month  5. Maintain celexa at current dosing.  6. Continue trazodone to 100mg . Sleep has improved.  7. Raynaud's treatment prn.  9. Zanaflex for muscle spasm in the thoracic and lumbar spine.  10. 15 minutes of face to face patient care time were spent during this visit. Zero. Ten minutes of face to face patient care time were spent during this visit. All questions were encouraged and answered.

## 2017-03-11 NOTE — Patient Instructions (Signed)
PLEASE FEEL FREE TO CALL OUR OFFICE WITH ANY PROBLEMS OR QUESTIONS ([phone])      

## 2017-03-15 LAB — TOXASSURE SELECT,+ANTIDEPR,UR

## 2017-03-17 ENCOUNTER — Telehealth: Payer: Self-pay | Admitting: *Deleted

## 2017-03-17 NOTE — Telephone Encounter (Signed)
Urine drug screen for this encounter is consistent for prescribed medication. She has not had the cyclobenzaprine and trazodone in the past year so absent is appropriate.

## 2017-04-02 DIAGNOSIS — M818 Other osteoporosis without current pathological fracture: Secondary | ICD-10-CM | POA: Diagnosis not present

## 2017-04-02 DIAGNOSIS — E782 Mixed hyperlipidemia: Secondary | ICD-10-CM | POA: Diagnosis not present

## 2017-04-02 DIAGNOSIS — E538 Deficiency of other specified B group vitamins: Secondary | ICD-10-CM | POA: Diagnosis not present

## 2017-04-09 DIAGNOSIS — E782 Mixed hyperlipidemia: Secondary | ICD-10-CM | POA: Diagnosis not present

## 2017-04-09 DIAGNOSIS — E538 Deficiency of other specified B group vitamins: Secondary | ICD-10-CM | POA: Diagnosis not present

## 2017-04-09 DIAGNOSIS — Z0000 Encounter for general adult medical examination without abnormal findings: Secondary | ICD-10-CM | POA: Diagnosis not present

## 2017-04-24 DIAGNOSIS — R079 Chest pain, unspecified: Secondary | ICD-10-CM | POA: Diagnosis not present

## 2017-05-09 ENCOUNTER — Encounter: Payer: 59 | Attending: Physical Medicine & Rehabilitation | Admitting: Registered Nurse

## 2017-05-09 ENCOUNTER — Encounter: Payer: Self-pay | Admitting: Registered Nurse

## 2017-05-09 ENCOUNTER — Telehealth: Payer: Self-pay | Admitting: Registered Nurse

## 2017-05-09 VITALS — BP 110/73 | HR 89

## 2017-05-09 DIAGNOSIS — M7061 Trochanteric bursitis, right hip: Secondary | ICD-10-CM | POA: Diagnosis not present

## 2017-05-09 DIAGNOSIS — M7062 Trochanteric bursitis, left hip: Secondary | ICD-10-CM

## 2017-05-09 DIAGNOSIS — M961 Postlaminectomy syndrome, not elsewhere classified: Secondary | ICD-10-CM | POA: Diagnosis present

## 2017-05-09 DIAGNOSIS — M791 Myalgia: Secondary | ICD-10-CM | POA: Diagnosis present

## 2017-05-09 DIAGNOSIS — M609 Myositis, unspecified: Secondary | ICD-10-CM | POA: Insufficient documentation

## 2017-05-09 DIAGNOSIS — M47817 Spondylosis without myelopathy or radiculopathy, lumbosacral region: Secondary | ICD-10-CM | POA: Diagnosis present

## 2017-05-09 DIAGNOSIS — M62838 Other muscle spasm: Secondary | ICD-10-CM

## 2017-05-09 DIAGNOSIS — M5412 Radiculopathy, cervical region: Secondary | ICD-10-CM | POA: Diagnosis present

## 2017-05-09 DIAGNOSIS — G894 Chronic pain syndrome: Secondary | ICD-10-CM | POA: Diagnosis not present

## 2017-05-09 DIAGNOSIS — F341 Dysthymic disorder: Secondary | ICD-10-CM | POA: Diagnosis present

## 2017-05-09 DIAGNOSIS — G890 Central pain syndrome: Secondary | ICD-10-CM | POA: Insufficient documentation

## 2017-05-09 DIAGNOSIS — Z79899 Other long term (current) drug therapy: Secondary | ICD-10-CM

## 2017-05-09 DIAGNOSIS — F418 Other specified anxiety disorders: Secondary | ICD-10-CM | POA: Diagnosis present

## 2017-05-09 DIAGNOSIS — Z5181 Encounter for therapeutic drug level monitoring: Secondary | ICD-10-CM | POA: Diagnosis not present

## 2017-05-09 DIAGNOSIS — M47812 Spondylosis without myelopathy or radiculopathy, cervical region: Secondary | ICD-10-CM | POA: Diagnosis present

## 2017-05-09 MED ORDER — HYDROCODONE-ACETAMINOPHEN 10-325 MG PO TABS: 1.0000 | ORAL_TABLET | Freq: Four times a day (QID) | ORAL | 0 refills | Status: DC | PRN
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
## 2017-05-09 NOTE — Telephone Encounter (Signed)
On 05/09/2017 the  NCCSR was reviewed no conflict was seen [place] the [facility] [place] with multiple prescribers. [name] has a signed narcotic contract with our office. If there were any discrepancies this would have been reported to her physician.

## 2017-05-12 ENCOUNTER — Ambulatory Visit: Payer: 59 | Admitting: Physical Medicine & Rehabilitation

## 2017-06-02 ENCOUNTER — Other Ambulatory Visit: Payer: Self-pay | Admitting: Dentistry

## 2017-06-02 DIAGNOSIS — M26629 Arthralgia of temporomandibular joint, unspecified side: Principal | ICD-10-CM

## 2017-06-02 DIAGNOSIS — G8929 Other chronic pain: Secondary | ICD-10-CM

## 2017-06-04 ENCOUNTER — Other Ambulatory Visit: Payer: Self-pay | Admitting: Dentistry

## 2017-06-04 DIAGNOSIS — G8929 Other chronic pain: Secondary | ICD-10-CM

## 2017-06-04 DIAGNOSIS — M26629 Arthralgia of temporomandibular joint, unspecified side: Principal | ICD-10-CM

## 2017-06-08 ENCOUNTER — Other Ambulatory Visit: Payer: 59

## 2017-06-09 ENCOUNTER — Ambulatory Visit
Admission: RE | Admit: 2017-06-09 | Discharge: 2017-06-09 | Disposition: A | Discharge status: Home / Self Care | Payer: 59 | Source: Ambulatory Visit | Attending: Dentistry | Admitting: Dentistry

## 2017-06-09 DIAGNOSIS — R600 Localized edema: Secondary | ICD-10-CM | POA: Diagnosis not present

## 2017-06-09 DIAGNOSIS — G8929 Other chronic pain: Secondary | ICD-10-CM

## 2017-06-09 DIAGNOSIS — M26629 Arthralgia of temporomandibular joint, unspecified side: Principal | ICD-10-CM

## 2017-06-19 IMAGING — MR MR LUMBAR SPINE W/O CM
4 of 5 series · 24 of 48 positions shown · non-contrast
Comparison: 08/25/2014

CLINICAL DATA: Lumbar disc disease with radiculopathy. Low back and
right buttock pain with some right leg pain. Numbness in the right
lower leg and foot. Symptoms for 4 months.

EXAM:
MRI LUMBAR SPINE WITHOUT CONTRAST
TECHNIQUE: Multiplanar, multisequence MR imaging of the lumbar spine was
performed. No intravenous contrast was administered.

[Series 2: T2 · sagittal · 4.0mm · 0.81mm/px · 5 of 15 slices shown (1 of 2)]
[im 1/15]
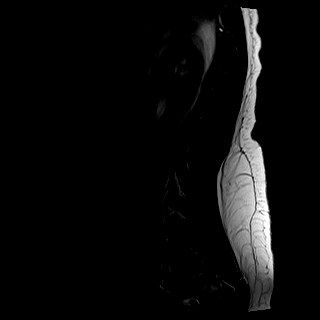
[im 4/15]
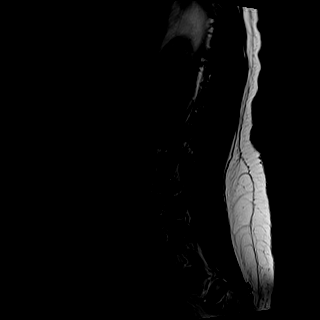
[im 8/15]
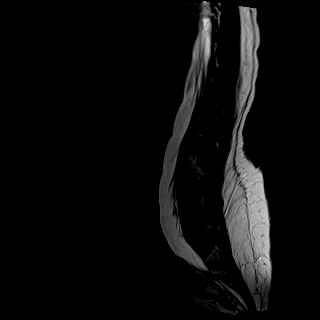
[im 11/15]
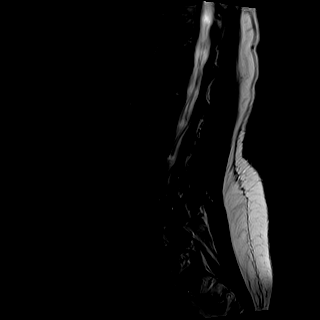
[im 15/15]
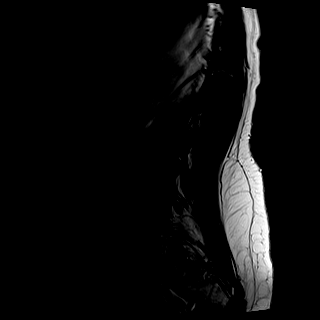

[Series 3: T1 · sagittal · 4.0mm · 0.41mm/px · 5 of 15 slices shown (1 of 2)]
[im 1/15]
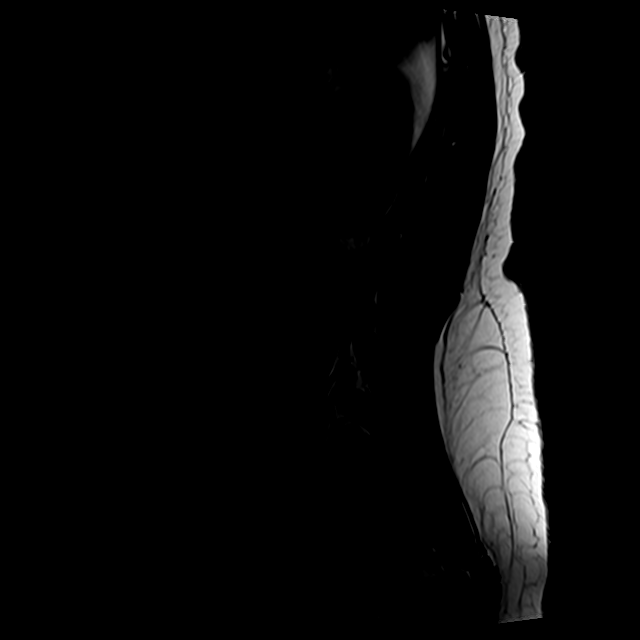
[im 4/15]
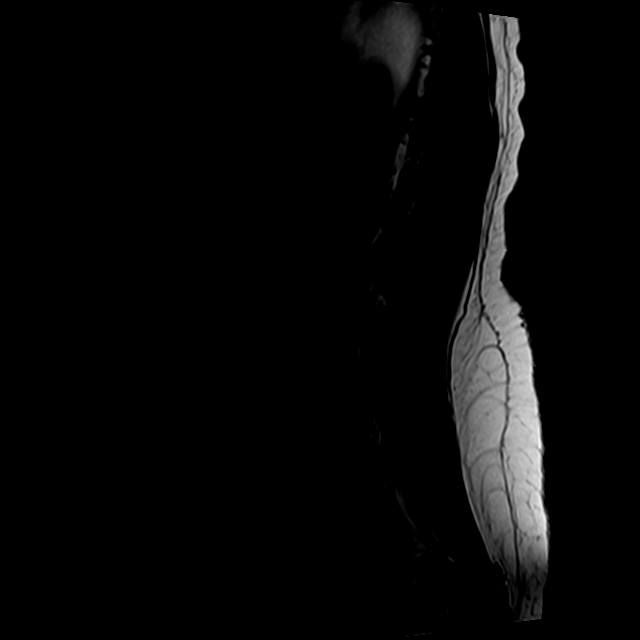
[im 8/15]
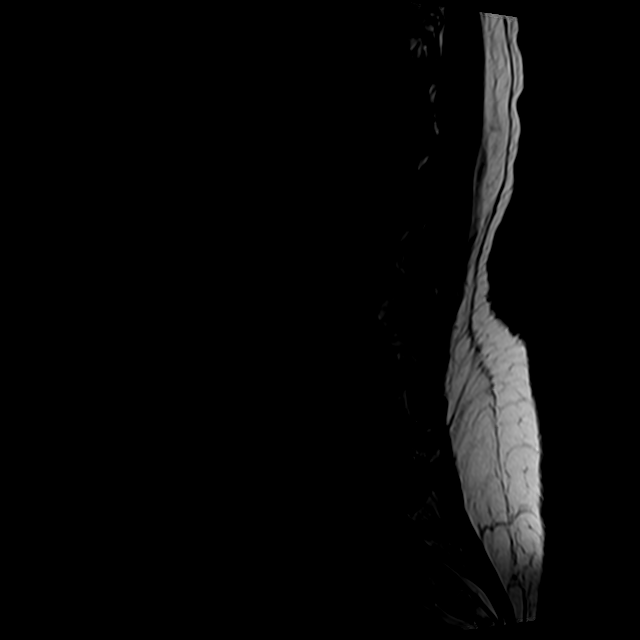
[im 11/15]
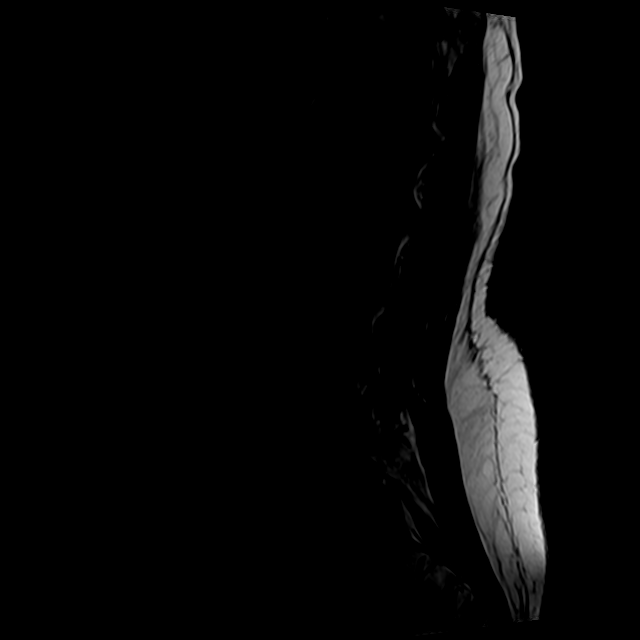
[im 15/15]
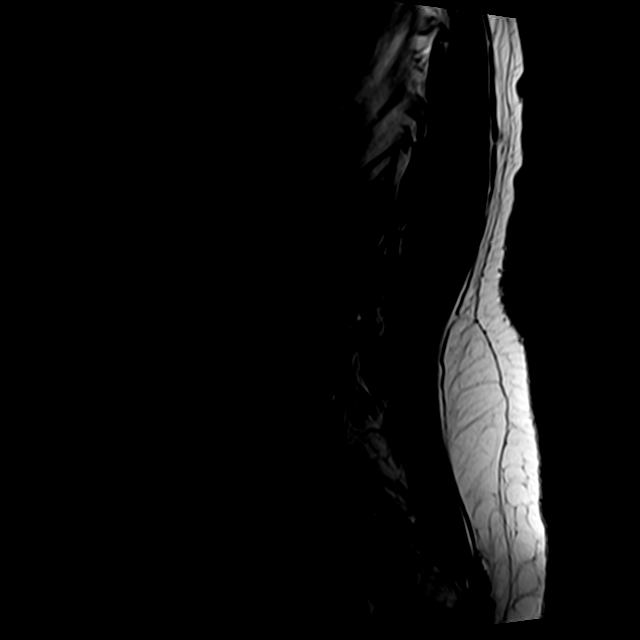

[Series 5: T2 · axial · 4.0mm · 0.78mm/px · z∈[-44,+183]mm · 10 of 42 slices shown (2 of 2)]
[im 3/42]
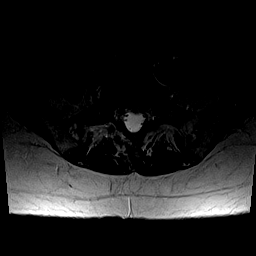
[im 6/42]
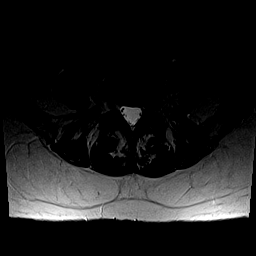
[im 9/42]
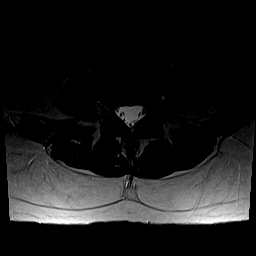
[im 14/42]
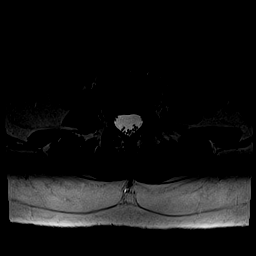
[im 20/42]
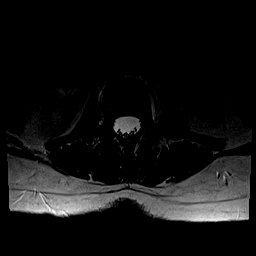
[im 22/42]
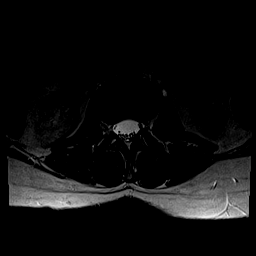
[im 25/42]
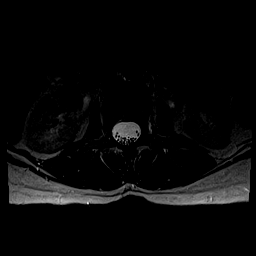
[im 31/42]
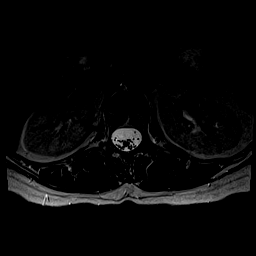
[im 36/42]
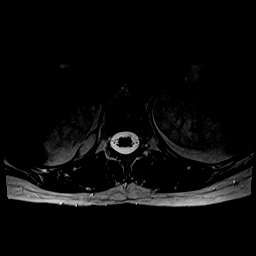
[im 42/42]
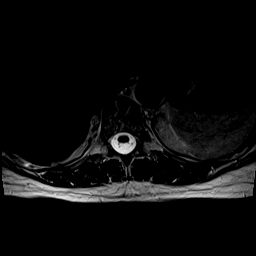

[Series 6: T1 · axial · 4.0mm · 0.31mm/px · z∈[-44,+153]mm · 4 of 42 slices shown (2 of 2)]
[im 3/42]
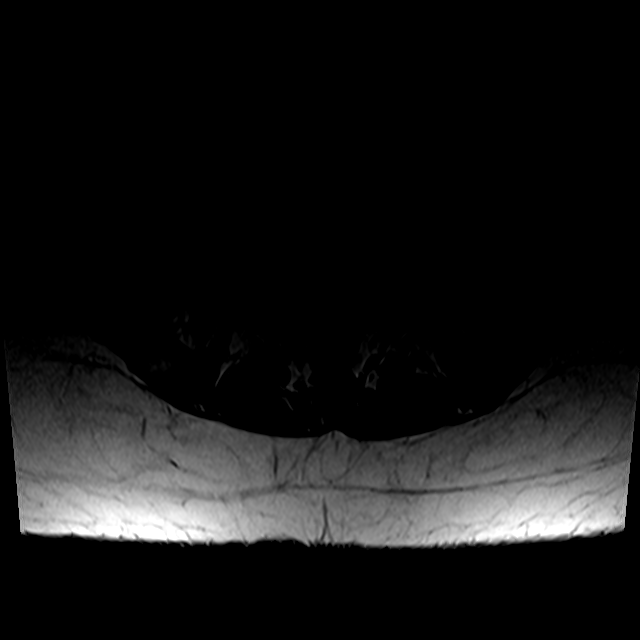
[im 6/42]
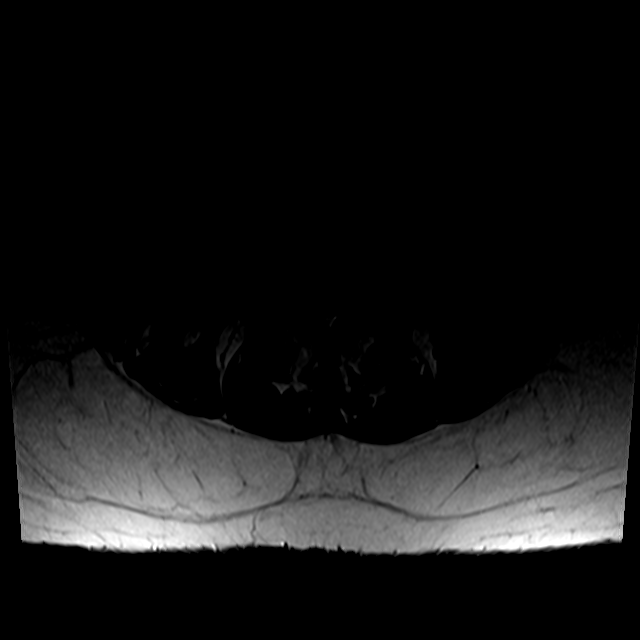
[im 22/42]
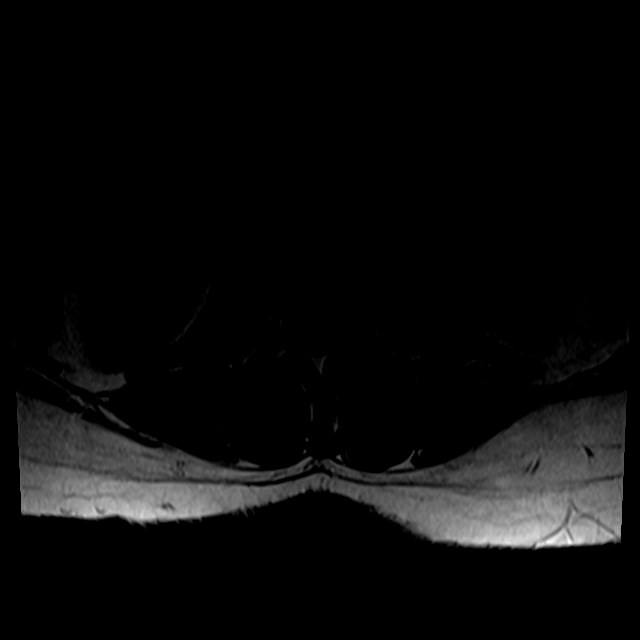
[im 36/42]
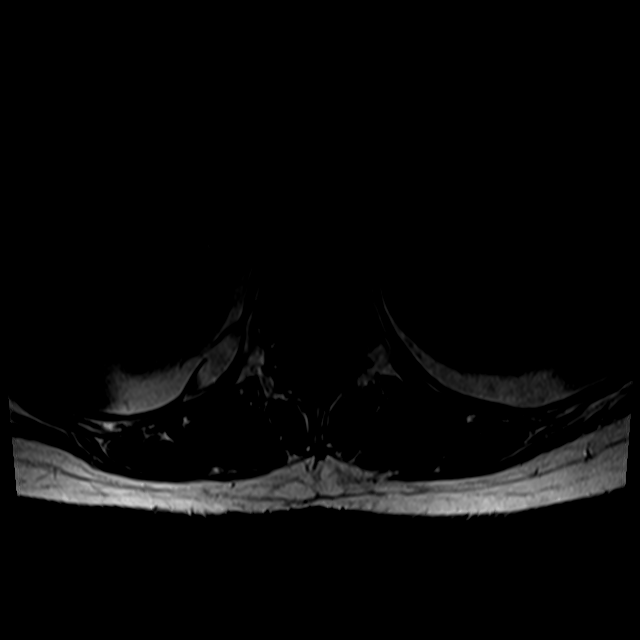

[24 of 48 positions shown; findings below may reference images not displayed]

FINDINGS: Vertebral alignment is unchanged, with trace retrolisthesis noted of
L2 on L3. Lumbar vertebral body heights are preserved. Chronic T12
superior endplate Schmorl's node is unchanged. No significant
vertebral marrow edema is seen. Disc desiccation is present at L1-2,
L2-3, and L5-S1 with at most minimal disc space height loss. The
conus medullaris is normal in signal and terminates at the inferior
aspect of L1. Paraspinal soft tissues are unremarkable.

L1-2:  Minimal disc bulging without stenosis, unchanged.

L2-3:  Minimal disc bulging without stenosis, unchanged.

L3-4:  Negative.

L4-5: Minimal facet arthrosis without disc herniation or stenosis,
unchanged.

L5-S1: Shallow left foraminal disc protrusion appears slightly
larger than on the prior study. Minimal disc bulging and minimal
facet arthrosis. No stenosis.
IMPRESSION: Mild lumbar spondylosis without stenosis. Minimally increased size
of a shallow left foraminal disc protrusion at L5-S1.

## 2017-07-04 DIAGNOSIS — M810 Age-related osteoporosis without current pathological fracture: Secondary | ICD-10-CM | POA: Diagnosis not present

## 2017-07-09 ENCOUNTER — Encounter: Payer: 59 | Attending: Physical Medicine & Rehabilitation | Admitting: Physical Medicine & Rehabilitation

## 2017-07-09 ENCOUNTER — Encounter: Payer: Self-pay | Admitting: Physical Medicine & Rehabilitation

## 2017-07-09 VITALS — BP 114/70 | HR 77

## 2017-07-09 DIAGNOSIS — F341 Dysthymic disorder: Secondary | ICD-10-CM | POA: Insufficient documentation

## 2017-07-09 DIAGNOSIS — F418 Other specified anxiety disorders: Secondary | ICD-10-CM | POA: Diagnosis present

## 2017-07-09 DIAGNOSIS — M961 Postlaminectomy syndrome, not elsewhere classified: Secondary | ICD-10-CM | POA: Diagnosis not present

## 2017-07-09 DIAGNOSIS — M47817 Spondylosis without myelopathy or radiculopathy, lumbosacral region: Secondary | ICD-10-CM | POA: Diagnosis present

## 2017-07-09 DIAGNOSIS — Z5181 Encounter for therapeutic drug level monitoring: Secondary | ICD-10-CM | POA: Insufficient documentation

## 2017-07-09 DIAGNOSIS — M609 Myositis, unspecified: Secondary | ICD-10-CM | POA: Insufficient documentation

## 2017-07-09 DIAGNOSIS — M791 Myalgia: Secondary | ICD-10-CM | POA: Diagnosis not present

## 2017-07-09 DIAGNOSIS — M47812 Spondylosis without myelopathy or radiculopathy, cervical region: Secondary | ICD-10-CM | POA: Diagnosis not present

## 2017-07-09 DIAGNOSIS — M7060 Trochanteric bursitis, unspecified hip: Secondary | ICD-10-CM

## 2017-07-09 DIAGNOSIS — G890 Central pain syndrome: Secondary | ICD-10-CM | POA: Insufficient documentation

## 2017-07-09 DIAGNOSIS — M5412 Radiculopathy, cervical region: Secondary | ICD-10-CM | POA: Diagnosis present

## 2017-07-09 MED ORDER — HYDROCODONE-ACETAMINOPHEN 10-325 MG PO TABS
1.0000 | ORAL_TABLET | Freq: Four times a day (QID) | ORAL | 0 refills | Status: DC | PRN
Start: 2017-07-09 — End: 2017-08-26

## 2017-07-09 MED ORDER — HYDROCODONE-ACETAMINOPHEN 10-325 MG PO TABS: 1.0000 | ORAL_TABLET | Freq: Four times a day (QID) | ORAL | 0 refills | Status: DC | PRN

## 2017-07-09 NOTE — Progress Notes (Signed)
Subjective:    Patient ID: Kathryn Farrell, female    DOB: 11/01/1960, 56 y.o.   MRN: [id number]  HPI   Kathryn Farrell here in follow up of her chronic pain. She has had increased pain in her left jaw. She Farrell seeing a specialist to follow up. A CT of the jaw showed mild cartilage loss and degenerative changes right more than left.   She deals with ongoing pain related to her knees and neck. She has been trying to remain active as a whole but has come to terms to an extent with some of the changes in her body/life/etc. She has found a lot of strength in her faith as well.   She Farrell using hydrocodone for pain control 10.325 one q6 prn    Pain Inventory Average Pain 8 Pain Right Now 7 My pain Farrell intermittent, sharp, dull and aching  In the last 24 hours, has pain interfered with the following? General activity 6 Relation with others 8 Enjoyment of life 10 What TIME of day Farrell your pain at its worst? morning Sleep (in general) Good  Pain Farrell worse with: walking, bending, sitting and standing Pain improves with: heat/ice, medication and injections Relief from Meds: 7  Mobility walk without assistance use a cane do you drive?  yes  Function disabled: date disabled 2014  Neuro/Psych weakness numbness tingling trouble walking spasms confusion  Prior Studies Any changes since last visit?  yes  Physicians involved in your care Any changes since last visit?  no   Family History  Problem Relation Age of Onset  . COPD Mother   . Asthma Mother   . Diabetes Brother   . Stroke Brother    Social History   Social History  . Marital status: Married    Spouse name: N/A  . Number of children: N/A  . Years of education: N/A   Social History Main Topics  . Smoking status: Former Smoker    Quit date: 10/28/1996  . Smokeless tobacco: Never Used  . Alcohol use No  . Drug use: No  . Sexual activity: Yes   Other Topics Concern  . Not on file   Social History Narrative  . No  narrative on file   Past Surgical History:  Procedure Laterality Date  . ABDOMINAL HYSTERECTOMY  1989  . arm surgery     "muscle came off the elbow"  . CESAREAN SECTION    . CHOLECYSTECTOMY    . COLONOSCOPY  2008  . KNEE ARTHROSCOPY  2004/2008   x2  . NECK SURGERY    . SPINE SURGERY  2008   cervical fusion  . UPPER GI ENDOSCOPY  04-27-13   Dr Tracey HarriesBlazer   Past Medical History:  Diagnosis Date  . C. difficile colitis 2008  . Chronic cholecystitis 05/07/2013  . Colon polyps 2008  . GERD (gastroesophageal reflux disease)   . Hiatal hernia 2014  . Neuromuscular disorder (HCC) 2009   centralized pain syndrome/fibromylagia  . Osteoporosis    There were no vitals taken for this visit.  Opioid Risk Score:   Fall Risk Score:  `1  Depression screen PHQ 2/9  Dep[facility] screen [facility] 2/9 09/11/2016 06/24/2016 04/24/2016 01/03/2016 12/18/2015 07/18/2015 01/17/2015  Decreased Interest 1 1 0 3 3 3 3   Down, Depressed, Hopeless 1 1 0 2 2 2 3   PHQ - 2 Score 2 2 0 5 5 5 6   Altered sleeping - - - - - - 3  Tired, decreased  energy - - - - - - 3  Change in appetite - - - - - - 3  Feeling bad or failure about yourself  - - - - - - 3  Trouble concentrating - - - - - - 3  Moving slowly or fidgety/restless - - - - - - 3  Suicidal thoughts - - - - - - 0  PHQ-9 Score - - - - - - 24     Review of Systems  Constitutional: Negative.   HENT: Negative.   Eyes: Negative.   Respiratory: Negative.   Cardiovascular: Negative.   Gastrointestinal: Negative.   Endocrine: Negative.   Genitourinary: Negative.   Musculoskeletal: Negative.   Skin: Negative.   Allergic/Immunologic: Negative.   Neurological: Negative.   Hematological: Negative.   Psychiatric/Behavioral: Negative.   All other systems reviewed and are negative.      Objective:   Physical Exam  Constitutional: She Farrell oriented to person, place, and time. She appears well-developed. She may have gained a little weight.  Eyes: Conjunctivae and  EOM are normal. Pupils are equal, round, and reactive to light.  Neck: Normal range of motion.  Cardiovascular: RRR  Pulmonary/Chest: CTA Bt Abdominal: Soft.  Neurological: She Farrell alert and oriented to person, place, and time. dtr's are 2+ RUE, 2+ bicep,ECR and 2+ tricep. Strength 5/5 in LUE---left shoulder stable.  Musc: hips tender with PROM. Head forward position. Neck tendr to palp along C3-7 levels.  Psych: pleasant and bright  Skin: Minimal surgical scars neck    Assessment & Plan:  ASSESSMENT:  1. Centralized pain syndrome/ FMS  2. Lumbar spondylosis. With facet and disc disease. Left L5 radiculopathy (improved)  3. Bilateral greater trochanter bursitis.  4. Osteoarthritis, right knee and left knee. S/p scope right knee 10/26/13  5. Right forearm extensor mechanism injury.  6. 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 ?left C7 radiculitis ---scar tissue--no radiographic evidence of compromise---motor/DTR's stable on exam also 7. Insomnia  8. Depression.  9. Left shoulder pain, likely RTC/labaral injury with bursitis.  10. Right sacroiliac pain responsive to injection for in past.  11. Bilateral TMJ pain, left more than right   PLAN:  1. UE exam stable--has chronic radic but may have some increasing sensory symptoms.  -continue with HEP. Reinforced importance of weight bearing exercises -Farrell making progress.  2. Hip mgt per ortho.   3. Hydrocodone for breakthrough pain. 10/325 with second RF for next month. We will continue the opioid monitoring program, this consists of regular clinic visits, examinations, urine drug screen, pill counts as well as use of [place] Controlled Substance Reporting System. NCCSRS was reviewed today.   5. Maintain celexa at current dosing.  6. Continue trazodone to . Sleep Farrell generally better.  7. Discussed use of bite guard at night, follow up with OMFS as directed.  9.  Zanaflex for muscle spasm in the thoracic and lumbar spine.  10. 15 minutes of face to face patient care time were spent during this visit. .15 minutes of face to face patient care time were spent during this visit. All questions were encouraged and answered.

## 2017-07-09 NOTE — Patient Instructions (Signed)
PLEASE FEEL FREE TO CALL OUR OFFICE WITH ANY PROBLEMS OR QUESTIONS ([phone])      

## 2017-08-01 DIAGNOSIS — E042 Nontoxic multinodular goiter: Secondary | ICD-10-CM | POA: Diagnosis not present

## 2017-08-05 DIAGNOSIS — E042 Nontoxic multinodular goiter: Secondary | ICD-10-CM | POA: Diagnosis not present

## 2017-08-12 DIAGNOSIS — M810 Age-related osteoporosis without current pathological fracture: Secondary | ICD-10-CM | POA: Diagnosis not present

## 2017-08-26 ENCOUNTER — Encounter: Payer: Self-pay | Admitting: Registered Nurse

## 2017-08-26 ENCOUNTER — Telehealth: Payer: Self-pay | Admitting: Registered Nurse

## 2017-08-26 ENCOUNTER — Encounter: Payer: 59 | Attending: Physical Medicine & Rehabilitation | Admitting: Registered Nurse

## 2017-08-26 VITALS — BP 98/65 | HR 67

## 2017-08-26 DIAGNOSIS — M47817 Spondylosis without myelopathy or radiculopathy, lumbosacral region: Secondary | ICD-10-CM | POA: Diagnosis not present

## 2017-08-26 DIAGNOSIS — F418 Other specified anxiety disorders: Secondary | ICD-10-CM | POA: Diagnosis present

## 2017-08-26 DIAGNOSIS — M609 Myositis, unspecified: Secondary | ICD-10-CM | POA: Insufficient documentation

## 2017-08-26 DIAGNOSIS — M5412 Radiculopathy, cervical region: Secondary | ICD-10-CM | POA: Diagnosis present

## 2017-08-26 DIAGNOSIS — F341 Dysthymic disorder: Secondary | ICD-10-CM | POA: Diagnosis present

## 2017-08-26 DIAGNOSIS — M1711 Unilateral primary osteoarthritis, right knee: Secondary | ICD-10-CM

## 2017-08-26 DIAGNOSIS — M961 Postlaminectomy syndrome, not elsewhere classified: Secondary | ICD-10-CM | POA: Insufficient documentation

## 2017-08-26 DIAGNOSIS — M7062 Trochanteric bursitis, left hip: Secondary | ICD-10-CM | POA: Diagnosis not present

## 2017-08-26 DIAGNOSIS — M47812 Spondylosis without myelopathy or radiculopathy, cervical region: Secondary | ICD-10-CM | POA: Diagnosis not present

## 2017-08-26 DIAGNOSIS — G890 Central pain syndrome: Secondary | ICD-10-CM | POA: Diagnosis present

## 2017-08-26 DIAGNOSIS — Z5181 Encounter for therapeutic drug level monitoring: Secondary | ICD-10-CM | POA: Insufficient documentation

## 2017-08-26 DIAGNOSIS — M7061 Trochanteric bursitis, right hip: Secondary | ICD-10-CM | POA: Diagnosis not present

## 2017-08-26 DIAGNOSIS — M797 Fibromyalgia: Secondary | ICD-10-CM

## 2017-08-26 MED ORDER — HYDROCODONE-ACETAMINOPHEN 10-325 MG PO TABS: 1.0000 | ORAL_TABLET | Freq: Four times a day (QID) | ORAL | 0 refills | Status: DC | PRN

## 2017-08-26 NOTE — Telephone Encounter (Signed)
On 08/26/2017 the NCCSR was reviewed no conflict was seen on the [place] [facility] Cont[place] Reporting System with multiple prescribers. [name]  has a signed narcotic contract with our office. If there were any discrepancies this would have been reported to her physician.
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
## 2017-08-29 ENCOUNTER — Encounter: Payer: 59 | Admitting: Registered Nurse

## 2017-10-23 ENCOUNTER — Encounter: Payer: Self-pay | Admitting: Registered Nurse

## 2017-10-23 ENCOUNTER — Encounter: Payer: 59 | Attending: Physical Medicine & Rehabilitation | Admitting: Registered Nurse

## 2017-10-23 VITALS — BP 104/69 | HR 75 | Resp 14

## 2017-10-23 DIAGNOSIS — M47812 Spondylosis without myelopathy or radiculopathy, cervical region: Secondary | ICD-10-CM

## 2017-10-23 DIAGNOSIS — M7061 Trochanteric bursitis, right hip: Secondary | ICD-10-CM

## 2017-10-23 DIAGNOSIS — M7062 Trochanteric bursitis, left hip: Secondary | ICD-10-CM | POA: Diagnosis not present

## 2017-10-23 DIAGNOSIS — M609 Myositis, unspecified: Secondary | ICD-10-CM | POA: Diagnosis not present

## 2017-10-23 DIAGNOSIS — M961 Postlaminectomy syndrome, not elsewhere classified: Secondary | ICD-10-CM

## 2017-10-23 DIAGNOSIS — F418 Other specified anxiety disorders: Secondary | ICD-10-CM | POA: Diagnosis present

## 2017-10-23 DIAGNOSIS — M797 Fibromyalgia: Secondary | ICD-10-CM | POA: Diagnosis not present

## 2017-10-23 DIAGNOSIS — M1711 Unilateral primary osteoarthritis, right knee: Secondary | ICD-10-CM

## 2017-10-23 DIAGNOSIS — F341 Dysthymic disorder: Secondary | ICD-10-CM | POA: Insufficient documentation

## 2017-10-23 DIAGNOSIS — M47817 Spondylosis without myelopathy or radiculopathy, lumbosacral region: Secondary | ICD-10-CM | POA: Diagnosis not present

## 2017-10-23 DIAGNOSIS — Z79899 Other long term (current) drug therapy: Secondary | ICD-10-CM | POA: Diagnosis not present

## 2017-10-23 DIAGNOSIS — G894 Chronic pain syndrome: Secondary | ICD-10-CM

## 2017-10-23 DIAGNOSIS — M5412 Radiculopathy, cervical region: Secondary | ICD-10-CM | POA: Diagnosis present

## 2017-10-23 DIAGNOSIS — Z5181 Encounter for therapeutic drug level monitoring: Secondary | ICD-10-CM | POA: Diagnosis present

## 2017-10-23 DIAGNOSIS — G890 Central pain syndrome: Secondary | ICD-10-CM

## 2017-10-23 MED ORDER — HYDROCODONE-ACETAMINOPHEN 10-325 MG PO TABS: 1.0000 | ORAL_TABLET | Freq: Four times a day (QID) | ORAL | 0 refills | Status: DC | PRN
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
## 2017-11-25 ENCOUNTER — Telehealth: Payer: Self-pay | Admitting: Registered Nurse

## 2017-11-25 ENCOUNTER — Telehealth: Payer: Self-pay | Admitting: *Deleted

## 2017-11-25 DIAGNOSIS — F418 Other specified anxiety disorders: Secondary | ICD-10-CM

## 2017-11-25 MED ORDER — HYDROCODONE-ACETAMINOPHEN 10-325 MG PO TABS: 1.0000 | ORAL_TABLET | Freq: Four times a day (QID) | ORAL | 0 refills | Status: DC | PRN

## 2017-11-25 NOTE — Telephone Encounter (Signed)
[name] called stating [facility] wouldn't fill her prescription. I placed a call to [facility], [name] picked up her last prescription on 10/29/2016, I asked for the to fill her prescription on 11/26/2017. Had to send a new prescription due a pharmacist put a lock on this provider prescription.

## 2017-11-25 NOTE — Telephone Encounter (Signed)
Prescription failed, placed a call to [facility]. Will call them again.

## 2017-11-25 NOTE — Telephone Encounter (Signed)
Received a call from [name], her Hydrocodone prescription was post dated on 11/30/2017. She picked up her last prescription on 10/29/2017. [name] will bring in the prescription to be discarded, new prescription sent.

## 2017-11-26 ENCOUNTER — Telehealth: Payer: Self-pay | Admitting: Registered Nurse

## 2017-11-26 DIAGNOSIS — F418 Other specified anxiety disorders: Secondary | ICD-10-CM

## 2017-11-26 MED ORDER — HYDROCODONE-ACETAMINOPHEN 10-325 MG PO TABS: 1.0000 | ORAL_TABLET | Freq: Four times a day (QID) | ORAL | 0 refills | Status: DC | PRN

## 2017-11-26 NOTE — Telephone Encounter (Signed)
Difficulty with e-scribe of Hydrocodone on o1/29/2018, after several attempts. Will try again, Kathryn Farrell asked if I could send prescription to [facility].

## 2017-12-03 ENCOUNTER — Telehealth: Payer: Self-pay | Admitting: Physical Medicine & Rehabilitation

## 2017-12-03 NOTE — Telephone Encounter (Signed)
She may have a medrol dose pack. Further needs re: back will require OV.  thanks

## 2017-12-03 NOTE — Telephone Encounter (Signed)
Pt phoned to state that her back went out and she is requesting Prednisone. Please advise.

## 2017-12-04 MED ORDER — METHYLPREDNISOLONE 4 MG PO TBPK: ORAL_TABLET | ORAL | 0 refills | Status: DC

## 2017-12-04 NOTE — Addendum Note (Signed)
Addended by: Doreene ElandSHUMAKER, Semira Stoltzfus W on: 12/04/2017 10:25 AM   Modules accepted: Orders

## 2017-12-04 NOTE — Telephone Encounter (Signed)
[facility]

## 2017-12-22 ENCOUNTER — Encounter: Payer: Self-pay | Admitting: Registered Nurse

## 2017-12-22 ENCOUNTER — Encounter: Payer: 59 | Attending: Physical Medicine & Rehabilitation | Admitting: Registered Nurse

## 2017-12-22 VITALS — BP 106/69 | HR 71

## 2017-12-22 DIAGNOSIS — M47817 Spondylosis without myelopathy or radiculopathy, lumbosacral region: Secondary | ICD-10-CM | POA: Diagnosis present

## 2017-12-22 DIAGNOSIS — M797 Fibromyalgia: Secondary | ICD-10-CM

## 2017-12-22 DIAGNOSIS — F418 Other specified anxiety disorders: Secondary | ICD-10-CM | POA: Diagnosis present

## 2017-12-22 DIAGNOSIS — M62838 Other muscle spasm: Secondary | ICD-10-CM

## 2017-12-22 DIAGNOSIS — M7062 Trochanteric bursitis, left hip: Secondary | ICD-10-CM | POA: Diagnosis not present

## 2017-12-22 DIAGNOSIS — M47812 Spondylosis without myelopathy or radiculopathy, cervical region: Secondary | ICD-10-CM

## 2017-12-22 DIAGNOSIS — M7061 Trochanteric bursitis, right hip: Secondary | ICD-10-CM

## 2017-12-22 DIAGNOSIS — M5412 Radiculopathy, cervical region: Secondary | ICD-10-CM | POA: Insufficient documentation

## 2017-12-22 DIAGNOSIS — M609 Myositis, unspecified: Secondary | ICD-10-CM | POA: Insufficient documentation

## 2017-12-22 DIAGNOSIS — G890 Central pain syndrome: Secondary | ICD-10-CM

## 2017-12-22 DIAGNOSIS — F341 Dysthymic disorder: Secondary | ICD-10-CM | POA: Insufficient documentation

## 2017-12-22 DIAGNOSIS — G894 Chronic pain syndrome: Secondary | ICD-10-CM

## 2017-12-22 DIAGNOSIS — M1711 Unilateral primary osteoarthritis, right knee: Secondary | ICD-10-CM | POA: Diagnosis not present

## 2017-12-22 DIAGNOSIS — Z79899 Other long term (current) drug therapy: Secondary | ICD-10-CM | POA: Diagnosis not present

## 2017-12-22 DIAGNOSIS — Z5181 Encounter for therapeutic drug level monitoring: Secondary | ICD-10-CM | POA: Diagnosis not present

## 2017-12-22 DIAGNOSIS — M1712 Unilateral primary osteoarthritis, left knee: Secondary | ICD-10-CM

## 2017-12-22 DIAGNOSIS — M961 Postlaminectomy syndrome, not elsewhere classified: Secondary | ICD-10-CM | POA: Diagnosis not present

## 2017-12-22 MED ORDER — HYDROCODONE-ACETAMINOPHEN 10-325 MG PO TABS: 1.0000 | ORAL_TABLET | Freq: Four times a day (QID) | ORAL | 0 refills | Status: DC | PRN
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
## 2017-12-24 ENCOUNTER — Other Ambulatory Visit: Payer: Self-pay | Admitting: Obstetrics and Gynecology

## 2017-12-24 ENCOUNTER — Telehealth: Payer: Self-pay

## 2017-12-24 DIAGNOSIS — N952 Postmenopausal atrophic vaginitis: Secondary | ICD-10-CM

## 2017-12-24 MED ORDER — ESTROGENS, CONJUGATED 0.625 MG/GM VA CREA: TOPICAL_CREAM | VAGINAL | 0 refills | Status: DC

## 2017-12-24 NOTE — Telephone Encounter (Signed)
Rx eRxd.  

## 2017-12-24 NOTE — Progress Notes (Signed)
Rx RF till annual 

## 2017-12-24 NOTE — Telephone Encounter (Signed)
Pt requesting refill for premarin vaginal cream. Her pharmacy, [facility] closed and she would like new rx sent to [facility]. Due for annual in April 2019. Thank you!

## 2017-12-27 LAB — TOXASSURE SELECT,+ANTIDEPR,UR

## 2017-12-29 ENCOUNTER — Telehealth: Payer: Self-pay | Admitting: *Deleted

## 2017-12-29 NOTE — Telephone Encounter (Signed)
Urine drug screen for this encounter is consistent for prescribed medication 

## 2018-01-20 ENCOUNTER — Other Ambulatory Visit: Payer: Self-pay | Admitting: Obstetrics and Gynecology

## 2018-01-20 DIAGNOSIS — N952 Postmenopausal atrophic vaginitis: Secondary | ICD-10-CM

## 2018-01-20 NOTE — Telephone Encounter (Signed)
Please advise for refill. Thank you.  

## 2018-01-23 ENCOUNTER — Other Ambulatory Visit: Payer: Self-pay | Admitting: Obstetrics and Gynecology

## 2018-01-23 DIAGNOSIS — N952 Postmenopausal atrophic vaginitis: Secondary | ICD-10-CM

## 2018-02-11 DIAGNOSIS — L578 Other skin changes due to chronic exposure to nonionizing radiation: Secondary | ICD-10-CM | POA: Diagnosis not present

## 2018-02-11 DIAGNOSIS — L878 Other transepidermal elimination disorders: Secondary | ICD-10-CM | POA: Diagnosis not present

## 2018-02-11 DIAGNOSIS — L812 Freckles: Secondary | ICD-10-CM | POA: Diagnosis not present

## 2018-02-17 ENCOUNTER — Encounter: Payer: 59 | Attending: Physical Medicine & Rehabilitation | Admitting: Physical Medicine & Rehabilitation

## 2018-02-17 ENCOUNTER — Encounter: Payer: Self-pay | Admitting: Physical Medicine & Rehabilitation

## 2018-02-17 VITALS — BP 108/64 | HR 61 | Ht 60.0 in | Wt 125.0 lb

## 2018-02-17 DIAGNOSIS — M47817 Spondylosis without myelopathy or radiculopathy, lumbosacral region: Secondary | ICD-10-CM | POA: Diagnosis not present

## 2018-02-17 DIAGNOSIS — M47812 Spondylosis without myelopathy or radiculopathy, cervical region: Secondary | ICD-10-CM

## 2018-02-17 DIAGNOSIS — Z5181 Encounter for therapeutic drug level monitoring: Secondary | ICD-10-CM | POA: Diagnosis present

## 2018-02-17 DIAGNOSIS — M7061 Trochanteric bursitis, right hip: Secondary | ICD-10-CM | POA: Diagnosis not present

## 2018-02-17 DIAGNOSIS — M961 Postlaminectomy syndrome, not elsewhere classified: Secondary | ICD-10-CM | POA: Insufficient documentation

## 2018-02-17 DIAGNOSIS — F418 Other specified anxiety disorders: Secondary | ICD-10-CM

## 2018-02-17 DIAGNOSIS — F341 Dysthymic disorder: Secondary | ICD-10-CM | POA: Diagnosis present

## 2018-02-17 DIAGNOSIS — M7062 Trochanteric bursitis, left hip: Secondary | ICD-10-CM

## 2018-02-17 DIAGNOSIS — M609 Myositis, unspecified: Secondary | ICD-10-CM | POA: Diagnosis not present

## 2018-02-17 DIAGNOSIS — M5412 Radiculopathy, cervical region: Secondary | ICD-10-CM | POA: Diagnosis present

## 2018-02-17 DIAGNOSIS — G890 Central pain syndrome: Secondary | ICD-10-CM | POA: Insufficient documentation

## 2018-02-17 MED ORDER — HYDROCODONE-ACETAMINOPHEN 10-325 MG PO TABS: 1.0000 | ORAL_TABLET | Freq: Four times a day (QID) | ORAL | 0 refills | Status: DC | PRN

## 2018-02-17 NOTE — Patient Instructions (Signed)
PLEASE FEEL FREE TO CALL OUR OFFICE WITH ANY PROBLEMS OR QUESTIONS [phone]     CONSIDER VARYING YOUR EXERCISE----BIKE, ELIPTICAL, POOL, ETC

## 2018-02-17 NOTE — Progress Notes (Signed)
Subjective:    Patient ID: Kathryn Farrell, female    DOB: 12/04/1960, 57 y.o.   MRN: [id number]  HPI   Kathryn Farrell is here in follow up of her chronic pain. She states that she has actually been doing fairly well. She goes through her ups and downs with pain. She exercise when she can. She is watching her diet. She has noticed that her hips/groin tend to bother when she walks for longer distances. I asked if she's doing much besides walking for aerobic exercise and she's not.   She enjoys her grandchildren as well and spends time with them.   She maintains on hydrocodone for pain control. She uses it typically 4 x daily. She uses her flexeril on occasion which works but does make her sleepy.    Pain Inventory Average Pain 5 Pain Right Now 6 My pain is sharp, burning, dull, stabbing, tingling and aching  In the last 24 hours, has pain interfered with the following? General activity 5 Relation with others 4 Enjoyment of life 4 What TIME of day is your pain at its worst? morning Sleep (in general) Fair  Pain is worse with: walking, bending, sitting and standing Pain improves with: rest, heat/ice, medication and injections Relief from Meds: 7  Mobility ability to climb steps?  yes  Function disabled: date disabled 2014  Neuro/Psych weakness  Prior Studies Any changes since last visit?  no  Physicians involved in your care Any changes since last visit?  no   Family History  Problem Relation Age of Onset  . COPD Mother   . Asthma Mother   . Diabetes Brother   . Stroke Brother    Social History   Socioeconomic History  . Marital status: Married    Spouse name: Not on file  . Number of children: Not on file  . Years of education: Not on file  . Highest education level: Not on file  Occupational History  . Not on file  Social Needs  . Financial resource strain: Not on file  . Food insecurity:    Worry: Not on file    Inability: Not on file  . Transportation  needs:    Medical: Not on file    Non-medical: Not on file  Tobacco Use  . Smoking status: Former Smoker    Last attempt to quit: 10/28/1996    Years since quitting: 21.3  . Smokeless tobacco: Never Used  Substance and Sexual Activity  . Alcohol use: No  . Drug use: No  . Sexual activity: Yes  Lifestyle  . Physical activity:    Days per week: Not on file    Minutes per session: Not on file  . Stress: Not on file  Relationships  . Social connections:    Talks on phone: Not on file    Gets together: Not on file    Attends religious service: Not on file    Active member of club or organization: Not on file    Attends meetings of clubs or organizations: Not on file    Relationship status: Not on file  Other Topics Concern  . Not on file  Social History Narrative  . Not on file   Past Surgical History:  Procedure Laterality Date  . ABDOMINAL HYSTERECTOMY  1989  . arm surgery     "muscle came off the elbow"  . CESAREAN SECTION    . CHOLECYSTECTOMY    . COLONOSCOPY  2008  . KNEE ARTHROSCOPY  2004/2008   x2  . NECK SURGERY    . SPINE SURGERY  2008   cervical fusion  . UPPER GI ENDOSCOPY  04-27-13   Dr [name]   Past Medical History:  Diagnosis Date  . C. difficile colitis 2008  . Chronic cholecystitis 05/07/2013  . Colon polyps 2008  . GERD (gastroesophageal reflux disease)   . Hiatal hernia 2014  . Neuromuscular disorder (HCC) 2009   centralized pain syndrome/fibromylagia  . Osteoporosis    There were no vitals taken for this visit.  Opioid Risk Score:   Fall Risk Score:  `1  Depression screen PHQ 2/9  Depression screen [facility] 2/9 09/11/2016 06/24/2016 04/24/2016 01/03/2016 12/18/2015 07/18/2015 01/17/2015  Decreased Interest 1 1 0 3 3 3 3   Down, Depressed, Hopeless 1 1 0 2 2 2 3   PHQ - 2 Score 2 2 0 5 5 5 6   Altered sleeping - - - - - - 3  Tired, decreased energy - - - - - - 3  Change in appetite - - - - - - 3  Feeling bad or failure about yourself  - - - - - - 3    Trouble concentrating - - - - - - 3  Moving slowly or fidgety/restless - - - - - - 3  Suicidal thoughts - - - - - - 0  PHQ-9 Score - - - - - - 24     Review of Systems  Constitutional: Negative.   HENT: Negative.   Eyes: Negative.   Respiratory: Negative.   Cardiovascular: Negative.   Gastrointestinal: Negative.   Endocrine: Negative.   Genitourinary: Negative.   Musculoskeletal: Positive for arthralgias and myalgias.  Skin: Negative.   Allergic/Immunologic: Negative.   Neurological: Positive for weakness.  Hematological: Negative.   Psychiatric/Behavioral: Negative.   All other systems reviewed and are negative.      Objective:   Physical Exam  General: No acute distress HEENT: EOMI, oral membranes moist Cards: reg rate  Chest: normal effort Abdomen: Soft, NT, ND Skin: dry, intact Extremities: no edema Neurological: She is alert and oriented to person, place, and time. dtr's are 2+ RUE, 2+ bicep,ECR and 2+ tricep. Strength 5/5 in LUE---stable exam  Musc: hips tender with PROM, some antalgia with ambulation. Head forward position. Neck tendr to palp along C3-7 levels.  Psych: pleasant  Skin: Minimal surgical scars neck    Assessment & Plan:  ASSESSMENT:  1. Centralized pain syndrome/ FMS  2. Lumbar spondylosis. With facet and disc disease. Left L5 radiculopathy (improved)  3. Bilateral greater trochanter bursitis.  4. Osteoarthritis, right knee and left knee. S/p scope right knee 10/26/13  5. Right forearm extensor mechanism injury.  6. 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 ?left C7 radiculitis ---scar tissue--no radiographic evidence of compromise---motor/DTR's stable on exam also 7. Insomnia  8. Depression.  9. Left shoulder pain, likely RTC/labaral injury with bursitis.  10. Right sacroiliac pain responsive to injection for in past.  11. Bilateral TMJ pain, left more than right   PLAN:  1. Continue with HEP.  Discussed other types of aerobic exercise which may be less impactful on hips. 2. Hip mgt per ortho.  3. Hydrocodone for breakthrough pain. 10/325. A second RF was provided for next month. We will continue the controlled substance monitoring program, this consists of regular clinic visits, examinations, routine drug screening, pill counts as well as use of [place] Controlled Substance Reporting System. [place]  [place] today.   5. Maintain celexa at current dosing.  6. Continue trazodone to 100mg . Sleep is generally better.  7.  of face to face patient care time were spent during this visit. All questions were encouraged and answered. Follow up in 2 months.

## 2018-04-07 NOTE — Telephone Encounter (Signed)
error 

## 2018-04-16 ENCOUNTER — Encounter: Payer: 59 | Attending: Physical Medicine & Rehabilitation | Admitting: Registered Nurse

## 2018-04-16 ENCOUNTER — Encounter: Payer: Self-pay | Admitting: Registered Nurse

## 2018-04-16 VITALS — BP 92/61 | HR 77 | Ht 60.0 in | Wt 122.0 lb

## 2018-04-16 DIAGNOSIS — F341 Dysthymic disorder: Secondary | ICD-10-CM | POA: Insufficient documentation

## 2018-04-16 DIAGNOSIS — M47817 Spondylosis without myelopathy or radiculopathy, lumbosacral region: Secondary | ICD-10-CM | POA: Diagnosis not present

## 2018-04-16 DIAGNOSIS — M542 Cervicalgia: Secondary | ICD-10-CM

## 2018-04-16 DIAGNOSIS — M7062 Trochanteric bursitis, left hip: Secondary | ICD-10-CM

## 2018-04-16 DIAGNOSIS — M62838 Other muscle spasm: Secondary | ICD-10-CM

## 2018-04-16 DIAGNOSIS — Z79899 Other long term (current) drug therapy: Secondary | ICD-10-CM | POA: Diagnosis not present

## 2018-04-16 DIAGNOSIS — M7061 Trochanteric bursitis, right hip: Secondary | ICD-10-CM

## 2018-04-16 DIAGNOSIS — G890 Central pain syndrome: Secondary | ICD-10-CM | POA: Insufficient documentation

## 2018-04-16 DIAGNOSIS — Z5181 Encounter for therapeutic drug level monitoring: Secondary | ICD-10-CM

## 2018-04-16 DIAGNOSIS — M47812 Spondylosis without myelopathy or radiculopathy, cervical region: Secondary | ICD-10-CM | POA: Diagnosis not present

## 2018-04-16 DIAGNOSIS — G894 Chronic pain syndrome: Secondary | ICD-10-CM | POA: Diagnosis not present

## 2018-04-16 DIAGNOSIS — M25511 Pain in right shoulder: Secondary | ICD-10-CM | POA: Diagnosis not present

## 2018-04-16 DIAGNOSIS — M609 Myositis, unspecified: Secondary | ICD-10-CM | POA: Diagnosis not present

## 2018-04-16 DIAGNOSIS — F418 Other specified anxiety disorders: Secondary | ICD-10-CM | POA: Insufficient documentation

## 2018-04-16 DIAGNOSIS — M25512 Pain in left shoulder: Secondary | ICD-10-CM

## 2018-04-16 DIAGNOSIS — M797 Fibromyalgia: Secondary | ICD-10-CM

## 2018-04-16 DIAGNOSIS — M5412 Radiculopathy, cervical region: Secondary | ICD-10-CM

## 2018-04-16 DIAGNOSIS — M961 Postlaminectomy syndrome, not elsewhere classified: Secondary | ICD-10-CM | POA: Insufficient documentation

## 2018-04-16 DIAGNOSIS — G8929 Other chronic pain: Secondary | ICD-10-CM

## 2018-04-16 MED ORDER — HYDROCODONE-ACETAMINOPHEN 10-325 MG PO TABS: 1.0000 | ORAL_TABLET | Freq: Four times a day (QID) | ORAL | 0 refills | Status: DC | PRN
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
## 2018-04-19 LAB — DRUG TOX MONITOR 1 W/CONF, ORAL FLD
ALPRAZOLAM: 1.06 ng/mL — AB (ref <0.50)
Amphetamines: NEGATIVE ng/mL (ref <10)
BENZODIAZEPINES: POSITIVE ng/mL — AB (ref <0.50)
Barbiturates: NEGATIVE ng/mL (ref <10)
Buprenorphine: NEGATIVE ng/mL (ref <0.10)
CHLORDIAZEPOXIDE: NEGATIVE ng/mL (ref <0.50)
COCAINE: NEGATIVE ng/mL (ref <5.0)
Clonazepam: NEGATIVE ng/mL (ref <0.50)
Codeine: NEGATIVE ng/mL (ref <2.5)
DIAZEPAM: NEGATIVE ng/mL (ref <0.50)
Dihydrocodeine: 9.9 ng/mL — ABNORMAL HIGH (ref <2.5)
FENTANYL: NEGATIVE ng/mL (ref <0.10)
FLUNITRAZEPAM: NEGATIVE ng/mL (ref <0.50)
Flurazepam: NEGATIVE ng/mL (ref <0.50)
Heroin Metabolite: NEGATIVE ng/mL (ref <1.0)
Hydrocodone: 158.6 ng/mL — ABNORMAL HIGH (ref <2.5)
Hydromorphone: NEGATIVE ng/mL (ref <2.5)
Lorazepam: NEGATIVE ng/mL (ref <0.50)
MARIJUANA: NEGATIVE ng/mL (ref <2.5)
MDMA: NEGATIVE ng/mL (ref <10)
MEPROBAMATE: NEGATIVE ng/mL (ref <2.5)
MORPHINE: NEGATIVE ng/mL (ref <2.5)
Methadone: NEGATIVE ng/mL (ref <5.0)
Midazolam: NEGATIVE ng/mL (ref <0.50)
NORHYDROCODONE: 7.2 ng/mL — AB (ref <2.5)
NOROXYCODONE: NEGATIVE ng/mL (ref <2.5)
Nicotine Metabolite: NEGATIVE ng/mL (ref <5.0)
Nordiazepam: NEGATIVE ng/mL (ref <0.50)
OXAZEPAM: NEGATIVE ng/mL (ref <0.50)
Opiates: POSITIVE ng/mL — AB (ref <2.5)
Oxycodone: NEGATIVE ng/mL (ref <2.5)
Oxymorphone: NEGATIVE ng/mL (ref <2.5)
Phencyclidine: NEGATIVE ng/mL (ref <10)
TEMAZEPAM: NEGATIVE ng/mL (ref <0.50)
Tapentadol: NEGATIVE ng/mL (ref <5.0)
Tramadol: NEGATIVE ng/mL (ref <5.0)
Triazolam: NEGATIVE ng/mL (ref <0.50)
ZOLPIDEM: NEGATIVE ng/mL (ref <5.0)

## 2018-04-19 LAB — DRUG TOX ALC METAB W/CON, ORAL FLD: Alcohol Metabolite: NEGATIVE ng/mL (ref <25)

## 2018-04-20 ENCOUNTER — Telehealth: Payer: Self-pay | Admitting: *Deleted

## 2018-04-20 NOTE — Telephone Encounter (Signed)
Oral swab drug screen was consistent for prescribed medications.  ?

## 2018-04-21 ENCOUNTER — Ambulatory Visit: Payer: 59 | Admitting: Registered Nurse

## 2018-05-27 DIAGNOSIS — H02423 Myogenic ptosis of bilateral eyelids: Secondary | ICD-10-CM | POA: Diagnosis not present

## 2018-06-04 ENCOUNTER — Encounter: Payer: 59 | Attending: Physical Medicine & Rehabilitation | Admitting: Registered Nurse

## 2018-06-04 ENCOUNTER — Encounter: Payer: Self-pay | Admitting: Registered Nurse

## 2018-06-04 VITALS — BP 95/61 | HR 69 | Resp 14 | Ht 60.0 in | Wt 122.0 lb

## 2018-06-04 DIAGNOSIS — F341 Dysthymic disorder: Secondary | ICD-10-CM | POA: Diagnosis present

## 2018-06-04 DIAGNOSIS — G8929 Other chronic pain: Secondary | ICD-10-CM

## 2018-06-04 DIAGNOSIS — M47812 Spondylosis without myelopathy or radiculopathy, cervical region: Secondary | ICD-10-CM | POA: Diagnosis not present

## 2018-06-04 DIAGNOSIS — M5412 Radiculopathy, cervical region: Secondary | ICD-10-CM | POA: Diagnosis not present

## 2018-06-04 DIAGNOSIS — G894 Chronic pain syndrome: Secondary | ICD-10-CM

## 2018-06-04 DIAGNOSIS — M7061 Trochanteric bursitis, right hip: Secondary | ICD-10-CM

## 2018-06-04 DIAGNOSIS — M25511 Pain in right shoulder: Secondary | ICD-10-CM

## 2018-06-04 DIAGNOSIS — F418 Other specified anxiety disorders: Secondary | ICD-10-CM | POA: Diagnosis present

## 2018-06-04 DIAGNOSIS — Z5181 Encounter for therapeutic drug level monitoring: Secondary | ICD-10-CM | POA: Diagnosis present

## 2018-06-04 DIAGNOSIS — M47817 Spondylosis without myelopathy or radiculopathy, lumbosacral region: Secondary | ICD-10-CM | POA: Diagnosis present

## 2018-06-04 DIAGNOSIS — Z79899 Other long term (current) drug therapy: Secondary | ICD-10-CM

## 2018-06-04 DIAGNOSIS — M609 Myositis, unspecified: Secondary | ICD-10-CM | POA: Diagnosis present

## 2018-06-04 DIAGNOSIS — M961 Postlaminectomy syndrome, not elsewhere classified: Secondary | ICD-10-CM | POA: Insufficient documentation

## 2018-06-04 DIAGNOSIS — M7062 Trochanteric bursitis, left hip: Secondary | ICD-10-CM

## 2018-06-04 DIAGNOSIS — M542 Cervicalgia: Secondary | ICD-10-CM | POA: Diagnosis not present

## 2018-06-04 DIAGNOSIS — M25512 Pain in left shoulder: Secondary | ICD-10-CM

## 2018-06-04 DIAGNOSIS — M62838 Other muscle spasm: Secondary | ICD-10-CM

## 2018-06-04 DIAGNOSIS — M797 Fibromyalgia: Secondary | ICD-10-CM

## 2018-06-04 DIAGNOSIS — G890 Central pain syndrome: Secondary | ICD-10-CM | POA: Diagnosis present

## 2018-06-04 MED ORDER — HYDROCODONE-ACETAMINOPHEN 10-325 MG PO TABS: 1.0000 | ORAL_TABLET | Freq: Four times a day (QID) | ORAL | 0 refills | Status: DC | PRN
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
## 2018-06-15 ENCOUNTER — Ambulatory Visit: Payer: 59 | Admitting: Registered Nurse

## 2018-08-05 ENCOUNTER — Encounter: Payer: Self-pay | Admitting: Registered Nurse

## 2018-08-05 ENCOUNTER — Encounter: Payer: 59 | Attending: Physical Medicine & Rehabilitation | Admitting: Registered Nurse

## 2018-08-05 VITALS — BP 104/68 | HR 70 | Ht 60.0 in | Wt 124.0 lb

## 2018-08-05 DIAGNOSIS — M609 Myositis, unspecified: Secondary | ICD-10-CM | POA: Insufficient documentation

## 2018-08-05 DIAGNOSIS — M5412 Radiculopathy, cervical region: Secondary | ICD-10-CM | POA: Diagnosis not present

## 2018-08-05 DIAGNOSIS — G890 Central pain syndrome: Secondary | ICD-10-CM | POA: Diagnosis present

## 2018-08-05 DIAGNOSIS — M542 Cervicalgia: Secondary | ICD-10-CM | POA: Diagnosis not present

## 2018-08-05 DIAGNOSIS — F418 Other specified anxiety disorders: Secondary | ICD-10-CM | POA: Insufficient documentation

## 2018-08-05 DIAGNOSIS — F341 Dysthymic disorder: Secondary | ICD-10-CM | POA: Diagnosis present

## 2018-08-05 DIAGNOSIS — M47812 Spondylosis without myelopathy or radiculopathy, cervical region: Secondary | ICD-10-CM | POA: Diagnosis not present

## 2018-08-05 DIAGNOSIS — Z5181 Encounter for therapeutic drug level monitoring: Secondary | ICD-10-CM

## 2018-08-05 DIAGNOSIS — M961 Postlaminectomy syndrome, not elsewhere classified: Secondary | ICD-10-CM | POA: Insufficient documentation

## 2018-08-05 DIAGNOSIS — M797 Fibromyalgia: Secondary | ICD-10-CM

## 2018-08-05 DIAGNOSIS — M62838 Other muscle spasm: Secondary | ICD-10-CM

## 2018-08-05 DIAGNOSIS — M7062 Trochanteric bursitis, left hip: Secondary | ICD-10-CM

## 2018-08-05 DIAGNOSIS — M47817 Spondylosis without myelopathy or radiculopathy, lumbosacral region: Secondary | ICD-10-CM | POA: Diagnosis present

## 2018-08-05 DIAGNOSIS — M7061 Trochanteric bursitis, right hip: Secondary | ICD-10-CM

## 2018-08-05 DIAGNOSIS — G894 Chronic pain syndrome: Secondary | ICD-10-CM

## 2018-08-05 DIAGNOSIS — Z79899 Other long term (current) drug therapy: Secondary | ICD-10-CM

## 2018-08-05 DIAGNOSIS — M2550 Pain in unspecified joint: Secondary | ICD-10-CM

## 2018-08-05 MED ORDER — HYDROCODONE-ACETAMINOPHEN 10-325 MG PO TABS: 1.0000 | ORAL_TABLET | Freq: Four times a day (QID) | ORAL | 0 refills | Status: DC | PRN
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
## 2018-10-01 DIAGNOSIS — M19012 Primary osteoarthritis, left shoulder: Secondary | ICD-10-CM | POA: Diagnosis not present

## 2018-10-01 DIAGNOSIS — M75112 Incomplete rotator cuff tear or rupture of left shoulder, not specified as traumatic: Secondary | ICD-10-CM | POA: Diagnosis not present

## 2018-10-05 ENCOUNTER — Other Ambulatory Visit: Payer: Self-pay

## 2018-10-05 DIAGNOSIS — N952 Postmenopausal atrophic vaginitis: Secondary | ICD-10-CM

## 2018-10-05 MED ORDER — ESTROGENS, CONJUGATED 0.625 MG/GM VA CREA: TOPICAL_CREAM | VAGINAL | 0 refills | Status: DC

## 2018-10-05 NOTE — Telephone Encounter (Signed)
Pt is out of her vaginal cream.  Needs refill before appt on 11/04/18.  [phone]  Pt aware premarin cream eRx'd.

## 2018-10-07 ENCOUNTER — Encounter: Payer: Self-pay | Admitting: Physical Medicine & Rehabilitation

## 2018-10-07 ENCOUNTER — Encounter: Payer: 59 | Attending: Physical Medicine & Rehabilitation | Admitting: Physical Medicine & Rehabilitation

## 2018-10-07 VITALS — BP 100/67 | HR 80 | Ht 60.0 in | Wt 121.0 lb

## 2018-10-07 DIAGNOSIS — G890 Central pain syndrome: Secondary | ICD-10-CM | POA: Diagnosis present

## 2018-10-07 DIAGNOSIS — F418 Other specified anxiety disorders: Secondary | ICD-10-CM | POA: Diagnosis present

## 2018-10-07 DIAGNOSIS — Z79899 Other long term (current) drug therapy: Secondary | ICD-10-CM | POA: Diagnosis not present

## 2018-10-07 DIAGNOSIS — M5412 Radiculopathy, cervical region: Secondary | ICD-10-CM | POA: Diagnosis present

## 2018-10-07 DIAGNOSIS — M47812 Spondylosis without myelopathy or radiculopathy, cervical region: Secondary | ICD-10-CM | POA: Diagnosis not present

## 2018-10-07 DIAGNOSIS — M609 Myositis, unspecified: Secondary | ICD-10-CM | POA: Insufficient documentation

## 2018-10-07 DIAGNOSIS — M47817 Spondylosis without myelopathy or radiculopathy, lumbosacral region: Secondary | ICD-10-CM | POA: Diagnosis not present

## 2018-10-07 DIAGNOSIS — F341 Dysthymic disorder: Secondary | ICD-10-CM | POA: Insufficient documentation

## 2018-10-07 DIAGNOSIS — Z5181 Encounter for therapeutic drug level monitoring: Secondary | ICD-10-CM | POA: Diagnosis not present

## 2018-10-07 DIAGNOSIS — M961 Postlaminectomy syndrome, not elsewhere classified: Secondary | ICD-10-CM | POA: Diagnosis present

## 2018-10-07 MED ORDER — HYDROCODONE-ACETAMINOPHEN 10-325 MG PO TABS: 1.0000 | ORAL_TABLET | Freq: Four times a day (QID) | ORAL | 0 refills | Status: DC | PRN

## 2018-10-07 NOTE — Patient Instructions (Signed)
PLEASE FEEL FREE TO CALL OUR OFFICE WITH ANY PROBLEMS OR QUESTIONS ([phone])      

## 2018-10-07 NOTE — Progress Notes (Signed)
Subjective:    Patient ID: Kathryn Farrell, female    DOB: [birth year], 57 y.o.   MRN: [id number]  HPI   Kathryn Farrell is here in follow up of her chronic pain. She has had left shoulder pain related to a ? Rotator cuff tear. She recently had a steroid injection. She also fractured her left ribs when leaning over a crib. She is back on prolia for her osteoporosis.   She remains on hydrocodone which is beneficial for her pain.   Kathryn Farrell tries to remain as active as she can. She likes to spend time with her family and grandkids.     Pain Inventory Average Pain 7 Pain Right Now 7 My pain is sharp, burning, dull and stabbing  In the last 24 hours, has pain interfered with the following? General activity 6 Relation with others 5 Enjoyment of life 5 What TIME of day is your pain at its worst? morning Sleep (in general) Good  Pain is worse with: walking, bending and standing Pain improves with: heat/ice, medication and injections Relief from Meds: na  Mobility walk without assistance ability to climb steps?  no do you drive?  yes  Function disabled: date disabled .  Neuro/Psych weakness  Prior Studies Any changes since last visit?  no  Physicians involved in your care Any changes since last visit?  no   Family History  Problem Relation Age of Onset  . COPD Mother   . Asthma Mother   . Diabetes Brother   . Stroke Brother    Social History   Socioeconomic History  . Marital status: Married    Spouse name: Not on file  . Number of children: Not on file  . Years of education: Not on file  . Highest education level: Not on file  Occupational History  . Not on file  Social Needs  . Financial resource strain: Not on file  . Food insecurity:    Worry: Not on file    Inability: Not on file  . Transportation needs:    Medical: Not on file    Non-medical: Not on file  Tobacco Use  . Smoking status: Former Smoker    Last attempt to quit: 10/28/1996    Years since  quitting: 21.9  . Smokeless tobacco: Never Used  Substance and Sexual Activity  . Alcohol use: No  . Drug use: No  . Sexual activity: Yes  Lifestyle  . Physical activity:    Days per week: Not on file    Minutes per session: Not on file  . Stress: Not on file  Relationships  . Social connections:    Talks on phone: Not on file    Gets together: Not on file    Attends religious service: Not on file    Active member of club or organization: Not on file    Attends meetings of clubs or organizations: Not on file    Relationship status: Not on file  Other Topics Concern  . Not on file  Social History Narrative  . Not on file   Past Surgical History:  Procedure Laterality Date  . ABDOMINAL HYSTERECTOMY  1989  . arm surgery     "muscle came off the elbow"  . CESAREAN SECTION    . CHOLECYSTECTOMY    . COLONOSCOPY  2008  . KNEE ARTHROSCOPY  2004/2008   x2  . NECK SURGERY    . SPINE SURGERY  2008   cervical fusion  . UPPER GI  ENDOSCOPY  04-27-13   Dr Tracey HarriesBlazer   Past Medical History:  Diagnosis Date  . C. difficile colitis 2008  . Chronic cholecystitis 05/07/2013  . Colon polyps 2008  . GERD (gastroesophageal reflux disease)   . Hiatal hernia 2014  . Neuromuscular disorder (HCC) 2009   centralized pain syndrome/fibromylagia  . Osteoporosis    BP 100/67   Pulse 80   Ht 5' (1.524 m)   Wt 121 lb (54.9 kg)   SpO2 96%   BMI 23.63 kg/m   Opioid Risk Score:   Fall Risk Score:  `1  Depression screen PHQ 2/9  [facility] 09/11/2016 06/24/2016 04/24/2016 01/03/2016 12/18/2015 07/18/2015 01/17/2015  Decreased Interest 1 1 0 3 3 3 3   Down, Depressed, Hopeless 1 1 0 2 2 2 3   PHQ - 2 Score 2 2 0 5 5 5 6   Altered sleeping - - - - - - 3  Tired, decreased energy - - - - - - 3  Change in appetite - - - - - - 3  Feeling bad or failure about yourself  - - - - - - 3  Trouble concentrating - - - - - - 3  Moving slowly or fidgety/restless - - - - - - 3  Suicidal thoughts - -  - - - - 0  PHQ-9 Score - - - - - - 24     Review of Systems  Constitutional: Negative.   HENT: Negative.   Eyes: Negative.   Respiratory: Negative.   Cardiovascular: Negative.   Gastrointestinal: Negative.   Endocrine: Negative.   Genitourinary: Negative.   Musculoskeletal: Positive for arthralgias and myalgias.  Skin: Negative.   Allergic/Immunologic: Negative.   Neurological: Positive for weakness.  Hematological: Negative.   Psychiatric/Behavioral: Negative.   All other systems reviewed and are negative.      Objective:   Physical Exam General: No acute distress HEENT: EOMI, oral membranes moist Cards: reg rate  Chest: normal effort Abdomen: Soft, NT, ND Skin: dry, intact Extremities: no edema   Neurological: CN intact. Motor and sensory exam within normal limits Musc: posture fair. Tender along shoulders and neck Psych: pleasant  Skin: intact   Assessment & Plan:  ASSESSMENT:  1. Centralized pain syndrome/ FMS  2. Lumbar spondylosis. With facet and disc disease. Left L5 radiculopathy (improved)  3. Bilateral greater trochanter bursitis.  4. Osteoarthritis, right knee and left knee. S/p scope right knee 10/26/13  5. Right forearm extensor mechanism injury.  6. 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 ?left C7 radiculitis ---scar tissue--no radiographic evidence of compromise---motor/DTR's stable on exam also 7. Insomnia  8. Depression.  9. Left shoulder pain, likely RTC/labaral injury with bursitis.  10. Right sacroiliac pain responsive to injection for in past. 11. Bilateral TMJ pain, left more than right   PLAN:  1. Continue with HEP. Discussed other types of aerobic exercise which may be less impactful on hips. 2. Hip mgt per ortho.  3. Hydrocodone for breakthrough pain. 10/325 #120 -Medication was refilled and a second prescription was sent to the patient's pharmacy for next month.   -We will continue the  controlled substance monitoring program, this consists of regular clinic visits, examinations, routine drug screening, pill counts as well as use of [place] Cont[place]. [place].   5. continue celexa at current dosing.  6. Continue trazodone to 100mg . Sleepis generally better.  7.  10minutes of face  to face patient care time were spent during this visit. All questions were encouraged and answered. Follow up in 2 months with NP.

## 2018-10-12 LAB — TOXASSURE SELECT,+ANTIDEPR,UR

## 2018-10-13 ENCOUNTER — Telehealth: Payer: Self-pay | Admitting: *Deleted

## 2018-10-13 NOTE — Telephone Encounter (Signed)
Urine drug screen for this encounter is consistent for prescribed medication 

## 2018-11-04 ENCOUNTER — Ambulatory Visit (INDEPENDENT_AMBULATORY_CARE_PROVIDER_SITE_OTHER): Payer: 59 | Admitting: Obstetrics and Gynecology

## 2018-11-04 ENCOUNTER — Encounter: Payer: Self-pay | Admitting: Obstetrics and Gynecology

## 2018-11-04 VITALS — BP 100/68 | HR 68 | Ht 60.0 in | Wt 122.0 lb

## 2018-11-04 DIAGNOSIS — Z01419 Encounter for gynecological examination (general) (routine) without abnormal findings: Secondary | ICD-10-CM

## 2018-11-04 DIAGNOSIS — Z1239 Encounter for other screening for malignant neoplasm of breast: Secondary | ICD-10-CM

## 2018-11-04 DIAGNOSIS — N952 Postmenopausal atrophic vaginitis: Secondary | ICD-10-CM

## 2018-11-04 MED ORDER — ESTROGENS, CONJUGATED 0.625 MG/GM VA CREA: TOPICAL_CREAM | VAGINAL | 1 refills | Status: DC

## 2018-11-04 NOTE — Patient Instructions (Signed)
I value your feedback and entrusting us with your care. If you get a [facility] patient survey, I would appreciate you taking the time to let us know about your experience today. Thank you!  [facility]: [phone]  [facility]: [phone]  

## 2018-11-04 NOTE — Progress Notes (Signed)
Chief Complaint  Patient presents with  . Gynecologic Exam     HPI:      [name] is a 58 y.o. G2P2 who LMP was No LMP recorded. Patient has had a hysterectomy., presents today for her annual examination.  Her menses are absent due to [facility]. She does not have intermenstrual bleeding.  She does not have vasomotor sx.   Sex activity: single partner, contraception - status post hysterectomy. She does have vaginal dryness.She uses premarin vag crm with sx relief. Wants to cont Rx and needs RF.  Last Pap: 01/22/17 Results were: no abnormalities /neg HPV DNA. Plain pap 7/16.  Last mammogram: May 02, 2015  Results were: normal--routine follow-up in 12 months There is no FH of breast cancer. There is no FH of ovarian cancer. The patient does do self-breast exams.  Colonoscopy: currently managed by Dr. Mechele Collin DEXA: hx of osteoporosis due to early menopause with hyst. Does calcium/vit D/exercise and is getting injections twice yearly.  Tobacco use: The patient denies current or previous tobacco use. Alcohol use: none Exercise: moderately active  She does get adequate calcium and Vitamin D in her diet.  Labs with PCP.   Past Medical History:  Diagnosis Date  . Arthritis    seronegative inflammatory arthritis/cervical disc disease  . C. difficile colitis 2008  . Chronic cholecystitis 05/07/2013  . Colon polyps 2008  . GERD (gastroesophageal reflux disease)   . Hiatal hernia 2014  . Hypercholesterolemia   . Mild depression (HCC)   . Neuromuscular disorder (HCC) 2009   centralized pain syndrome/fibromylagia  . Osteoporosis     Past Surgical History:  Procedure Laterality Date  . arm surgery     "muscle came off the elbow"  . CESAREAN SECTION    . CHOLECYSTECTOMY    . COLONOSCOPY  2008  . KNEE ARTHROSCOPY  2004/2008   x2  . NECK SURGERY    . SPINE SURGERY  2008   cervical fusion  . TOTAL ABDOMINAL HYSTERECTOMY W/ BILATERAL SALPINGOOPHORECTOMY  1989  . UPPER GI  ENDOSCOPY  04-27-13   Dr [name]    Family History  Problem Relation Age of Onset  . COPD Mother   . Asthma Mother   . Diabetes Brother   . Stroke Brother   . Hypertension Brother      ROS:  Review of Systems  Constitutional: Negative for fever, malaise/fatigue and weight loss.  HENT: Negative for congestion, ear pain and sinus pain.   Respiratory: Negative for cough, shortness of breath and wheezing.   Cardiovascular: Negative for chest pain, orthopnea and leg swelling.  Gastrointestinal: Negative for constipation, diarrhea, nausea and vomiting.  Genitourinary: Negative for dysuria, frequency, hematuria and urgency.       Breast ROS: negative   Musculoskeletal: Negative for back pain, joint pain and myalgias.  Skin: Negative for itching and rash.  Neurological: Negative for dizziness, tingling, focal weakness and headaches.  Endo/Heme/Allergies: Negative for environmental allergies. Does not bruise/bleed easily.  Psychiatric/Behavioral: Negative for depression and suicidal ideas. The patient is not nervous/anxious and does not have insomnia.     Objective: BP 100/68   Pulse 68   Ht 5' (1.524 m)   Wt 122 lb (55.3 kg)   BMI 23.83 kg/m    Physical Exam Constitutional:      Appearance: She is well-developed.  Genitourinary:     Vulva and vagina normal.     No vaginal discharge, erythema or tenderness.  Cervix is absent.     Uterus is not tender.     Uterus is absent.     No right or left adnexal mass present.     Right adnexa not tender.     Left adnexa not tender.     Genitourinary Comments: UTERUS/CX SURG REM  Neck:     Musculoskeletal: Normal range of motion.     Thyroid: No thyromegaly.  Cardiovascular:     Rate and Rhythm: Normal rate and regular rhythm.     Heart sounds: Normal heart sounds. No murmur.  Pulmonary:     Effort: Pulmonary effort is normal.     Breath sounds: Normal breath sounds.  Chest:     Breasts:        Right: No mass, nipple  discharge, skin change or tenderness.        Left: No mass, nipple discharge, skin change or tenderness.  Abdominal:     Palpations: Abdomen is soft.     Tenderness: There is no abdominal tenderness. There is no guarding.  Musculoskeletal: Normal range of motion.  Neurological:     Mental Status: She is alert and oriented to person, place, and time.     Cranial Nerves: No cranial nerve deficit.  Psychiatric:        Behavior: Behavior normal.  Vitals signs reviewed.    Assessment/Plan:  Encounter for annual routine gynecological examination  Screening for breast cancer - Pt to sched mammo. - Plan: MM 3D SCREEN BREAST BILATERAL  Vaginal atrophy - Rx RF prem vag crm. Coupon card. F/u prn.  - Plan: conjugated estrogens (PREMARIN) vaginal cream          GYN counsel mammography screening, menopause, adequate intake of calcium and vitamin D     F/U  Return in about 1 year (around 11/05/2019).  Areli Frary B. Edyth Glomb, PA-C 11/04/2018 2:24 PM

## 2018-11-05 ENCOUNTER — Telehealth: Payer: Self-pay | Admitting: *Deleted

## 2018-11-05 DIAGNOSIS — M47812 Spondylosis without myelopathy or radiculopathy, cervical region: Secondary | ICD-10-CM

## 2018-11-05 DIAGNOSIS — M47817 Spondylosis without myelopathy or radiculopathy, lumbosacral region: Secondary | ICD-10-CM

## 2018-11-05 NOTE — Telephone Encounter (Signed)
Kathryn Farrell called and needs her Rx to ve changed. Dr Riley Kill wrote do not fill before 11/07/18.  Her last fill was 10/07/18 and day 30 of the rx is today and they will not refill yet.  Can you correct this?

## 2018-11-06 MED ORDER — HYDROCODONE-ACETAMINOPHEN 10-325 MG PO TABS: 1.0000 | ORAL_TABLET | Freq: Four times a day (QID) | ORAL | 0 refills | Status: DC | PRN

## 2018-11-06 NOTE — Telephone Encounter (Signed)
done

## 2018-11-13 DIAGNOSIS — M19012 Primary osteoarthritis, left shoulder: Secondary | ICD-10-CM | POA: Diagnosis not present

## 2018-12-03 ENCOUNTER — Telehealth: Payer: Self-pay

## 2018-12-03 DIAGNOSIS — M47817 Spondylosis without myelopathy or radiculopathy, lumbosacral region: Secondary | ICD-10-CM

## 2018-12-03 DIAGNOSIS — M47812 Spondylosis without myelopathy or radiculopathy, cervical region: Secondary | ICD-10-CM

## 2018-12-03 MED ORDER — HYDROCODONE-ACETAMINOPHEN 10-325 MG PO TABS: 1.0000 | ORAL_TABLET | Freq: Four times a day (QID) | ORAL | 0 refills | Status: DC | PRN

## 2018-12-03 NOTE — Telephone Encounter (Signed)
Return [name] call, PMP Aware Reviewed Hydrocodone e-scribe. Kathryn Farrell is aware, she verbalizes understanding.

## 2018-12-03 NOTE — Telephone Encounter (Signed)
Pt called stating she will be out of her Hydrocodone/APAP before her next appt. Last filled 11/06/2018 and next appt 12/08/2018. She states she can't go without medication because she was told she has to have a shoulder replacement.

## 2018-12-07 DIAGNOSIS — J40 Bronchitis, not specified as acute or chronic: Secondary | ICD-10-CM | POA: Diagnosis not present

## 2018-12-08 ENCOUNTER — Encounter: Payer: 59 | Admitting: Registered Nurse

## 2018-12-23 ENCOUNTER — Encounter: Payer: 59 | Attending: Physical Medicine & Rehabilitation | Admitting: Registered Nurse

## 2018-12-23 ENCOUNTER — Encounter: Payer: Self-pay | Admitting: Registered Nurse

## 2018-12-23 VITALS — BP 90/64 | HR 78 | Ht 60.0 in | Wt 119.2 lb

## 2018-12-23 DIAGNOSIS — F341 Dysthymic disorder: Secondary | ICD-10-CM | POA: Diagnosis present

## 2018-12-23 DIAGNOSIS — M62838 Other muscle spasm: Secondary | ICD-10-CM

## 2018-12-23 DIAGNOSIS — M609 Myositis, unspecified: Secondary | ICD-10-CM | POA: Insufficient documentation

## 2018-12-23 DIAGNOSIS — M797 Fibromyalgia: Secondary | ICD-10-CM

## 2018-12-23 DIAGNOSIS — M542 Cervicalgia: Secondary | ICD-10-CM

## 2018-12-23 DIAGNOSIS — M5412 Radiculopathy, cervical region: Secondary | ICD-10-CM | POA: Diagnosis present

## 2018-12-23 DIAGNOSIS — M47817 Spondylosis without myelopathy or radiculopathy, lumbosacral region: Secondary | ICD-10-CM

## 2018-12-23 DIAGNOSIS — M47812 Spondylosis without myelopathy or radiculopathy, cervical region: Secondary | ICD-10-CM | POA: Diagnosis not present

## 2018-12-23 DIAGNOSIS — F418 Other specified anxiety disorders: Secondary | ICD-10-CM | POA: Insufficient documentation

## 2018-12-23 DIAGNOSIS — Z5181 Encounter for therapeutic drug level monitoring: Secondary | ICD-10-CM

## 2018-12-23 DIAGNOSIS — G894 Chronic pain syndrome: Secondary | ICD-10-CM

## 2018-12-23 DIAGNOSIS — G890 Central pain syndrome: Secondary | ICD-10-CM | POA: Insufficient documentation

## 2018-12-23 DIAGNOSIS — Z79899 Other long term (current) drug therapy: Secondary | ICD-10-CM

## 2018-12-23 DIAGNOSIS — M961 Postlaminectomy syndrome, not elsewhere classified: Secondary | ICD-10-CM

## 2018-12-23 DIAGNOSIS — M7061 Trochanteric bursitis, right hip: Secondary | ICD-10-CM | POA: Diagnosis not present

## 2018-12-23 DIAGNOSIS — M2550 Pain in unspecified joint: Secondary | ICD-10-CM

## 2018-12-23 MED ORDER — HYDROCODONE-ACETAMINOPHEN 10-325 MG PO TABS: 1.0000 | ORAL_TABLET | Freq: Four times a day (QID) | ORAL | 0 refills | Status: DC | PRN
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
## 2019-02-24 ENCOUNTER — Encounter: Payer: 59 | Attending: Physical Medicine & Rehabilitation | Admitting: Registered Nurse

## 2019-02-24 ENCOUNTER — Encounter: Payer: Self-pay | Admitting: Registered Nurse

## 2019-02-24 ENCOUNTER — Other Ambulatory Visit: Payer: Self-pay

## 2019-02-24 VITALS — Ht 60.0 in | Wt 119.0 lb

## 2019-02-24 DIAGNOSIS — F418 Other specified anxiety disorders: Secondary | ICD-10-CM

## 2019-02-24 DIAGNOSIS — Z5181 Encounter for therapeutic drug level monitoring: Secondary | ICD-10-CM

## 2019-02-24 DIAGNOSIS — Z79899 Other long term (current) drug therapy: Secondary | ICD-10-CM

## 2019-02-24 DIAGNOSIS — M546 Pain in thoracic spine: Secondary | ICD-10-CM

## 2019-02-24 DIAGNOSIS — M797 Fibromyalgia: Secondary | ICD-10-CM

## 2019-02-24 DIAGNOSIS — M5412 Radiculopathy, cervical region: Secondary | ICD-10-CM | POA: Insufficient documentation

## 2019-02-24 DIAGNOSIS — M7061 Trochanteric bursitis, right hip: Secondary | ICD-10-CM

## 2019-02-24 DIAGNOSIS — M47817 Spondylosis without myelopathy or radiculopathy, lumbosacral region: Secondary | ICD-10-CM

## 2019-02-24 DIAGNOSIS — M170 Bilateral primary osteoarthritis of knee: Secondary | ICD-10-CM

## 2019-02-24 DIAGNOSIS — M609 Myositis, unspecified: Secondary | ICD-10-CM | POA: Insufficient documentation

## 2019-02-24 DIAGNOSIS — M542 Cervicalgia: Secondary | ICD-10-CM | POA: Diagnosis not present

## 2019-02-24 DIAGNOSIS — G890 Central pain syndrome: Secondary | ICD-10-CM | POA: Insufficient documentation

## 2019-02-24 DIAGNOSIS — G894 Chronic pain syndrome: Secondary | ICD-10-CM

## 2019-02-24 DIAGNOSIS — M62838 Other muscle spasm: Secondary | ICD-10-CM

## 2019-02-24 DIAGNOSIS — G8929 Other chronic pain: Secondary | ICD-10-CM

## 2019-02-24 DIAGNOSIS — M47812 Spondylosis without myelopathy or radiculopathy, cervical region: Secondary | ICD-10-CM | POA: Diagnosis not present

## 2019-02-24 DIAGNOSIS — M19012 Primary osteoarthritis, left shoulder: Secondary | ICD-10-CM

## 2019-02-24 DIAGNOSIS — M2550 Pain in unspecified joint: Secondary | ICD-10-CM

## 2019-02-24 DIAGNOSIS — M7062 Trochanteric bursitis, left hip: Secondary | ICD-10-CM

## 2019-02-24 DIAGNOSIS — F341 Dysthymic disorder: Secondary | ICD-10-CM | POA: Insufficient documentation

## 2019-02-24 DIAGNOSIS — M961 Postlaminectomy syndrome, not elsewhere classified: Secondary | ICD-10-CM

## 2019-02-24 MED ORDER — HYDROCODONE-ACETAMINOPHEN 10-325 MG PO TABS: 1.0000 | ORAL_TABLET | Freq: Four times a day (QID) | ORAL | 0 refills | Status: DC | PRN
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
## 2019-03-23 DIAGNOSIS — M19012 Primary osteoarthritis, left shoulder: Secondary | ICD-10-CM | POA: Diagnosis not present

## 2019-03-23 DIAGNOSIS — M7502 Adhesive capsulitis of left shoulder: Secondary | ICD-10-CM | POA: Diagnosis not present

## 2019-03-23 DIAGNOSIS — M75122 Complete rotator cuff tear or rupture of left shoulder, not specified as traumatic: Secondary | ICD-10-CM | POA: Diagnosis not present

## 2019-04-21 ENCOUNTER — Ambulatory Visit: Payer: 59 | Admitting: Physical Medicine & Rehabilitation

## 2019-04-22 ENCOUNTER — Ambulatory Visit: Payer: 59 | Admitting: Registered Nurse

## 2019-04-26 ENCOUNTER — Encounter: Payer: 59 | Attending: Registered Nurse | Admitting: Registered Nurse

## 2019-04-26 ENCOUNTER — Other Ambulatory Visit: Payer: Self-pay

## 2019-04-26 ENCOUNTER — Encounter: Payer: Self-pay | Admitting: Registered Nurse

## 2019-04-26 VITALS — BP 99/63 | HR 59 | Temp 97.7°F | Ht 59.5 in | Wt 118.0 lb

## 2019-04-26 DIAGNOSIS — M47812 Spondylosis without myelopathy or radiculopathy, cervical region: Secondary | ICD-10-CM | POA: Diagnosis present

## 2019-04-26 DIAGNOSIS — M609 Myositis, unspecified: Secondary | ICD-10-CM | POA: Diagnosis present

## 2019-04-26 DIAGNOSIS — M961 Postlaminectomy syndrome, not elsewhere classified: Secondary | ICD-10-CM | POA: Insufficient documentation

## 2019-04-26 DIAGNOSIS — Z79899 Other long term (current) drug therapy: Secondary | ICD-10-CM | POA: Diagnosis not present

## 2019-04-26 DIAGNOSIS — M62838 Other muscle spasm: Secondary | ICD-10-CM

## 2019-04-26 DIAGNOSIS — M7062 Trochanteric bursitis, left hip: Secondary | ICD-10-CM

## 2019-04-26 DIAGNOSIS — M5412 Radiculopathy, cervical region: Secondary | ICD-10-CM | POA: Insufficient documentation

## 2019-04-26 DIAGNOSIS — M25511 Pain in right shoulder: Secondary | ICD-10-CM

## 2019-04-26 DIAGNOSIS — M47817 Spondylosis without myelopathy or radiculopathy, lumbosacral region: Secondary | ICD-10-CM | POA: Insufficient documentation

## 2019-04-26 DIAGNOSIS — G890 Central pain syndrome: Secondary | ICD-10-CM | POA: Diagnosis present

## 2019-04-26 DIAGNOSIS — G8929 Other chronic pain: Secondary | ICD-10-CM

## 2019-04-26 DIAGNOSIS — M1711 Unilateral primary osteoarthritis, right knee: Secondary | ICD-10-CM

## 2019-04-26 DIAGNOSIS — Z5181 Encounter for therapeutic drug level monitoring: Secondary | ICD-10-CM

## 2019-04-26 DIAGNOSIS — F341 Dysthymic disorder: Secondary | ICD-10-CM | POA: Diagnosis present

## 2019-04-26 DIAGNOSIS — M25512 Pain in left shoulder: Secondary | ICD-10-CM

## 2019-04-26 DIAGNOSIS — M797 Fibromyalgia: Secondary | ICD-10-CM

## 2019-04-26 DIAGNOSIS — M7061 Trochanteric bursitis, right hip: Secondary | ICD-10-CM

## 2019-04-26 DIAGNOSIS — F418 Other specified anxiety disorders: Secondary | ICD-10-CM | POA: Insufficient documentation

## 2019-04-26 DIAGNOSIS — M1712 Unilateral primary osteoarthritis, left knee: Secondary | ICD-10-CM

## 2019-04-26 MED ORDER — HYDROCODONE-ACETAMINOPHEN 10-325 MG PO TABS: 1.0000 | ORAL_TABLET | Freq: Four times a day (QID) | ORAL | 0 refills | Status: DC | PRN
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
## 2019-04-30 LAB — DRUG TOX MONITOR 1 W/CONF, ORAL FLD
Alprazolam: 1.19 ng/mL — ABNORMAL HIGH (ref <0.50)
Amphetamines: NEGATIVE ng/mL (ref <10)
Barbiturates: NEGATIVE ng/mL (ref <10)
Benzodiazepines: POSITIVE ng/mL — AB (ref <0.50)
Buprenorphine: NEGATIVE ng/mL (ref <0.10)
Chlordiazepoxide: NEGATIVE ng/mL (ref <0.50)
Clonazepam: NEGATIVE ng/mL (ref <0.50)
Cocaine: NEGATIVE ng/mL (ref <5.0)
Codeine: NEGATIVE ng/mL (ref <2.5)
Diazepam: NEGATIVE ng/mL (ref <0.50)
Dihydrocodeine: 8.5 ng/mL — ABNORMAL HIGH (ref <2.5)
Fentanyl: NEGATIVE ng/mL (ref <0.10)
Flunitrazepam: NEGATIVE ng/mL (ref <0.50)
Flurazepam: NEGATIVE ng/mL (ref <0.50)
Heroin Metabolite: NEGATIVE ng/mL (ref <1.0)
Hydrocodone: 175.4 ng/mL — ABNORMAL HIGH (ref <2.5)
Hydromorphone: NEGATIVE ng/mL (ref <2.5)
Lorazepam: NEGATIVE ng/mL (ref <0.50)
MARIJUANA: NEGATIVE ng/mL (ref <2.5)
MDMA: NEGATIVE ng/mL (ref <10)
Meprobamate: NEGATIVE ng/mL (ref <2.5)
Methadone: NEGATIVE ng/mL (ref <5.0)
Midazolam: NEGATIVE ng/mL (ref <0.50)
Morphine: NEGATIVE ng/mL (ref <2.5)
Nicotine Metabolite: NEGATIVE ng/mL (ref <5.0)
Nordiazepam: NEGATIVE ng/mL (ref <0.50)
Norhydrocodone: 7.4 ng/mL — ABNORMAL HIGH (ref <2.5)
Noroxycodone: NEGATIVE ng/mL (ref <2.5)
Opiates: POSITIVE ng/mL — AB (ref <2.5)
Oxazepam: NEGATIVE ng/mL (ref <0.50)
Oxycodone: NEGATIVE ng/mL (ref <2.5)
Oxymorphone: NEGATIVE ng/mL (ref <2.5)
Phencyclidine: NEGATIVE ng/mL (ref <10)
Tapentadol: NEGATIVE ng/mL (ref <5.0)
Temazepam: NEGATIVE ng/mL (ref <0.50)
Tramadol: NEGATIVE ng/mL (ref <5.0)
Triazolam: NEGATIVE ng/mL (ref <0.50)
Zolpidem: NEGATIVE ng/mL (ref <5.0)

## 2019-04-30 LAB — DRUG TOX ALC METAB W/CON, ORAL FLD: Alcohol Metabolite: NEGATIVE ng/mL (ref <25)

## 2019-05-04 ENCOUNTER — Telehealth: Payer: Self-pay | Admitting: *Deleted

## 2019-05-04 NOTE — Telephone Encounter (Signed)
Oral swab drug screen was consistent for prescribed medications.  ?

## 2019-05-11 ENCOUNTER — Telehealth: Payer: Self-pay | Admitting: Registered Nurse

## 2019-05-11 ENCOUNTER — Encounter: Payer: 59 | Attending: Registered Nurse | Admitting: Physical Medicine & Rehabilitation

## 2019-05-11 ENCOUNTER — Other Ambulatory Visit: Payer: Self-pay

## 2019-05-11 ENCOUNTER — Encounter: Payer: Self-pay | Admitting: Physical Medicine & Rehabilitation

## 2019-05-11 VITALS — BP 106/69 | HR 71 | Temp 97.3°F | Ht 60.0 in | Wt 120.0 lb

## 2019-05-11 DIAGNOSIS — Z5181 Encounter for therapeutic drug level monitoring: Secondary | ICD-10-CM | POA: Insufficient documentation

## 2019-05-11 DIAGNOSIS — M961 Postlaminectomy syndrome, not elsewhere classified: Secondary | ICD-10-CM | POA: Diagnosis present

## 2019-05-11 DIAGNOSIS — M533 Sacrococcygeal disorders, not elsewhere classified: Secondary | ICD-10-CM

## 2019-05-11 DIAGNOSIS — M609 Myositis, unspecified: Secondary | ICD-10-CM | POA: Diagnosis not present

## 2019-05-11 DIAGNOSIS — M47817 Spondylosis without myelopathy or radiculopathy, lumbosacral region: Secondary | ICD-10-CM | POA: Diagnosis present

## 2019-05-11 DIAGNOSIS — F341 Dysthymic disorder: Secondary | ICD-10-CM | POA: Diagnosis present

## 2019-05-11 DIAGNOSIS — M5412 Radiculopathy, cervical region: Secondary | ICD-10-CM | POA: Diagnosis present

## 2019-05-11 DIAGNOSIS — G8929 Other chronic pain: Secondary | ICD-10-CM

## 2019-05-11 DIAGNOSIS — F418 Other specified anxiety disorders: Secondary | ICD-10-CM | POA: Insufficient documentation

## 2019-05-11 DIAGNOSIS — G890 Central pain syndrome: Secondary | ICD-10-CM | POA: Diagnosis present

## 2019-05-11 DIAGNOSIS — M47812 Spondylosis without myelopathy or radiculopathy, cervical region: Secondary | ICD-10-CM | POA: Diagnosis present

## 2019-05-11 NOTE — Telephone Encounter (Signed)
I spoke with Judeen Hammans and she is ok with that. Please make her an appointment with Dr Posey Pronto to evaluate her pain for which injection, or if Kirsteins has something available right away. Kirsteins can see her today. She will be here for that appointment.

## 2019-05-11 NOTE — Progress Notes (Signed)
Subjective:    Patient ID: Kathryn Farrell, female    DOB: 07-05-1961, 58 y.o.   MRN: [id number]  HPI CC:  Midline low back pain Here for an evaluation of pain after requesting a back injection. It has been three years and Kathryn Farrell asked to evaluate.  Pain feels like it is in the middle. Pt has been lifting 22lb toddler  while Kathryn Farrell but no falls or other trauma to the area Does better with firm mattress No pain radiating into the legs   Right sacroiliac injection 12/28/15 with good relief  Also has chronic hip/groin pain but current pain more intense and in middle of low back area  Pain Inventory Average Pain 8 Pain Right Now 8 My pain is burning, stabbing and aching  In the last 24 hours, has pain interfered with the following? General activity 7 Relation with others 7 Enjoyment of life 7 What TIME of day is your pain at its worst? all Sleep (in general) all  Pain is worse with: walking, bending, Farrell and standing Pain improves with: heat/ice, medication and injections Relief from Meds: 6  Mobility walk without assistance  Function disabled: date disabled .  Neuro/Psych trouble walking  Prior Studies Any changes since last visit?  yes  Last injection around 3 years ago.  Pain is increasing.  Physicians involved in your care Any changes since last visit?  no   Family History  Problem Relation Age of Onset  . COPD Mother   . Asthma Mother   . Diabetes Brother   . Stroke Brother   . Hypertension Brother    Social History   Socioeconomic History  . Marital status: Married    Spouse name: Not on file  . Number of children: Not on file  . Years of education: Not on file  . Highest education level: Not on file  Occupational History  . Not on file  Social Needs  . Financial resource strain: Not on file  . Food insecurity    Worry: Not on file    Inability: Not on file  . Transportation needs    Medical: Not on file    Non-medical: Not on file   Tobacco Use  . Smoking status: Former Smoker    Quit date: 10/28/1996    Years since quitting: 22.5  . Smokeless tobacco: Never Used  Substance and Sexual Activity  . Alcohol use: No  . Drug use: No  . Sexual activity: Yes    Birth control/protection: Surgical    Comment: Hysterectomy  Lifestyle  . Physical activity    Days per week: Not on file    Minutes per session: Not on file  . Stress: Not on file  Relationships  . Social Herbalist on phone: Not on file    Gets together: Not on file    Attends religious service: Not on file    Active member of club or organization: Not on file    Attends meetings of clubs or organizations: Not on file    Relationship status: Not on file  Other Topics Concern  . Not on file  Social History Narrative  . Not on file   Past Surgical History:  Procedure Laterality Date  . arm surgery     "muscle came off the elbow"  . CESAREAN SECTION    . CHOLECYSTECTOMY    . COLONOSCOPY  2008  . KNEE ARTHROSCOPY  2004/2008   x2  . NECK SURGERY    .  SPINE SURGERY  2008   cervical fusion  . TOTAL ABDOMINAL HYSTERECTOMY W/ BILATERAL SALPINGOOPHORECTOMY  1989  . UPPER GI ENDOSCOPY  04-27-13   Dr Tracey HarriesBlazer   Past Medical History:  Diagnosis Date  . Arthritis    seronegative inflammatory arthritis/cervical disc disease  . C. difficile colitis 2008  . Chronic cholecystitis 05/07/2013  . Colon polyps 2008  . GERD (gastroesophageal reflux disease)   . Hiatal hernia 2014  . Hypercholesterolemia   . Mild depression (HCC)   . Neuromuscular disorder (HCC) 2009   centralized pain syndrome/fibromylagia  . Osteoporosis    BP 106/69   Pulse 71   Temp (!) 97.3 F (36.3 C)   Ht 5' (1.524 m)   Wt 120 lb (54.4 kg)   SpO2 95%   BMI 23.44 kg/m   Opioid Risk Score:   Fall Risk Score:  `1  Depression screen PHQ 2/9  [facility]/11[facility]2016 06/24/2016 04/24/2016 01/03/2016 12/18/2015 07/18/2015 01/17/2015  Decreased Interest 1 1 0 3 3 3  3   Down, Depressed, Hopeless 1 1 0 2 2 2 3   PHQ - 2 Score 2 2 0 5 5 5 6   Altered sleeping - - - - - - 3  Tired, decreased energy - - - - - - 3  Change in appetite - - - - - - 3  Feeling bad or failure about yourself  - - - - - - 3  Trouble concentrating - - - - - - 3  Moving slowly or fidgety/restless - - - - - - 3  Suicidal thoughts - - - - - - 0  PHQ-9 Score - - - - - - 24   Review of Systems  Constitutional: Negative.   HENT: Negative.   Eyes: Negative.   Respiratory: Negative.   Cardiovascular: Negative.   Gastrointestinal: Negative.   Endocrine: Negative.   Genitourinary: Negative.   Musculoskeletal: Positive for arthralgias.  Skin: Negative.   Allergic/Immunologic: Negative.   Neurological: Negative.   Hematological: Negative.   All other systems reviewed and are negative.      Objective:   Physical Exam Vitals signs and nursing note reviewed.  Constitutional:      Appearance: Normal appearance.  HENT:     Head: Normocephalic and atraumatic.     Nose: Nose normal.  Musculoskeletal:     Comments: Patient has mild tenderness palpation over the lateral thighs.  There is also tenderness over the right greater left PSIS area. Faber's on the left produces groin pain and on the right produces right PSIS pain Hip internal rotation and external rotation bilaterally intact but causes pain in the groin.  Skin:    General: Skin is warm and dry.  Neurological:     Mental Status: She is alert and oriented to person, place, and time.     Sensory: No sensory deficit.     Motor: No weakness.     Gait: Gait normal.     Deep Tendon Reflexes: Reflexes abnormal.     Reflex Scores:      Patellar reflexes are 2+ on the right side and 2+ on the left side.      Achilles reflexes are 0 on the right side and 0 on the left side.    Comments: Motor strength is 5/5 bilateral hip flexors knee extensors ankle dorsiflexors.  Psychiatric:        Mood and Affect: Mood normal.   Tenderness  is below L4 in the lumbar  paraspinal area        Assessment & Plan:  1.  Midline low back pain.  This is primary complaint.  Her symptoms are below L4.  Reviewed MRI she does have lumbar spondylosis L4-5 L5-S1.  This is likely pain generator.  We discussed treatment options including physical therapy exercises as well as lumbar medial branch blocks.  She indicates that because of her babysitting she needs to get back to normal functioning as soon as possible.  Instructed pelvic lift exercises cat cow exercise. Set up for bilateral L4 medial branch and L5 dorsal ramus injection under fluoroscopic guidance.  [id number]  2.  Sacroiliac pain, this can be provoked with Faber's maneuver.  I do not think this is the primary cause of pain.  If above is not particularly helpful may consider repeat right sacroiliac injection under fluoroscopic guidance.

## 2019-05-11 NOTE — Telephone Encounter (Signed)
I would be happy to take a look but she will have to wait for a cancellation to see me. It's been 3 three years by my count since she's had an injection, so she needs to be checked out in person before referring her for injection. Another option would be to see Dr. Posey Pronto who could then refer her for appropriate injection.

## 2019-05-11 NOTE — Patient Instructions (Signed)
Will do lumbar medial branch blocks Back Exercises These exercises help to make your trunk and back strong. They also help to keep the lower back flexible. Doing these exercises can help to prevent back pain or lessen existing pain.  If you have back pain, try to do these exercises 2-3 times each day or as told by your doctor.  As you get better, do the exercises once each day. Repeat the exercises more often as told by your doctor.  To stop back pain from coming back, do the exercises once each day, or as told by your doctor. Exercises Single knee to chest Do these steps 3-5 times in a row for each leg: 1. Lie on your back on a firm bed or the floor with your legs stretched out. 2. Bring one knee to your chest. 3. Grab your knee or thigh with both hands and hold them it in place. 4. Pull on your knee until you feel a gentle stretch in your lower back or buttocks. 5. Keep doing the stretch for 10-30 seconds. 6. Slowly let go of your leg and straighten it. Pelvic tilt Do these steps 5-10 times in a row: 1. Lie on your back on a firm bed or the floor with your legs stretched out. 2. Bend your knees so they point up to the ceiling. Your feet should be flat on the floor. 3. Tighten your lower belly (abdomen) muscles to press your lower back against the floor. This will make your tailbone point up to the ceiling instead of pointing down to your feet or the floor. 4. Stay in this position for 5-10 seconds while you gently tighten your muscles and breathe evenly. Cat-cow Do these steps until your lower back bends more easily: 1. Get on your hands and knees on a firm surface. Keep your hands under your shoulders, and keep your knees under your hips. You may put padding under your knees. 2. Let your head hang down toward your chest. Tighten (contract) the muscles in your belly. Point your tailbone toward the floor so your lower back becomes rounded like the back of a cat. 3. Stay in this position  for 5 seconds. 4. Slowly lift your head. Let the muscles of your belly relax. Point your tailbone up toward the ceiling so your back forms a sagging arch like the back of a cow. 5. Stay in this position for 5 seconds.  Press-ups Do these steps 5-10 times in a row: 1. Lie on your belly (face-down) on the floor. 2. Place your hands near your head, about shoulder-width apart. 3. While you keep your back relaxed and keep your hips on the floor, slowly straighten your arms to raise the top half of your body and lift your shoulders. Do not use your back muscles. You may change where you place your hands in order to make yourself more comfortable. 4. Stay in this position for 5 seconds. 5. Slowly return to lying flat on the floor.  Bridges Do these steps 10 times in a row: 1. Lie on your back on a firm surface. 2. Bend your knees so they point up to the ceiling. Your feet should be flat on the floor. Your arms should be flat at your sides, next to your body. 3. Tighten your butt muscles and lift your butt off the floor until your waist is almost as high as your knees. If you do not feel the muscles working in your butt and the back of your  thighs, slide your feet 1-2 inches farther away from your butt. 4. Stay in this position for 3-5 seconds. 5. Slowly lower your butt to the floor, and let your butt muscles relax. If this exercise is too easy, try doing it with your arms crossed over your chest. Belly crunches Do these steps 5-10 times in a row: 1. Lie on your back on a firm bed or the floor with your legs stretched out. 2. Bend your knees so they point up to the ceiling. Your feet should be flat on the floor. 3. Cross your arms over your chest. 4. Tip your chin a little bit toward your chest but do not bend your neck. 5. Tighten your belly muscles and slowly raise your chest just enough to lift your shoulder blades a tiny bit off of the floor. Avoid raising your body higher than that, because it  can put too much stress on your low back. 6. Slowly lower your chest and your head to the floor. Back lifts Do these steps 5-10 times in a row: 1. Lie on your belly (face-down) with your arms at your sides, and rest your forehead on the floor. 2. Tighten the muscles in your legs and your butt. 3. Slowly lift your chest off of the floor while you keep your hips on the floor. Keep the back of your head in line with the curve in your back. Look at the floor while you do this. 4. Stay in this position for 3-5 seconds. 5. Slowly lower your chest and your face to the floor. Contact a doctor if:  Your back pain gets a lot worse when you do an exercise.  Your back pain does not get better 2 hours after you exercise. If you have any of these problems, stop doing the exercises. Do not do them again unless your doctor says it is okay. Get help right away if:  You have sudden, very bad back pain. If this happens, stop doing the exercises. Do not do them again unless your doctor says it is okay. This information is not intended to replace advice given to you by your health care provider. Make sure you discuss any questions you have with your health care provider. Document Released: 11/16/2010 Document Revised: 07/09/2018 Document Reviewed: 07/09/2018 Elsevier Patient Education  2020 [employer]

## 2019-05-11 NOTE — Telephone Encounter (Signed)
I just received a phone call from Kathryn Farrell, [place] 11/07/1960 and she stated that she needs an injection because her back is starting to hurt her really bad and was wondering if she could schedule an appt with Dr. Naaman Plummer

## 2019-05-21 ENCOUNTER — Encounter (HOSPITAL_BASED_OUTPATIENT_CLINIC_OR_DEPARTMENT_OTHER): Payer: 59 | Admitting: Physical Medicine & Rehabilitation

## 2019-05-21 ENCOUNTER — Other Ambulatory Visit: Payer: Self-pay

## 2019-05-21 ENCOUNTER — Encounter: Payer: Self-pay | Admitting: Physical Medicine & Rehabilitation

## 2019-05-21 DIAGNOSIS — M47816 Spondylosis without myelopathy or radiculopathy, lumbar region: Secondary | ICD-10-CM | POA: Diagnosis not present

## 2019-05-21 DIAGNOSIS — M47812 Spondylosis without myelopathy or radiculopathy, cervical region: Secondary | ICD-10-CM

## 2019-05-21 DIAGNOSIS — M47817 Spondylosis without myelopathy or radiculopathy, lumbosacral region: Secondary | ICD-10-CM

## 2019-05-21 DIAGNOSIS — M609 Myositis, unspecified: Secondary | ICD-10-CM | POA: Diagnosis not present

## 2019-05-21 MED ORDER — HYDROCODONE-ACETAMINOPHEN 10-325 MG PO TABS: 1.0000 | ORAL_TABLET | Freq: Four times a day (QID) | ORAL | 0 refills | Status: DC | PRN

## 2019-05-21 NOTE — Progress Notes (Signed)
Bilateral Lumbar L3, L4  medial branch blocks and L 5 dorsal ramus injection under fluoroscopic guidance  Indication: Lumbar pain which is not relieved by medication management or other conservative care and interfering with self-care and mobility.  Informed consent was obtained after describing risks and benefits of the procedure with the patient, this includes bleeding, infection, paralysis and medication side effects.  The patient wishes to proceed and has given written consent.  The patient was placed in prone position.  The lumbar area was marked and prepped with Betadine.  One mL of 1% lidocaine was injected into each of 6 areas into the skin and subcutaneous tissue.  Then a 22-gauge 3.5in spinal needle was inserted targeting the junction of the left S1 superior articular process and sacral ala junction. Needle was advanced under fluoroscopic guidance.  Bone contact was made.  Isovue 200 was injected x 0.5 mL demonstrating no intravascular uptake.  Then a solution  of 2% MPF lidocaine was injected x 0.5 mL.  Then the left L5 superior articular process in transverse process junction was targeted.  Bone contact was made.  Isovue 200 was injected x 0.5 mL demonstrating no intravascular uptake. Then a solution containing  2% MPF lidocaine was injected x 0.5 mL.  Then the left L4 superior articular process in transverse process junction was targeted.  Bone contact was made.  Isovue 200 was injected x 0.5 mL demonstrating no intravascular uptake.  Then a solution containing2% MPF lidocaine was injected x 0.5 mL.  This same procedure was performed on the right side using the same needle, technique and injectate.  Patient tolerated procedure well.  Post procedure instructions were given.  Preinjection pain level  Postinjection pain level 0/10 
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
## 2019-05-21 NOTE — Patient Instructions (Signed)

## 2019-06-25 ENCOUNTER — Encounter: Payer: 59 | Admitting: Physical Medicine & Rehabilitation

## 2019-07-21 ENCOUNTER — Encounter: Payer: 59 | Attending: Registered Nurse | Admitting: Registered Nurse

## 2019-07-21 ENCOUNTER — Other Ambulatory Visit: Payer: Self-pay

## 2019-07-21 ENCOUNTER — Encounter: Payer: Self-pay | Admitting: Registered Nurse

## 2019-07-21 ENCOUNTER — Ambulatory Visit: Payer: 59 | Admitting: Registered Nurse

## 2019-07-21 VITALS — BP 99/66 | HR 68 | Temp 97.9°F | Ht 60.0 in | Wt 116.0 lb

## 2019-07-21 DIAGNOSIS — M47816 Spondylosis without myelopathy or radiculopathy, lumbar region: Secondary | ICD-10-CM

## 2019-07-21 DIAGNOSIS — M7061 Trochanteric bursitis, right hip: Secondary | ICD-10-CM

## 2019-07-21 DIAGNOSIS — M62838 Other muscle spasm: Secondary | ICD-10-CM

## 2019-07-21 DIAGNOSIS — M7062 Trochanteric bursitis, left hip: Secondary | ICD-10-CM

## 2019-07-21 DIAGNOSIS — M542 Cervicalgia: Secondary | ICD-10-CM

## 2019-07-21 DIAGNOSIS — Z5181 Encounter for therapeutic drug level monitoring: Secondary | ICD-10-CM | POA: Diagnosis present

## 2019-07-21 DIAGNOSIS — M961 Postlaminectomy syndrome, not elsewhere classified: Secondary | ICD-10-CM | POA: Diagnosis present

## 2019-07-21 DIAGNOSIS — M609 Myositis, unspecified: Secondary | ICD-10-CM | POA: Diagnosis present

## 2019-07-21 DIAGNOSIS — F341 Dysthymic disorder: Secondary | ICD-10-CM | POA: Diagnosis present

## 2019-07-21 DIAGNOSIS — M47817 Spondylosis without myelopathy or radiculopathy, lumbosacral region: Secondary | ICD-10-CM | POA: Diagnosis present

## 2019-07-21 DIAGNOSIS — M47812 Spondylosis without myelopathy or radiculopathy, cervical region: Secondary | ICD-10-CM

## 2019-07-21 DIAGNOSIS — M5412 Radiculopathy, cervical region: Secondary | ICD-10-CM | POA: Diagnosis present

## 2019-07-21 DIAGNOSIS — G894 Chronic pain syndrome: Secondary | ICD-10-CM

## 2019-07-21 DIAGNOSIS — M797 Fibromyalgia: Secondary | ICD-10-CM

## 2019-07-21 DIAGNOSIS — G890 Central pain syndrome: Secondary | ICD-10-CM | POA: Insufficient documentation

## 2019-07-21 DIAGNOSIS — F418 Other specified anxiety disorders: Secondary | ICD-10-CM | POA: Insufficient documentation

## 2019-07-21 DIAGNOSIS — M19012 Primary osteoarthritis, left shoulder: Secondary | ICD-10-CM

## 2019-07-21 DIAGNOSIS — Z79899 Other long term (current) drug therapy: Secondary | ICD-10-CM

## 2019-07-21 MED ORDER — HYDROCODONE-ACETAMINOPHEN 10-325 MG PO TABS: 1.0000 | ORAL_TABLET | Freq: Four times a day (QID) | ORAL | 0 refills | Status: DC | PRN
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
## 2019-09-15 ENCOUNTER — Encounter: Payer: Self-pay | Admitting: Registered Nurse

## 2019-09-15 ENCOUNTER — Other Ambulatory Visit: Payer: Self-pay

## 2019-09-15 ENCOUNTER — Encounter: Payer: 59 | Attending: Registered Nurse | Admitting: Registered Nurse

## 2019-09-15 VITALS — BP 115/78 | HR 73 | Temp 97.9°F | Ht 60.0 in | Wt 113.0 lb

## 2019-09-15 DIAGNOSIS — Z5181 Encounter for therapeutic drug level monitoring: Secondary | ICD-10-CM | POA: Diagnosis present

## 2019-09-15 DIAGNOSIS — M47817 Spondylosis without myelopathy or radiculopathy, lumbosacral region: Secondary | ICD-10-CM

## 2019-09-15 DIAGNOSIS — M609 Myositis, unspecified: Secondary | ICD-10-CM | POA: Insufficient documentation

## 2019-09-15 DIAGNOSIS — G894 Chronic pain syndrome: Secondary | ICD-10-CM

## 2019-09-15 DIAGNOSIS — M5412 Radiculopathy, cervical region: Secondary | ICD-10-CM | POA: Diagnosis present

## 2019-09-15 DIAGNOSIS — F341 Dysthymic disorder: Secondary | ICD-10-CM | POA: Insufficient documentation

## 2019-09-15 DIAGNOSIS — M797 Fibromyalgia: Secondary | ICD-10-CM

## 2019-09-15 DIAGNOSIS — G890 Central pain syndrome: Secondary | ICD-10-CM | POA: Diagnosis present

## 2019-09-15 DIAGNOSIS — Z79899 Other long term (current) drug therapy: Secondary | ICD-10-CM | POA: Diagnosis present

## 2019-09-15 DIAGNOSIS — F418 Other specified anxiety disorders: Secondary | ICD-10-CM | POA: Insufficient documentation

## 2019-09-15 DIAGNOSIS — M47812 Spondylosis without myelopathy or radiculopathy, cervical region: Secondary | ICD-10-CM | POA: Diagnosis present

## 2019-09-15 DIAGNOSIS — M47816 Spondylosis without myelopathy or radiculopathy, lumbar region: Secondary | ICD-10-CM

## 2019-09-15 DIAGNOSIS — M542 Cervicalgia: Secondary | ICD-10-CM

## 2019-09-15 DIAGNOSIS — M7062 Trochanteric bursitis, left hip: Secondary | ICD-10-CM

## 2019-09-15 DIAGNOSIS — M19012 Primary osteoarthritis, left shoulder: Secondary | ICD-10-CM

## 2019-09-15 DIAGNOSIS — M7061 Trochanteric bursitis, right hip: Secondary | ICD-10-CM

## 2019-09-15 DIAGNOSIS — M961 Postlaminectomy syndrome, not elsewhere classified: Secondary | ICD-10-CM | POA: Diagnosis present

## 2019-09-15 DIAGNOSIS — M62838 Other muscle spasm: Secondary | ICD-10-CM

## 2019-09-15 MED ORDER — HYDROCODONE-ACETAMINOPHEN 10-325 MG PO TABS: 1.0000 | ORAL_TABLET | Freq: Four times a day (QID) | ORAL | 0 refills | Status: DC | PRN
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
## 2019-09-21 LAB — DRUG TOX ALC METAB W/CON, ORAL FLD: Alcohol Metabolite: NEGATIVE ng/mL (ref <25)

## 2019-09-21 LAB — DRUG TOX MONITOR 1 W/CONF, ORAL FLD
Alprazolam: 0.85 ng/mL — ABNORMAL HIGH (ref <0.50)
Amphetamines: NEGATIVE ng/mL (ref <10)
Barbiturates: NEGATIVE ng/mL (ref <10)
Benzodiazepines: POSITIVE ng/mL — AB (ref <0.50)
Buprenorphine: NEGATIVE ng/mL (ref <0.10)
Chlordiazepoxide: NEGATIVE ng/mL (ref <0.50)
Clonazepam: NEGATIVE ng/mL (ref <0.50)
Cocaine: NEGATIVE ng/mL (ref <5.0)
Codeine: NEGATIVE ng/mL (ref <2.5)
Diazepam: NEGATIVE ng/mL (ref <0.50)
Dihydrocodeine: 7.4 ng/mL — ABNORMAL HIGH (ref <2.5)
Fentanyl: NEGATIVE ng/mL (ref <0.10)
Flunitrazepam: NEGATIVE ng/mL (ref <0.50)
Flurazepam: NEGATIVE ng/mL (ref <0.50)
Heroin Metabolite: NEGATIVE ng/mL (ref <1.0)
Hydrocodone: 144.3 ng/mL — ABNORMAL HIGH (ref <2.5)
Hydromorphone: NEGATIVE ng/mL (ref <2.5)
Lorazepam: NEGATIVE ng/mL (ref <0.50)
MARIJUANA: NEGATIVE ng/mL (ref <2.5)
MDMA: NEGATIVE ng/mL (ref <10)
Meprobamate: NEGATIVE ng/mL (ref <2.5)
Methadone: NEGATIVE ng/mL (ref <5.0)
Midazolam: NEGATIVE ng/mL (ref <0.50)
Morphine: NEGATIVE ng/mL (ref <2.5)
Nicotine Metabolite: NEGATIVE ng/mL (ref <5.0)
Nordiazepam: NEGATIVE ng/mL (ref <0.50)
Norhydrocodone: 5.2 ng/mL — ABNORMAL HIGH (ref <2.5)
Noroxycodone: NEGATIVE ng/mL (ref <2.5)
Opiates: POSITIVE ng/mL — AB (ref <2.5)
Oxazepam: NEGATIVE ng/mL (ref <0.50)
Oxycodone: NEGATIVE ng/mL (ref <2.5)
Oxymorphone: NEGATIVE ng/mL (ref <2.5)
Phencyclidine: NEGATIVE ng/mL (ref <10)
Tapentadol: NEGATIVE ng/mL (ref <5.0)
Temazepam: NEGATIVE ng/mL (ref <0.50)
Tramadol: NEGATIVE ng/mL (ref <5.0)
Triazolam: NEGATIVE ng/mL (ref <0.50)
Zolpidem: NEGATIVE ng/mL (ref <5.0)

## 2019-09-28 ENCOUNTER — Telehealth: Payer: Self-pay | Admitting: *Deleted

## 2019-09-28 NOTE — Telephone Encounter (Signed)
Oral swab drug screen was consistent for prescribed medications.  ?

## 2019-10-17 ENCOUNTER — Other Ambulatory Visit: Payer: Self-pay | Admitting: Registered Nurse

## 2019-10-17 DIAGNOSIS — M47812 Spondylosis without myelopathy or radiculopathy, cervical region: Secondary | ICD-10-CM

## 2019-10-17 DIAGNOSIS — M47817 Spondylosis without myelopathy or radiculopathy, lumbosacral region: Secondary | ICD-10-CM

## 2019-10-28 DIAGNOSIS — O98513 Other viral diseases complicating pregnancy, third trimester: Secondary | ICD-10-CM

## 2019-10-28 DIAGNOSIS — U071 COVID-19: Secondary | ICD-10-CM

## 2019-10-28 HISTORY — DX: COVID-19: O98.513

## 2019-10-28 HISTORY — DX: COVID-19: U07.1

## 2019-11-10 ENCOUNTER — Encounter: Payer: 59 | Attending: Registered Nurse | Admitting: Registered Nurse

## 2019-11-10 ENCOUNTER — Other Ambulatory Visit: Payer: Self-pay

## 2019-11-10 ENCOUNTER — Encounter: Payer: Self-pay | Admitting: Registered Nurse

## 2019-11-10 VITALS — BP 90/60 | HR 77 | Temp 97.7°F | Ht 60.0 in | Wt 117.0 lb

## 2019-11-10 DIAGNOSIS — M47817 Spondylosis without myelopathy or radiculopathy, lumbosacral region: Secondary | ICD-10-CM

## 2019-11-10 DIAGNOSIS — M47816 Spondylosis without myelopathy or radiculopathy, lumbar region: Secondary | ICD-10-CM

## 2019-11-10 DIAGNOSIS — M542 Cervicalgia: Secondary | ICD-10-CM

## 2019-11-10 DIAGNOSIS — G890 Central pain syndrome: Secondary | ICD-10-CM | POA: Diagnosis present

## 2019-11-10 DIAGNOSIS — Z79899 Other long term (current) drug therapy: Secondary | ICD-10-CM | POA: Diagnosis present

## 2019-11-10 DIAGNOSIS — M47812 Spondylosis without myelopathy or radiculopathy, cervical region: Secondary | ICD-10-CM | POA: Diagnosis present

## 2019-11-10 DIAGNOSIS — M961 Postlaminectomy syndrome, not elsewhere classified: Secondary | ICD-10-CM | POA: Insufficient documentation

## 2019-11-10 DIAGNOSIS — M19012 Primary osteoarthritis, left shoulder: Secondary | ICD-10-CM

## 2019-11-10 DIAGNOSIS — M7062 Trochanteric bursitis, left hip: Secondary | ICD-10-CM

## 2019-11-10 DIAGNOSIS — F341 Dysthymic disorder: Secondary | ICD-10-CM | POA: Diagnosis present

## 2019-11-10 DIAGNOSIS — M5412 Radiculopathy, cervical region: Secondary | ICD-10-CM

## 2019-11-10 DIAGNOSIS — F418 Other specified anxiety disorders: Secondary | ICD-10-CM | POA: Diagnosis present

## 2019-11-10 DIAGNOSIS — M797 Fibromyalgia: Secondary | ICD-10-CM

## 2019-11-10 DIAGNOSIS — Z5181 Encounter for therapeutic drug level monitoring: Secondary | ICD-10-CM

## 2019-11-10 DIAGNOSIS — M62838 Other muscle spasm: Secondary | ICD-10-CM

## 2019-11-10 DIAGNOSIS — M609 Myositis, unspecified: Secondary | ICD-10-CM | POA: Diagnosis present

## 2019-11-10 DIAGNOSIS — M7061 Trochanteric bursitis, right hip: Secondary | ICD-10-CM

## 2019-11-10 DIAGNOSIS — G894 Chronic pain syndrome: Secondary | ICD-10-CM

## 2019-11-10 MED ORDER — HYDROCODONE-ACETAMINOPHEN 10-325 MG PO TABS: 1.0000 | ORAL_TABLET | Freq: Four times a day (QID) | ORAL | 0 refills | Status: DC | PRN
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
## 2020-01-10 ENCOUNTER — Other Ambulatory Visit: Payer: Self-pay

## 2020-01-10 ENCOUNTER — Encounter: Payer: 59 | Attending: Registered Nurse | Admitting: Registered Nurse

## 2020-01-10 ENCOUNTER — Encounter: Payer: Self-pay | Admitting: Registered Nurse

## 2020-01-10 VITALS — BP 95/64 | HR 75 | Temp 97.5°F | Ht 59.5 in | Wt 114.0 lb

## 2020-01-10 DIAGNOSIS — M47817 Spondylosis without myelopathy or radiculopathy, lumbosacral region: Secondary | ICD-10-CM | POA: Diagnosis present

## 2020-01-10 DIAGNOSIS — M47812 Spondylosis without myelopathy or radiculopathy, cervical region: Secondary | ICD-10-CM | POA: Insufficient documentation

## 2020-01-10 DIAGNOSIS — F418 Other specified anxiety disorders: Secondary | ICD-10-CM | POA: Diagnosis present

## 2020-01-10 DIAGNOSIS — M62838 Other muscle spasm: Secondary | ICD-10-CM

## 2020-01-10 DIAGNOSIS — M47816 Spondylosis without myelopathy or radiculopathy, lumbar region: Secondary | ICD-10-CM

## 2020-01-10 DIAGNOSIS — F341 Dysthymic disorder: Secondary | ICD-10-CM | POA: Diagnosis present

## 2020-01-10 DIAGNOSIS — M609 Myositis, unspecified: Secondary | ICD-10-CM | POA: Diagnosis present

## 2020-01-10 DIAGNOSIS — G894 Chronic pain syndrome: Secondary | ICD-10-CM | POA: Diagnosis present

## 2020-01-10 DIAGNOSIS — Z79899 Other long term (current) drug therapy: Secondary | ICD-10-CM | POA: Diagnosis present

## 2020-01-10 DIAGNOSIS — M5412 Radiculopathy, cervical region: Secondary | ICD-10-CM | POA: Insufficient documentation

## 2020-01-10 DIAGNOSIS — M7062 Trochanteric bursitis, left hip: Secondary | ICD-10-CM

## 2020-01-10 DIAGNOSIS — Z5181 Encounter for therapeutic drug level monitoring: Secondary | ICD-10-CM | POA: Insufficient documentation

## 2020-01-10 DIAGNOSIS — M542 Cervicalgia: Secondary | ICD-10-CM

## 2020-01-10 DIAGNOSIS — G890 Central pain syndrome: Secondary | ICD-10-CM | POA: Diagnosis present

## 2020-01-10 DIAGNOSIS — M961 Postlaminectomy syndrome, not elsewhere classified: Secondary | ICD-10-CM | POA: Insufficient documentation

## 2020-01-10 DIAGNOSIS — M797 Fibromyalgia: Secondary | ICD-10-CM

## 2020-01-10 MED ORDER — HYDROCODONE-ACETAMINOPHEN 10-325 MG PO TABS: 1.0000 | ORAL_TABLET | Freq: Four times a day (QID) | ORAL | 0 refills | Status: DC | PRN
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
## 2020-01-12 ENCOUNTER — Encounter: Payer: Self-pay | Admitting: Otolaryngology

## 2020-01-12 ENCOUNTER — Other Ambulatory Visit: Payer: Self-pay

## 2020-01-13 ENCOUNTER — Encounter: Payer: Self-pay | Admitting: Obstetrics and Gynecology

## 2020-01-13 ENCOUNTER — Other Ambulatory Visit (HOSPITAL_COMMUNITY)
Admission: RE | Admit: 2020-01-13 | Discharge: 2020-01-13 | Disposition: A | Discharge status: Home / Self Care | Payer: 59 | Source: Ambulatory Visit | Attending: Obstetrics and Gynecology | Admitting: Obstetrics and Gynecology

## 2020-01-13 ENCOUNTER — Ambulatory Visit (INDEPENDENT_AMBULATORY_CARE_PROVIDER_SITE_OTHER): Payer: 59 | Admitting: Obstetrics and Gynecology

## 2020-01-13 VITALS — BP 90/60 | Ht 59.5 in | Wt 116.0 lb

## 2020-01-13 DIAGNOSIS — N6322 Unspecified lump in the left breast, upper inner quadrant: Secondary | ICD-10-CM | POA: Diagnosis not present

## 2020-01-13 DIAGNOSIS — N952 Postmenopausal atrophic vaginitis: Secondary | ICD-10-CM | POA: Diagnosis not present

## 2020-01-13 DIAGNOSIS — Z90710 Acquired absence of both cervix and uterus: Secondary | ICD-10-CM

## 2020-01-13 DIAGNOSIS — Z124 Encounter for screening for malignant neoplasm of cervix: Secondary | ICD-10-CM | POA: Diagnosis not present

## 2020-01-13 DIAGNOSIS — Z01411 Encounter for gynecological examination (general) (routine) with abnormal findings: Secondary | ICD-10-CM | POA: Diagnosis not present

## 2020-01-13 DIAGNOSIS — N6320 Unspecified lump in the left breast, unspecified quadrant: Secondary | ICD-10-CM

## 2020-01-13 DIAGNOSIS — Z1231 Encounter for screening mammogram for malignant neoplasm of breast: Secondary | ICD-10-CM

## 2020-01-13 DIAGNOSIS — Z1151 Encounter for screening for human papillomavirus (HPV): Secondary | ICD-10-CM | POA: Diagnosis not present

## 2020-01-13 DIAGNOSIS — Z01419 Encounter for gynecological examination (general) (routine) without abnormal findings: Secondary | ICD-10-CM

## 2020-01-13 LAB — DRUG TOX MONITOR 1 W/CONF, ORAL FLD
Alprazolam: 1.38 ng/mL — ABNORMAL HIGH (ref <0.50)
Amphetamines: NEGATIVE ng/mL (ref <10)
Barbiturates: NEGATIVE ng/mL (ref <10)
Benzodiazepines: POSITIVE ng/mL — AB (ref <0.50)
Buprenorphine: NEGATIVE ng/mL (ref <0.10)
Chlordiazepoxide: NEGATIVE ng/mL (ref <0.50)
Clonazepam: NEGATIVE ng/mL (ref <0.50)
Cocaine: NEGATIVE ng/mL (ref <5.0)
Codeine: NEGATIVE ng/mL (ref <2.5)
Diazepam: NEGATIVE ng/mL (ref <0.50)
Dihydrocodeine: 7.9 ng/mL — ABNORMAL HIGH (ref <2.5)
Fentanyl: NEGATIVE ng/mL (ref <0.10)
Flunitrazepam: NEGATIVE ng/mL (ref <0.50)
Flurazepam: NEGATIVE ng/mL (ref <0.50)
Heroin Metabolite: NEGATIVE ng/mL (ref <1.0)
Hydrocodone: >250 ng/mL — ABNORMAL HIGH (ref <2.5)
Hydromorphone: NEGATIVE ng/mL (ref <2.5)
Lorazepam: NEGATIVE ng/mL (ref <0.50)
MARIJUANA: NEGATIVE ng/mL (ref <2.5)
MDMA: NEGATIVE ng/mL (ref <10)
Meprobamate: NEGATIVE ng/mL (ref <2.5)
Methadone: NEGATIVE ng/mL (ref <5.0)
Midazolam: NEGATIVE ng/mL (ref <0.50)
Morphine: NEGATIVE ng/mL (ref <2.5)
Nicotine Metabolite: NEGATIVE ng/mL (ref <5.0)
Nordiazepam: NEGATIVE ng/mL (ref <0.50)
Norhydrocodone: 6.5 ng/mL — ABNORMAL HIGH (ref <2.5)
Noroxycodone: NEGATIVE ng/mL (ref <2.5)
Opiates: POSITIVE ng/mL — AB (ref <2.5)
Oxazepam: NEGATIVE ng/mL (ref <0.50)
Oxycodone: NEGATIVE ng/mL (ref <2.5)
Oxymorphone: NEGATIVE ng/mL (ref <2.5)
Phencyclidine: NEGATIVE ng/mL (ref <10)
Tapentadol: NEGATIVE ng/mL (ref <5.0)
Temazepam: NEGATIVE ng/mL (ref <0.50)
Tramadol: NEGATIVE ng/mL (ref <5.0)
Triazolam: NEGATIVE ng/mL (ref <0.50)
Zolpidem: NEGATIVE ng/mL (ref <5.0)

## 2020-01-13 LAB — DRUG TOX ALC METAB W/CON, ORAL FLD: Alcohol Metabolite: NEGATIVE ng/mL (ref <25)

## 2020-01-13 NOTE — Progress Notes (Signed)
Chief Complaint  Patient presents with  . Gynecologic Exam    lump on LB that has been there for a while     HPI:      [name] is a 59 y.o. G2P2 who LMP was No LMP recorded. Patient has had a hysterectomy., presents today for her annual examination.  Her menses are absent due to [facility]. She does not have intermenstrual bleeding.  She does not have vasomotor sx.   Sex activity: rarely with single partner, contraception - status post hysterectomy. She does have vaginal dryness. She used to use premarin vag crm wkly with sx relief, but now uses about monthly. Doesn't need RF currently.   Last Pap: 01/22/17 Results were: no abnormalities /neg HPV DNA. Plain pap 7/16.  Last mammogram: May 02, 2015 at [place]. Results were: normal--routine follow-up in 12 months. Has implants. There is no FH of breast cancer. There is no FH of ovarian cancer. The patient does do self-breast exams. Has noticed LT breast mass recently, can't tell if part of implant or breast mass. RT breast implant has gotten harder and has stinging/burning pain in RT breast.   Colonoscopy: 2020 at [facility]; repeat due after 5 years due to hyst of polyps. DEXA: hx of osteoporosis due to early menopause with hyst. Does calcium/vit D/exercise and is getting injections twice yearly.  Tobacco use: The patient denies current or previous tobacco use. Alcohol use: none  No drug use Exercise: moderately active  She does get adequate calcium and Vitamin D in her diet.  Labs with PCP.   Past Medical History:  Diagnosis Date  . Arthritis    seronegative inflammatory arthritis/cervical disc disease  . C. difficile colitis 2008  . Chronic cholecystitis 05/07/2013  . Colon polyps 2008  . Complication of anesthesia    BP drops very low  . COVID-19 affecting pregnancy in third trimester 10/28/2019   had negative test first week of March 2021  . GERD (gastroesophageal reflux disease)   . Headache    sinus  . Hiatal  hernia 2014  . Hypercholesterolemia   . Mild depression (HCC)   . Neuromuscular disorder (HCC) 2009   centralized pain syndrome/fibromylagia  . Osteoporosis   . PONV (postoperative nausea and vomiting)     Past Surgical History:  Procedure Laterality Date  . arm surgery     "muscle came off the elbow"  . CESAREAN SECTION    . CHOLECYSTECTOMY    . COLONOSCOPY  2008  . KNEE ARTHROSCOPY  2004/2008   x2  . NECK SURGERY    . SPINE SURGERY  2008   cervical fusion  . TOTAL ABDOMINAL HYSTERECTOMY W/ BILATERAL SALPINGOOPHORECTOMY  1989  . UPPER GI ENDOSCOPY  04-27-13   Dr [name]    Family History  Problem Relation Age of Onset  . COPD Mother   . Asthma Mother   . Diabetes Brother   . Stroke Brother   . Hypertension Brother      ROS:  Review of Systems  Constitutional: Negative for fatigue, fever and unexpected weight change.  Respiratory: Negative for cough, shortness of breath and wheezing.   Cardiovascular: Negative for chest pain, palpitations and leg swelling.  Gastrointestinal: Negative for blood in stool, constipation, diarrhea, nausea and vomiting.  Endocrine: Negative for cold intolerance, heat intolerance and polyuria.  Genitourinary: Negative for dyspareunia, dysuria, flank pain, frequency, genital sores, hematuria, menstrual problem, pelvic pain, urgency, vaginal bleeding, vaginal discharge and vaginal pain.  Musculoskeletal:  Negative for back pain, joint swelling and myalgias.  Skin: Negative for rash.  Neurological: Negative for dizziness, syncope, light-headedness, numbness and headaches.  Hematological: Negative for adenopathy.  Psychiatric/Behavioral: Negative for agitation, confusion, sleep disturbance and suicidal ideas. The patient is not nervous/anxious.     Objective: BP 90/60   Ht 4' 11.5" (1.511 m)   Wt 116 lb (52.6 kg)   BMI 23.04 kg/m    Physical Exam Constitutional:      Appearance: She is well-developed.  Genitourinary:     Vulva and  vagina normal.     No vaginal discharge, erythema or tenderness.     No right or left adnexal mass present.     Right adnexa not tender.     Left adnexa not tender.     Genitourinary Comments: UTERUS/CX SURG REM  Neck:     Thyroid: No thyromegaly.  Cardiovascular:     Rate and Rhythm: Normal rate and regular rhythm.     Heart sounds: Normal heart sounds. No murmur.  Pulmonary:     Effort: Pulmonary effort is normal.     Breath sounds: Normal breath sounds.  Chest:     Breasts:        Right: No mass, nipple discharge, skin change or tenderness.        Left: Mass present. No nipple discharge, skin change or tenderness.       Comments: RT BREAST IMPLANT VERY FIRM Abdominal:     Palpations: Abdomen is soft.     Tenderness: There is no abdominal tenderness. There is no guarding.  Musculoskeletal:        General: Normal range of motion.     Cervical back: Normal range of motion.  Neurological:     General: No focal deficit present.     Mental Status: She is alert and oriented to person, place, and time.     Cranial Nerves: No cranial nerve deficit.  Skin:    General: Skin is warm and dry.  Psychiatric:        Mood and Affect: Mood normal.        Behavior: Behavior normal.        Thought Content: Thought content normal.        Judgment: Judgment normal.  Vitals reviewed.    Assessment/Plan:  Encounter for annual routine gynecological examination  Cervical cancer screening - Plan: Cytology - PAP  Screening for HPV (human papillomavirus) - Plan: Cytology - PAP  Encounter for screening mammogram for malignant neoplasm of breast - Plan: US BREAST LTD UNI RIGHT INC AXILLA, US BREAST LTD UNI LEFT INC AXILLA, MM DIAG BREAST TOMO BILATERAL, LT breast mass 10:00 position. Check dx mammo and u/s. Pt with bilat implants. Will f/u with results.   Left breast mass - Plan: US BREAST LTD UNI RIGHT INC AXILLA, US BREAST LTD UNI LEFT INC AXILLA, MM DIAG BREAST TOMO BILATERAL  Vaginal  atrophy--rarely sex active. Has prem vag crm prn sx. Will call for RF prn.          GYN counsel mammography screening, menopause, adequate intake of calcium and vitamin D     F/U  Return in about 1 year (around 01/12/2021).  Murphy Duzan B. Jefrey Raburn, PA-C 01/13/2020 4:34 PM

## 2020-01-13 NOTE — Patient Instructions (Addendum)
I value your feedback and entrusting us with your care. If you get a [facility] patient survey, I would appreciate you taking the time to let us know about your experience today. Thank you!  As of October 07, 2019, your lab results will be released to your MyChart immediately, before I even have a chance to see them. Please give me time to review them and contact you if there are any abnormalities. Thank you for your patience.   [facility]: [phone]  [facility]: [phone]  

## 2020-01-14 ENCOUNTER — Other Ambulatory Visit
Admission: RE | Admit: 2020-01-14 | Discharge: 2020-01-14 | Disposition: A | Discharge status: Home / Self Care | Payer: 59 | Source: Ambulatory Visit | Attending: Otolaryngology | Admitting: Otolaryngology

## 2020-01-14 ENCOUNTER — Other Ambulatory Visit: Payer: Self-pay

## 2020-01-14 DIAGNOSIS — Z20822 Contact with and (suspected) exposure to covid-19: Secondary | ICD-10-CM | POA: Insufficient documentation

## 2020-01-14 DIAGNOSIS — Z01812 Encounter for preprocedural laboratory examination: Secondary | ICD-10-CM | POA: Diagnosis not present

## 2020-01-14 LAB — SARS CORONAVIRUS 2 (TAT 6-24 HRS): SARS Coronavirus 2: NEGATIVE

## 2020-01-17 LAB — CYTOLOGY - PAP
Comment: NEGATIVE
Diagnosis: NEGATIVE
High risk HPV: NEGATIVE

## 2020-01-18 ENCOUNTER — Telehealth: Payer: Self-pay | Admitting: Registered Nurse

## 2020-01-18 NOTE — Telephone Encounter (Signed)
Oral Swab was Performed on 01/10/2020, it was consistent for prescribe medication.

## 2020-01-21 ENCOUNTER — Other Ambulatory Visit
Admission: RE | Admit: 2020-01-21 | Discharge: 2020-01-21 | Disposition: A | Discharge status: Home / Self Care | Payer: 59 | Source: Ambulatory Visit | Attending: Otolaryngology | Admitting: Otolaryngology

## 2020-01-21 ENCOUNTER — Other Ambulatory Visit: Payer: Self-pay

## 2020-01-21 ENCOUNTER — Ambulatory Visit
Admission: RE | Admit: 2020-01-21 | Discharge: 2020-01-21 | Disposition: A | Discharge status: Home / Self Care | Payer: 59 | Source: Ambulatory Visit | Attending: Obstetrics and Gynecology | Admitting: Obstetrics and Gynecology

## 2020-01-21 ENCOUNTER — Other Ambulatory Visit: Payer: Self-pay | Admitting: Obstetrics and Gynecology

## 2020-01-21 DIAGNOSIS — N6320 Unspecified lump in the left breast, unspecified quadrant: Secondary | ICD-10-CM

## 2020-01-21 DIAGNOSIS — Z1231 Encounter for screening mammogram for malignant neoplasm of breast: Secondary | ICD-10-CM

## 2020-01-21 DIAGNOSIS — N6322 Unspecified lump in the left breast, upper inner quadrant: Secondary | ICD-10-CM | POA: Insufficient documentation

## 2020-01-21 DIAGNOSIS — Z20822 Contact with and (suspected) exposure to covid-19: Secondary | ICD-10-CM | POA: Insufficient documentation

## 2020-01-21 DIAGNOSIS — Z01812 Encounter for preprocedural laboratory examination: Secondary | ICD-10-CM | POA: Insufficient documentation

## 2020-01-21 LAB — SARS CORONAVIRUS 2 (TAT 6-24 HRS): SARS Coronavirus 2: NEGATIVE

## 2020-01-23 ENCOUNTER — Encounter: Payer: Self-pay | Admitting: Obstetrics and Gynecology

## 2020-01-24 NOTE — Anesthesia Preprocedure Evaluation (Addendum)
Anesthesia Evaluation  Patient identified by MRN, date of birth, ID band Patient awake    Reviewed: Allergy & Precautions, NPO status , Patient's Chart, lab work & pertinent test results  History of Anesthesia Complications (+) PONV and history of anesthetic complications  Airway Mallampati: II  TM Distance: >3 FB Neck ROM: Full    Dental   Pulmonary former smoker,   COVID+ 10/28/19   breath sounds clear to auscultation       Cardiovascular (-) angina(-) DOE  Rhythm:Regular Rate:Normal   HLD   Neuro/Psych  Headaches, PSYCHIATRIC DISORDERS Anxiety Depression  Fibromyalgia  Neuromuscular disease (Lumbar spondylosis)    GI/Hepatic hiatal hernia, GERD  Controlled,  Endo/Other    Renal/GU      Musculoskeletal  (+) Arthritis ,   Abdominal   Peds  Hematology   Anesthesia Other Findings   Reproductive/Obstetrics                           Anesthesia Physical Anesthesia Plan  ASA: II  Anesthesia Plan: General   Post-op Pain Management:    Induction: Intravenous  PONV Risk Score and Plan: 4 or greater and Treatment may vary due to age or medical condition, Ondansetron, Dexamethasone and Scopolamine patch - Pre-op  Airway Management Planned: Oral ETT  Additional Equipment:   Intra-op Plan:   Post-operative Plan: Extubation in OR  Informed Consent: I have reviewed the patients History and Physical, chart, labs and discussed the procedure including the risks, benefits and alternatives for the proposed anesthesia with the patient or authorized representative who has indicated his/her understanding and acceptance.       Plan Discussed with: CRNA and Anesthesiologist  Anesthesia Plan Comments:         Anesthesia Quick Evaluation

## 2020-01-25 ENCOUNTER — Ambulatory Visit: Payer: 59 | Admitting: Anesthesiology

## 2020-01-25 ENCOUNTER — Encounter
Admission: RE | Disposition: A | Discharge status: Home / Self Care | Payer: Self-pay | Source: Home / Self Care | Attending: Otolaryngology

## 2020-01-25 ENCOUNTER — Encounter: Payer: Self-pay | Admitting: Otolaryngology

## 2020-01-25 ENCOUNTER — Other Ambulatory Visit: Payer: Self-pay

## 2020-01-25 ENCOUNTER — Ambulatory Visit
Admission: RE | Admit: 2020-01-25 | Discharge: 2020-01-25 | Disposition: A | Discharge status: Home / Self Care | Payer: 59 | Attending: Otolaryngology | Admitting: Otolaryngology

## 2020-01-25 DIAGNOSIS — Z79891 Long term (current) use of opiate analgesic: Secondary | ICD-10-CM | POA: Insufficient documentation

## 2020-01-25 DIAGNOSIS — F329 Major depressive disorder, single episode, unspecified: Secondary | ICD-10-CM | POA: Insufficient documentation

## 2020-01-25 DIAGNOSIS — Z8616 Personal history of COVID-19: Secondary | ICD-10-CM | POA: Diagnosis not present

## 2020-01-25 DIAGNOSIS — J320 Chronic maxillary sinusitis: Secondary | ICD-10-CM | POA: Diagnosis not present

## 2020-01-25 DIAGNOSIS — F419 Anxiety disorder, unspecified: Secondary | ICD-10-CM | POA: Insufficient documentation

## 2020-01-25 DIAGNOSIS — Z87891 Personal history of nicotine dependence: Secondary | ICD-10-CM | POA: Diagnosis not present

## 2020-01-25 DIAGNOSIS — M797 Fibromyalgia: Secondary | ICD-10-CM | POA: Diagnosis not present

## 2020-01-25 DIAGNOSIS — Z79899 Other long term (current) drug therapy: Secondary | ICD-10-CM | POA: Insufficient documentation

## 2020-01-25 HISTORY — PX: MAXILLARY ANTROSTOMY: SHX2003

## 2020-01-25 HISTORY — PX: ENDOSCOPIC CONCHA BULLOSA RESECTION: SHX6395

## 2020-01-25 HISTORY — PX: IMAGE GUIDED SINUS SURGERY: SHX6570

## 2020-01-25 HISTORY — DX: Other complications of anesthesia, initial encounter: T88.59XA

## 2020-01-25 HISTORY — DX: Headache, unspecified: R51.9

## 2020-01-25 HISTORY — DX: Other specified postprocedural states: Z98.890

## 2020-01-25 HISTORY — DX: Other specified postprocedural states: R11.2

## 2020-01-25 SURGERY — SINUS SURGERY, WITH IMAGING GUIDANCE
Anesthesia: General | Site: Nose | Laterality: Left

## 2020-01-25 MED ORDER — EPHEDRINE SULFATE 50 MG/ML IJ SOLN
INTRAMUSCULAR | Status: DC | PRN
  Administered 2020-01-25 (×2): 10 mg via INTRAVENOUS

## 2020-01-25 MED ORDER — SUCCINYLCHOLINE CHLORIDE 20 MG/ML IJ SOLN
INTRAMUSCULAR | Status: DC | PRN
  Administered 2020-01-25: 80 mg via INTRAVENOUS

## 2020-01-25 MED ORDER — OXYCODONE HCL 5 MG PO TABS: 5.0000 mg | ORAL_TABLET | Freq: Once | ORAL | Status: AC | PRN

## 2020-01-25 MED ORDER — ACETAMINOPHEN 10 MG/ML IV SOLN
1000.0000 mg | Freq: Once | INTRAVENOUS | Status: AC
  Administered 2020-01-25: 1000 mg via INTRAVENOUS

## 2020-01-25 MED ORDER — OXYCODONE HCL 5 MG/5ML PO SOLN
5.0000 mg | Freq: Once | ORAL | Status: AC | PRN
  Administered 2020-01-25: 5 mg via ORAL

## 2020-01-25 MED ORDER — MIDAZOLAM HCL 5 MG/5ML IJ SOLN
INTRAMUSCULAR | Status: DC | PRN
  Administered 2020-01-25 (×2): 1 mg via INTRAVENOUS

## 2020-01-25 MED ORDER — DEXAMETHASONE SODIUM PHOSPHATE 4 MG/ML IJ SOLN
INTRAMUSCULAR | Status: DC | PRN
  Administered 2020-01-25: 10 mg via INTRAVENOUS

## 2020-01-25 MED ORDER — LIDOCAINE-EPINEPHRINE 1 %-1:100000 IJ SOLN
INTRAMUSCULAR | Status: DC | PRN
  Administered 2020-01-25: 9 mL

## 2020-01-25 MED ORDER — FENTANYL CITRATE (PF) 100 MCG/2ML IJ SOLN
INTRAMUSCULAR | Status: DC | PRN
  Administered 2020-01-25: 50 ug via INTRAVENOUS

## 2020-01-25 MED ORDER — SCOPOLAMINE 1 MG/3DAYS TD PT72
1.0000 | MEDICATED_PATCH | TRANSDERMAL | Status: DC
  Administered 2020-01-25: 1.5 mg via TRANSDERMAL

## 2020-01-25 MED ORDER — MEPERIDINE HCL 25 MG/ML IJ SOLN
6.2500 mg | INTRAMUSCULAR | Status: DC | PRN
  Administered 2020-01-25: 6.25 mg via INTRAVENOUS

## 2020-01-25 MED ORDER — HYDROMORPHONE HCL 1 MG/ML IJ SOLN
0.2500 mg | INTRAMUSCULAR | Status: DC | PRN
  Administered 2020-01-25: 0.2 mg via INTRAVENOUS
  Administered 2020-01-25 (×2): 0.4 mg via INTRAVENOUS

## 2020-01-25 MED ORDER — GLYCOPYRROLATE 0.2 MG/ML IJ SOLN
INTRAMUSCULAR | Status: DC | PRN
  Administered 2020-01-25: .1 mg via INTRAVENOUS

## 2020-01-25 MED ORDER — LIDOCAINE HCL (CARDIAC) PF 100 MG/5ML IV SOSY
PREFILLED_SYRINGE | INTRAVENOUS | Status: DC | PRN
  Administered 2020-01-25: 40 mg via INTRAVENOUS

## 2020-01-25 MED ORDER — ONDANSETRON HCL 4 MG/2ML IJ SOLN
INTRAMUSCULAR | Status: DC | PRN
  Administered 2020-01-25: 4 mg via INTRAVENOUS

## 2020-01-25 MED ORDER — LACTATED RINGERS IV SOLN
100.0000 mL/h | INTRAVENOUS | Status: DC
  Administered 2020-01-25: 100 mL/h via INTRAVENOUS

## 2020-01-25 MED ORDER — HYDROCODONE-ACETAMINOPHEN 5-325 MG PO TABS: 1.0000 | ORAL_TABLET | Freq: Four times a day (QID) | ORAL | 0 refills | Status: DC | PRN

## 2020-01-25 MED ORDER — PROPOFOL 10 MG/ML IV BOLUS
INTRAVENOUS | Status: DC | PRN
  Administered 2020-01-25: 110 mg via INTRAVENOUS

## 2020-01-25 MED ORDER — OXYMETAZOLINE HCL 0.05 % NA SOLN
NASAL | Status: DC | PRN
  Administered 2020-01-25: 1 via TOPICAL

## 2020-01-25 MED ORDER — PROMETHAZINE HCL 25 MG/ML IJ SOLN: 6.2500 mg | INTRAMUSCULAR | Status: DC | PRN

## 2020-01-25 SURGICAL SUPPLY — 21 items
BATTERY INSTRU NAVIGATION (MISCELLANEOUS) ×12 IMPLANT
CANISTER SUCT 1200ML W/VALVE (MISCELLANEOUS) ×4 IMPLANT
COAG SUCT 10F 3.5MM HAND CTRL (MISCELLANEOUS) ×4 IMPLANT
ELECT REM PT RETURN 9FT ADLT (ELECTROSURGICAL) ×4
ELECTRODE REM PT RTRN 9FT ADLT (ELECTROSURGICAL) ×2 IMPLANT
GLOVE BIO SURGEON STRL SZ7.5 (GLOVE) ×12 IMPLANT
GOWN STRL REUS W/ TWL LRG LVL3 (GOWN DISPOSABLE) ×2 IMPLANT
GOWN STRL REUS W/TWL LRG LVL3 (GOWN DISPOSABLE) ×4
IV NS 500ML (IV SOLUTION) ×4
IV NS 500ML BAXH (IV SOLUTION) ×2 IMPLANT
KIT TURNOVER KIT A (KITS) ×4 IMPLANT
NS IRRIG 500ML POUR BTL (IV SOLUTION) ×4 IMPLANT
PACK ENT CUSTOM (PACKS) ×4 IMPLANT
PATTIES SURGICAL .5 X3 (DISPOSABLE) ×4 IMPLANT
SHAVER DIEGO BLD STD TYPE A (BLADE) ×4 IMPLANT
SOL ANTI-FOG 6CC FOG-OUT (MISCELLANEOUS) ×2 IMPLANT
SOL FOG-OUT ANTI-FOG 6CC (MISCELLANEOUS) ×2
SYR 10ML LL (SYRINGE) IMPLANT
TRACKER CRANIALMASK (MASK) ×4 IMPLANT
TUBING DECLOG MULTIDEBRIDER (TUBING) ×4 IMPLANT
WATER STERILE IRR 250ML POUR (IV SOLUTION) ×4 IMPLANT

## 2020-01-25 NOTE — Transfer of Care (Signed)
Immediate Anesthesia Transfer of Care Note  Patient: Kathryn Farrell  Procedure(s) Performed: IMAGE GUIDED SINUS SURGERY (Left ) MAXILLARY ANTROSTOMY with tissue (Left Nose) ENDOSCOPIC CONCHA BULLOSA RESECTION (Left Nose)  Patient Location: PACU  Anesthesia Type: General  Level of Consciousness: awake, alert  and patient cooperative  Airway and Oxygen Therapy: Patient Spontanous Breathing and Patient connected to supplemental oxygen  Post-op Assessment: Post-op Vital signs reviewed, Patient's Cardiovascular Status Stable, Respiratory Function Stable, Patent Airway and No signs of Nausea or vomiting  Post-op Vital Signs: Reviewed and stable  Complications: No apparent anesthesia complications

## 2020-01-25 NOTE — Anesthesia Procedure Notes (Signed)
Procedure Name: Intubation Date/Time: 01/25/2020 9:35 AM Performed by: Jimmy Picket, CRNA Pre-anesthesia Checklist: Patient identified, Emergency Drugs available, Suction available, Patient being monitored and Timeout performed Patient Re-evaluated:Patient Re-evaluated prior to induction Oxygen Delivery Method: Circle system utilized Preoxygenation: Pre-oxygenation with 100% oxygen Induction Type: IV induction Ventilation: Mask ventilation without difficulty Laryngoscope Size: Miller and 2 Grade View: Grade I Tube type: Oral Rae Tube size: 7.0 mm Number of attempts: 1 Placement Confirmation: ETT inserted through vocal cords under direct vision,  positive ETCO2 and breath sounds checked- equal and bilateral Tube secured with: Tape Dental Injury: Teeth and Oropharynx as per pre-operative assessment

## 2020-01-25 NOTE — Op Note (Signed)
01/25/2020  10:09 AM    Kathryn Farrell  [id number]   Pre-Op Diagnosis:  sinusitis Post-op Diagnosis: sinusitis  Procedure:  1)  Image Guided Sinus Surgery,   2)  Left Endoscopic Maxillary Antrostomy with Tissue Removal   3)  Left concha bullosa excision      Surgeon:  Sandi Mealy  Anesthesia:  General endotracheal  EBL:  Less than 10cc  Complications:  None  Findings: Mucosal thickening in the left maxillary sinus with small amount of foreign material (possible dental amalgum) removed from the medial wall mucosa. No frank purulence. Dental work was visualized inferolaterally with no granulation tissue or purulence seen in that region  Procedure: After the patient was identified in holding and the benefits of the procedure were reviewed as well as the consent and risks, the patient was taken to the operating room and with the patient in a comfortable supine position,  general orotracheal anesthesia was induced without difficulty.  A proper time-out was performed.  The [employer] image guidance system was set up and calibrated in the normal fashion and felt to be acceptable.  Next 1% Xylocaine with 1:100,000 epinephrine was infiltrated into the left inferior turbinate and anterior middle turbinate.  Several minutes were allowed for this to take effect.  Cottoniod pledgets soaked in Afrin were placed into the left nasal cavity left while the patient was prepped and draped in the standard fashion. The image guided suction was calibrated and used to inspect known points in the nasal cavity to assess accuracy of the image guided system. Accuracy was felt to be excellent.   The left middle turbinate was incised anterior with a sickle knife, entering the concha bullosa. The lateral wall of the concha bullosa was then resected with endoscopic scissors. The uncinate process was then resected with through-cutting forceps as well as the microdebrider. In this fashion the uncinate was completely  removed along with soft tissue and bone of the medial wall of the maxillary sinus to create a large patent maxillary antrostomy, communicating posteriorly with the secondary os. A 70 degree scope was then used to inspect the sinus. On the scan, radio-opaque material had been noted along the medial wall of the sinus, and this was located endoscopically and suctioned clear of the sinus. The left maxillary sinus was then irrigated several times. No gross purulence was noted though there was some mild mucosal thickening. The dental work was visualized inferolaterally and there was no note of any severe edema, granulation tissue, or gross purulence there. The microdebrider was used to clean up loose mucosal and bony fragments in the middle meatus from the antrostomy and concha bullosa resection. Bleeding was well controlled with no need for packing.   The patient was then returned to the anesthesiologist for awakening and taken to recovery room in good condition postoperatively.  Disposition:   PACU and d/c home  Plan: Ice, elevation, narcotic analgesia and prophylactic antibiotics. Begin sinus irrigations with saline tomrrow, irrigating 3-4 times daily. Return to the office in 7 days.  Return to work in 7-10 days, no strenuous activities for two weeks.   Sandi Mealy 01/25/2020 10:09 AM

## 2020-01-25 NOTE — H&P (Signed)
History and physical reviewed and will be scanned in later. No change in medical status reported by the patient or family, appears stable for surgery. All questions regarding the procedure answered, and patient (or family if a child) expressed understanding of the procedure. ? ?Kathryn Farrell ?@TODAY@ ?

## 2020-01-25 NOTE — Anesthesia Postprocedure Evaluation (Signed)
Anesthesia Post Note  Patient: Kathryn Farrell  Procedure(s) Performed: IMAGE GUIDED SINUS SURGERY (Left ) MAXILLARY ANTROSTOMY with tissue (Left Nose) ENDOSCOPIC CONCHA BULLOSA RESECTION (Left Nose)     Patient location during evaluation: PACU Anesthesia Type: General Level of consciousness: awake and alert Pain management: pain level controlled Vital Signs Assessment: post-procedure vital signs reviewed and stable Respiratory status: spontaneous breathing, nonlabored ventilation, respiratory function stable and patient connected to nasal cannula oxygen Cardiovascular status: blood pressure returned to baseline and stable Postop Assessment: no apparent nausea or vomiting Anesthetic complications: no    Yissel Habermehl A  [name]

## 2020-01-25 NOTE — Discharge Instructions (Signed)
[facility] ENDOSCOPIC SINUS SURGERY [facility]  What is Functional Endoscopic Sinus Surgery?  The Surgery involves making the natural openings of the sinuses larger by removing the bony partitions that separate the sinuses from the nasal cavity.  The natural sinus lining is preserved as much as possible to allow the sinuses to resume normal function after the surgery.  In some patients nasal polyps (excessively swollen lining of the sinuses) may be removed to relieve obstruction of the sinus openings.  The surgery is performed through the nose using lighted scopes, which eliminates the need for incisions on the face.  A septoplasty is a different procedure which is sometimes performed with sinus surgery.  It involves straightening the boy partition that separates the two sides of your nose.  A crooked or deviated septum may need repair if is obstructing the sinuses or nasal airflow.  Turbinate reduction is also often performed during sinus surgery.  The turbinates are bony proturberances from the side walls of the nose which swell and can obstruct the nose in patients with sinus and allergy problems.  Their size can be surgically reduced to help relieve nasal obstruction.  What Can Sinus Surgery Do For Me?  Sinus surgery can reduce the frequency of sinus infections requiring antibiotic treatment.  This can provide improvement in nasal congestion, post-nasal drainage, facial pressure and nasal obstruction.  Surgery will NOT prevent you from ever having an infection again, so it usually only for patients who get infections 4 or more times yearly requiring antibiotics, or for infections that do not clear with antibiotics.  It will not cure nasal allergies, so patients with allergies may still require medication to treat their allergies after surgery. Surgery may improve headaches related to sinusitis, however, some people will continue to  require medication to control sinus headaches related to allergies.  Surgery will do nothing for other forms of headache (migraine, tension or cluster).  What Are the Risks of Endoscopic Sinus Surgery?  Current techniques allow surgery to be performed safely with little risk, however, there are rare complications that patients should be aware of.  Because the sinuses are located around the eyes, there is risk of eye injury, including blindness, though again, this would be quite rare. This is usually a result of bleeding behind the eye during surgery, which puts the vision oat risk, though there are treatments to protect the vision and prevent permanent disrupted by surgery causing a leak of the spinal fluid that surrounds the brain.  More serious complications would include bleeding inside the brain cavity or damage to the brain.  Again, all of these complications are uncommon, and spinal fluid leaks can be safely managed surgically if they occur.  The most common complication of sinus surgery is bleeding from the nose, which may require packing or cauterization of the nose.  Continued sinus have polyps may experience recurrence of the polyps requiring revision surgery.  Alterations of sense of smell or injury to the tear ducts are also rare complications.   What is the Surgery Like, and what is the Recovery?  The Surgery usually takes a couple of hours to perform, and is usually performed under a general anesthetic (completely asleep).  Patients are usually discharged home after a couple of hours.  Sometimes during surgery it is necessary to pack the nose to control bleeding, and the packing is left in place for 24 - 48 hours, and removed by your surgeon.    If a septoplasty was performed during the procedure, there is often a splint placed which must be removed after 5-7 days.   Discomfort: Pain is usually mild to moderate, and can be controlled by prescription pain medication or acetaminophen (Tylenol).   Aspirin, Ibuprofen (Advil, Motrin), or Naprosyn (Aleve) should be avoided, as they can cause increased bleeding.  Most patients feel sinus pressure like they have a bad head cold for several days.  Sleeping with your head elevated can help reduce swelling and facial pressure, as can ice packs over the face.  A humidifier may be helpful to keep the mucous and blood from drying in the nose.   Diet: There are no specific diet restrictions, however, you should generally start with clear liquids and a light diet of bland foods because the anesthetic can cause some nausea.  Advance your diet depending on how your stomach feels.  Taking your pain medication with food will often help reduce stomach upset which pain medications can cause.  Nasal Saline Irrigation: It is important to remove blood clots and dried mucous from the nose as it is healing.  This is done by having you irrigate the nose at least 3 - 4 times daily with a salt water solution.  We recommend using NeilMed Sinus Rinse (available at the drug store).  Fill the squeeze bottle with the solution, bend over a sink, and insert the tip of the squeeze bottle into the nose  of an inch.  Point the tip of the squeeze bottle towards the inside corner of the eye on the same side your irrigating.  Squeeze the bottle and gently irrigate the nose.  If you bend forward as you do this, most of the fluid will flow back out of the nose, instead of down your throat.   The solution should be warm, near body temperature, when you irrigate.   Each time you irrigate, you should use a full squeeze bottle.   Note that if you are instructed to use Nasal Steroid Sprays at any time after your surgery, irrigate with saline BEFORE using the steroid spray, so you do not wash it all out of the nose. Another product, Nasal Saline Gel (such as AYR Nasal Saline Gel) can be applied in each nostril 3 - 4 times daily to moisture the nose and reduce scabbing or crusting.  Bleeding:   Bloody drainage from the nose can be expected for several days, and patients are instructed to irrigate their nose frequently with salt water to help remove mucous and blood clots.  The drainage may be dark red or brown, though some fresh blood may be seen intermittently, especially after irrigation.  Do not blow you nose, as bleeding may occur. If you must sneeze, keep your mouth open to allow air to escape through your mouth.  If heavy bleeding occurs: Irrigate the nose with saline to rinse out clots, then spray the nose 3 - 4 times with Afrin Nasal Decongestant Spray.  The spray will constrict the blood vessels to slow bleeding.  Pinch the lower half of your nose shut to apply pressure, and lay down with your head elevated.  Ice packs over the nose may help as well. If bleeding persists despite these measures, you should notify your doctor.  Do not use the Afrin routinely to control nasal congestion after surgery, as it can result in worsening congestion and may affect healing.   Activity: Return to work varies among patients. Most patients will be out of   work at least 5 - 7 days to recover.  Patient may return to work after they are off of narcotic pain medication, and feeling well enough to perform the functions of their job.  Patients must avoid heavy lifting (over 10 pounds) or strenuous physical for 2 weeks after surgery, so your employer may need to assign you to light duty, or keep you out of work longer if light duty is not possible.  NOTE: you should not drive, operate dangerous machinery, do any mentally demanding tasks or make any important legal or financial decisions while on narcotic pain medication and recovering from the general anesthetic.    Call Your Doctor Immediately if You Have Any of the Following: 1. Bleeding that you cannot control with the above measures 2. Loss of vision, double vision, bulging of the eye or black eyes. 3. Fever over 101 degrees 4. Neck stiffness with severe  headache, fever, nausea and change in mental state. You are always encourage to call anytime with concerns, however, please call with requests for pain medication refills during office hours.  Office Endoscopy: During follow-up visits your doctor will remove any packing or splints that may have been placed and evaluate and clean your sinuses endoscopically.  Topical anesthetic will be used to make this as comfortable as possible, though you may want to take your pain medication prior to the visit.  How often this will need to be done varies from patient to patient.  After complete recovery from the surgery, you may need follow-up endoscopy from time to time, particularly if there is concern of recurrent infection or nasal polyps.  General Anesthesia, Adult, Care After This sheet gives you information about how to care for yourself after your procedure. Your health care provider may also give you more specific instructions. If you have problems or questions, contact your health care provider. What can I expect after the procedure? After the procedure, the following side effects are common:  Pain or discomfort at the IV site.  Nausea.  Vomiting.  Sore throat.  Trouble concentrating.  Feeling cold or chills.  Weak or tired.  Sleepiness and fatigue.  Soreness and body aches. These side effects can affect parts of the body that were not involved in surgery. Follow these instructions at home:  For at least 24 hours after the procedure:  Have a responsible adult stay with you. It is important to have someone help care for you until you are awake and alert.  Rest as needed.  Do not: ? Participate in activities in which you could fall or become injured. ? Drive. ? Use heavy machinery. ? Drink alcohol. ? Take sleeping pills or medicines that cause drowsiness. ? Make important decisions or sign legal documents. ? Take care of children on your own. Eating and drinking  Follow any  instructions from your health care provider about eating or drinking restrictions.  When you feel hungry, start by eating small amounts of foods that are soft and easy to digest (bland), such as toast. Gradually return to your regular diet.  Drink enough fluid to keep your urine pale yellow.  If you vomit, rehydrate by drinking water, juice, or clear broth. General instructions  If you have sleep apnea, surgery and certain medicines can increase your risk for breathing problems. Follow instructions from your health care provider about wearing your sleep device: ? Anytime you are sleeping, including during daytime naps. ? While taking prescription pain medicines, sleeping medicines, or medicines that make you   drowsy.  Return to your normal activities as told by your health care provider. Ask your health care provider what activities are safe for you.  Take over-the-counter and prescription medicines only as told by your health care provider.  If you smoke, do not smoke without supervision.  Keep all follow-up visits as told by your health care provider. This is important. Contact a health care provider if:  You have nausea or vomiting that does not get better with medicine.  You cannot eat or drink without vomiting.  You have pain that does not get better with medicine.  You are unable to pass urine.  You develop a skin rash.  You have a fever.  You have redness around your IV site that gets worse. Get help right away if:  You have difficulty breathing.  You have chest pain.  You have blood in your urine or stool, or you vomit blood. Summary  After the procedure, it is common to have a sore throat or nausea. It is also common to feel tired.  Have a responsible adult stay with you for the first 24 hours after general anesthesia. It is important to have someone help care for you until you are awake and alert.  When you feel hungry, start by eating small amounts of foods  that are soft and easy to digest (bland), such as toast. Gradually return to your regular diet.  Drink enough fluid to keep your urine pale yellow.  Return to your normal activities as told by your health care provider. Ask your health care provider what activities are safe for you. This information is not intended to replace advice given to you by your health care provider. Make sure you discuss any questions you have with your health care provider. Document Revised: 10/17/2017 Document Reviewed: 05/30/2017 Elsevier Patient Education  2020 [employer]  Scopolamine skin patches Remove in 72 hrs. Wash hands immediately after removal. What is this medicine? SCOPOLAMINE (skoe POL a meen) is used to prevent nausea and vomiting caused by motion sickness, anesthesia and surgery. This medicine may be used for other purposes; ask your health care provider or pharmacist if you have questions. COMMON BRAND NAME(S): Transderm Scop What should I tell my health care provider before I take this medicine? They need to know if you have any of these conditions:  are scheduled to have a gastric secretion test  glaucoma  heart disease  kidney disease  liver disease  lung or breathing disease, like asthma  mental illness  prostate disease  seizures  stomach or intestine problems  trouble passing urine  an unusual or allergic reaction to scopolamine, atropine, other medicines, foods, dyes, or preservatives  pregnant or trying to get pregnant  breast-feeding How should I use this medicine? This medicine is for external use only. Follow the directions on the prescription label. Wear only 1 patch at a time. Choose an area behind the ear, that is clean, dry, hairless and free from any cuts or irritation. Wipe the area with a clean dry tissue. Peel off the plastic backing of the skin patch, trying not to touch the adhesive side with your hands. Do not cut the patches. Firmly apply to the area  you have chosen, with the metallic side of the patch to the skin and the tan-colored side showing. Once firmly in place, wash your hands well with soap and water. Do not get this medicine into your eyes. After removing the patch, wash your hands and the   area behind your ear thoroughly with soap and water. The patch will still contain some medicine after use. To avoid accidental contact or ingestion by children or pets, fold the used patch in half with the sticky side together and throw away in the trash out of the reach of children and pets. If you need to use a second patch after you remove the first, place it behind the other ear. A special MedGuide will be given to you by the pharmacist with each prescription and refill. Be sure to read this information carefully each time. Talk to your pediatrician regarding the use of this medicine in children. Special care may be needed. Overdosage: If you think you have taken too much of this medicine contact a poison control center or emergency room at once. NOTE: This medicine is only for you. Do not share this medicine with others. What if I miss a dose? This does not apply. This medicine is not for regular use. What may interact with this medicine?  alcohol  antihistamines for allergy cough and cold  atropine  certain medicines for anxiety or sleep  certain medicines for bladder problems like oxybutynin, tolterodine  certain medicines for depression like amitriptyline, fluoxetine, sertraline  certain medicines for stomach problems like dicyclomine, hyoscyamine  certain medicines for Parkinson's disease like benztropine, trihexyphenidyl  certain medicines for seizures like phenobarbital, primidone  general anesthetics like halothane, isoflurane, methoxyflurane, propofol  ipratropium  local anesthetics like lidocaine, pramoxine, tetracaine  medicines that relax muscles for surgery  phenothiazines like chlorpromazine, mesoridazine,  prochlorperazine, thioridazine  narcotic medicines for pain  other belladonna alkaloids This list may not describe all possible interactions. Give your health care provider a list of all the medicines, herbs, non-prescription drugs, or dietary supplements you use. Also tell them if you smoke, drink alcohol, or use illegal drugs. Some items may interact with your medicine. What should I watch for while using this medicine? Limit contact with water while swimming and bathing because the patch may fall off. If the patch falls off, throw it away and put a new one behind the other ear. You may get drowsy or dizzy. Do not drive, use machinery, or do anything that needs mental alertness until you know how this medicine affects you. Do not stand or sit up quickly, especially if you are an older patient. This reduces the risk of dizzy or fainting spells. Alcohol may interfere with the effect of this medicine. Avoid alcoholic drinks. Your mouth may get dry. Chewing sugarless gum or sucking hard candy, and drinking plenty of water may help. Contact your healthcare professional if the problem does not go away or is severe. This medicine may cause dry eyes and blurred vision. If you wear contact lenses, you may feel some discomfort. Lubricating drops may help. See your healthcare professional if the problem does not go away or is severe. If you are going to need surgery, an MRI, CT scan, or other procedure, tell your healthcare professional that you are using this medicine. You may need to remove the patch before the procedure. What side effects may I notice from receiving this medicine? Side effects that you should report to your doctor or health care professional as soon as possible:  allergic reactions like skin rash, itching or hives; swelling of the face, lips, or tongue  blurred vision  changes in vision  confusion  dizziness  eye pain  fast, irregular heartbeat  hallucinations, loss of contact  with reality  nausea, vomiting    pain or trouble passing urine  restlessness  seizures  skin irritation  stomach pain Side effects that usually do not require medical attention (report to your doctor or health care professional if they continue or are bothersome):  drowsiness  dry mouth  headache  sore throat This list may not describe all possible side effects. Call your doctor for medical advice about side effects. You may report side effects to FDA at 1-800-FDA-1088. Where should I keep my medicine? Keep out of the reach of children. Store at room temperature between 20 and 25 degrees C (68 and 77 degrees F). Keep this medicine in the foil package until ready to use. Throw away any unused medicine after the expiration date. NOTE: This sheet is a summary. It may not cover all possible information. If you have questions about this medicine, talk to your doctor, pharmacist, or health care provider.  2020 Elsevier/Gold Standard (2018-01-02 16:14:46)   

## 2020-01-26 ENCOUNTER — Encounter: Payer: Self-pay | Admitting: *Deleted

## 2020-01-27 LAB — SURGICAL PATHOLOGY

## 2020-03-03 ENCOUNTER — Other Ambulatory Visit: Payer: Self-pay

## 2020-03-03 ENCOUNTER — Encounter: Payer: Self-pay | Admitting: Registered Nurse

## 2020-03-03 ENCOUNTER — Encounter: Payer: 59 | Attending: Registered Nurse | Admitting: Registered Nurse

## 2020-03-03 VITALS — BP 102/65 | HR 60 | Temp 97.9°F | Ht 59.0 in | Wt 115.4 lb

## 2020-03-03 DIAGNOSIS — M7061 Trochanteric bursitis, right hip: Secondary | ICD-10-CM

## 2020-03-03 DIAGNOSIS — G890 Central pain syndrome: Secondary | ICD-10-CM | POA: Diagnosis present

## 2020-03-03 DIAGNOSIS — M609 Myositis, unspecified: Secondary | ICD-10-CM | POA: Diagnosis present

## 2020-03-03 DIAGNOSIS — M5412 Radiculopathy, cervical region: Secondary | ICD-10-CM | POA: Diagnosis present

## 2020-03-03 DIAGNOSIS — F418 Other specified anxiety disorders: Secondary | ICD-10-CM | POA: Diagnosis present

## 2020-03-03 DIAGNOSIS — M542 Cervicalgia: Secondary | ICD-10-CM | POA: Diagnosis not present

## 2020-03-03 DIAGNOSIS — M47817 Spondylosis without myelopathy or radiculopathy, lumbosacral region: Secondary | ICD-10-CM | POA: Diagnosis present

## 2020-03-03 DIAGNOSIS — Z79899 Other long term (current) drug therapy: Secondary | ICD-10-CM | POA: Diagnosis present

## 2020-03-03 DIAGNOSIS — M797 Fibromyalgia: Secondary | ICD-10-CM

## 2020-03-03 DIAGNOSIS — M62838 Other muscle spasm: Secondary | ICD-10-CM

## 2020-03-03 DIAGNOSIS — M961 Postlaminectomy syndrome, not elsewhere classified: Secondary | ICD-10-CM

## 2020-03-03 DIAGNOSIS — Z5181 Encounter for therapeutic drug level monitoring: Secondary | ICD-10-CM | POA: Diagnosis present

## 2020-03-03 DIAGNOSIS — F341 Dysthymic disorder: Secondary | ICD-10-CM | POA: Diagnosis present

## 2020-03-03 DIAGNOSIS — G894 Chronic pain syndrome: Secondary | ICD-10-CM | POA: Diagnosis present

## 2020-03-03 DIAGNOSIS — M47816 Spondylosis without myelopathy or radiculopathy, lumbar region: Secondary | ICD-10-CM

## 2020-03-03 DIAGNOSIS — M47812 Spondylosis without myelopathy or radiculopathy, cervical region: Secondary | ICD-10-CM

## 2020-03-03 DIAGNOSIS — M7062 Trochanteric bursitis, left hip: Secondary | ICD-10-CM

## 2020-03-03 MED ORDER — HYDROCODONE-ACETAMINOPHEN 10-325 MG PO TABS: 1.0000 | ORAL_TABLET | Freq: Four times a day (QID) | ORAL | 0 refills | Status: DC | PRN
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
## 2020-04-08 ENCOUNTER — Other Ambulatory Visit: Payer: Self-pay | Admitting: Registered Nurse

## 2020-04-10 NOTE — Telephone Encounter (Signed)
Request for Hydrocodone/APAP. Patient picked up prescription on 04/08/2020. It says I am not authorized to  deny prescription.

## 2020-05-05 ENCOUNTER — Encounter: Payer: Self-pay | Admitting: Registered Nurse

## 2020-05-05 ENCOUNTER — Encounter: Payer: 59 | Attending: Registered Nurse | Admitting: Registered Nurse

## 2020-05-05 ENCOUNTER — Other Ambulatory Visit: Payer: Self-pay

## 2020-05-05 VITALS — BP 91/60 | HR 67 | Temp 98.4°F | Ht 59.5 in | Wt 115.6 lb

## 2020-05-05 DIAGNOSIS — M797 Fibromyalgia: Secondary | ICD-10-CM

## 2020-05-05 DIAGNOSIS — M5416 Radiculopathy, lumbar region: Secondary | ICD-10-CM

## 2020-05-05 DIAGNOSIS — G894 Chronic pain syndrome: Secondary | ICD-10-CM | POA: Insufficient documentation

## 2020-05-05 DIAGNOSIS — F418 Other specified anxiety disorders: Secondary | ICD-10-CM | POA: Diagnosis present

## 2020-05-05 DIAGNOSIS — Z79899 Other long term (current) drug therapy: Secondary | ICD-10-CM | POA: Insufficient documentation

## 2020-05-05 DIAGNOSIS — Z5181 Encounter for therapeutic drug level monitoring: Secondary | ICD-10-CM | POA: Insufficient documentation

## 2020-05-05 DIAGNOSIS — G890 Central pain syndrome: Secondary | ICD-10-CM | POA: Insufficient documentation

## 2020-05-05 DIAGNOSIS — M47812 Spondylosis without myelopathy or radiculopathy, cervical region: Secondary | ICD-10-CM | POA: Diagnosis present

## 2020-05-05 DIAGNOSIS — M609 Myositis, unspecified: Secondary | ICD-10-CM | POA: Insufficient documentation

## 2020-05-05 DIAGNOSIS — M62838 Other muscle spasm: Secondary | ICD-10-CM

## 2020-05-05 DIAGNOSIS — M47817 Spondylosis without myelopathy or radiculopathy, lumbosacral region: Secondary | ICD-10-CM | POA: Diagnosis present

## 2020-05-05 DIAGNOSIS — M961 Postlaminectomy syndrome, not elsewhere classified: Secondary | ICD-10-CM | POA: Insufficient documentation

## 2020-05-05 DIAGNOSIS — Z79891 Long term (current) use of opiate analgesic: Secondary | ICD-10-CM | POA: Insufficient documentation

## 2020-05-05 DIAGNOSIS — M7061 Trochanteric bursitis, right hip: Secondary | ICD-10-CM

## 2020-05-05 DIAGNOSIS — M542 Cervicalgia: Secondary | ICD-10-CM

## 2020-05-05 DIAGNOSIS — M5412 Radiculopathy, cervical region: Secondary | ICD-10-CM | POA: Diagnosis present

## 2020-05-05 DIAGNOSIS — M47816 Spondylosis without myelopathy or radiculopathy, lumbar region: Secondary | ICD-10-CM

## 2020-05-05 DIAGNOSIS — M7062 Trochanteric bursitis, left hip: Secondary | ICD-10-CM

## 2020-05-05 DIAGNOSIS — F341 Dysthymic disorder: Secondary | ICD-10-CM | POA: Insufficient documentation

## 2020-05-05 MED ORDER — HYDROCODONE-ACETAMINOPHEN 10-325 MG PO TABS: 1.0000 | ORAL_TABLET | Freq: Four times a day (QID) | ORAL | 0 refills | Status: DC | PRN
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
## 2020-05-10 ENCOUNTER — Telehealth: Payer: Self-pay | Admitting: *Deleted

## 2020-05-10 LAB — DRUG TOX MONITOR 1 W/CONF, ORAL FLD
Alprazolam: 0.99 ng/mL — ABNORMAL HIGH (ref <0.50)
Amphetamines: NEGATIVE ng/mL (ref <10)
Barbiturates: NEGATIVE ng/mL (ref <10)
Benzodiazepines: POSITIVE ng/mL — AB (ref <0.50)
Buprenorphine: NEGATIVE ng/mL (ref <0.10)
Chlordiazepoxide: NEGATIVE ng/mL (ref <0.50)
Clonazepam: NEGATIVE ng/mL (ref <0.50)
Cocaine: NEGATIVE ng/mL (ref <5.0)
Codeine: NEGATIVE ng/mL (ref <2.5)
Diazepam: NEGATIVE ng/mL (ref <0.50)
Dihydrocodeine: 6.5 ng/mL — ABNORMAL HIGH (ref <2.5)
Fentanyl: NEGATIVE ng/mL (ref <0.10)
Flunitrazepam: NEGATIVE ng/mL (ref <0.50)
Flurazepam: NEGATIVE ng/mL (ref <0.50)
Heroin Metabolite: NEGATIVE ng/mL (ref <1.0)
Hydrocodone: 43.4 ng/mL — ABNORMAL HIGH (ref <2.5)
Hydromorphone: NEGATIVE ng/mL (ref <2.5)
Lorazepam: NEGATIVE ng/mL (ref <0.50)
MARIJUANA: NEGATIVE ng/mL (ref <2.5)
MDMA: NEGATIVE ng/mL (ref <10)
Meprobamate: NEGATIVE ng/mL (ref <2.5)
Methadone: NEGATIVE ng/mL (ref <5.0)
Midazolam: NEGATIVE ng/mL (ref <0.50)
Morphine: NEGATIVE ng/mL (ref <2.5)
Nicotine Metabolite: NEGATIVE ng/mL (ref <5.0)
Nordiazepam: NEGATIVE ng/mL (ref <0.50)
Norhydrocodone: 5.1 ng/mL — ABNORMAL HIGH (ref <2.5)
Noroxycodone: NEGATIVE ng/mL (ref <2.5)
Opiates: POSITIVE ng/mL — AB (ref <2.5)
Oxazepam: NEGATIVE ng/mL (ref <0.50)
Oxycodone: NEGATIVE ng/mL (ref <2.5)
Oxymorphone: NEGATIVE ng/mL (ref <2.5)
Phencyclidine: NEGATIVE ng/mL (ref <10)
Tapentadol: NEGATIVE ng/mL (ref <5.0)
Temazepam: NEGATIVE ng/mL (ref <0.50)
Tramadol: NEGATIVE ng/mL (ref <5.0)
Triazolam: NEGATIVE ng/mL (ref <0.50)
Zolpidem: NEGATIVE ng/mL (ref <5.0)

## 2020-05-10 LAB — DRUG TOX ALC METAB W/CON, ORAL FLD: Alcohol Metabolite: NEGATIVE ng/mL (ref <25)

## 2020-05-10 NOTE — Telephone Encounter (Signed)
Oral swab drug screen was consistent for prescribed medications.  ?

## 2020-06-23 ENCOUNTER — Encounter: Payer: Self-pay | Admitting: Physical Medicine & Rehabilitation

## 2020-06-23 ENCOUNTER — Encounter: Payer: 59 | Attending: Registered Nurse | Admitting: Physical Medicine & Rehabilitation

## 2020-06-23 VITALS — BP 91/59 | HR 60 | Temp 98.2°F | Ht 59.5 in | Wt 116.0 lb

## 2020-06-23 DIAGNOSIS — Z79899 Other long term (current) drug therapy: Secondary | ICD-10-CM | POA: Insufficient documentation

## 2020-06-23 DIAGNOSIS — M5412 Radiculopathy, cervical region: Secondary | ICD-10-CM | POA: Diagnosis present

## 2020-06-23 DIAGNOSIS — M7541 Impingement syndrome of right shoulder: Secondary | ICD-10-CM | POA: Insufficient documentation

## 2020-06-23 DIAGNOSIS — M25511 Pain in right shoulder: Secondary | ICD-10-CM | POA: Diagnosis not present

## 2020-06-23 DIAGNOSIS — M47812 Spondylosis without myelopathy or radiculopathy, cervical region: Secondary | ICD-10-CM | POA: Insufficient documentation

## 2020-06-23 DIAGNOSIS — M47817 Spondylosis without myelopathy or radiculopathy, lumbosacral region: Secondary | ICD-10-CM | POA: Diagnosis present

## 2020-06-23 DIAGNOSIS — M19012 Primary osteoarthritis, left shoulder: Secondary | ICD-10-CM

## 2020-06-23 DIAGNOSIS — Z79891 Long term (current) use of opiate analgesic: Secondary | ICD-10-CM | POA: Diagnosis present

## 2020-06-23 DIAGNOSIS — G890 Central pain syndrome: Secondary | ICD-10-CM | POA: Insufficient documentation

## 2020-06-23 DIAGNOSIS — M961 Postlaminectomy syndrome, not elsewhere classified: Secondary | ICD-10-CM | POA: Diagnosis present

## 2020-06-23 DIAGNOSIS — Z5181 Encounter for therapeutic drug level monitoring: Secondary | ICD-10-CM | POA: Diagnosis present

## 2020-06-23 DIAGNOSIS — G894 Chronic pain syndrome: Secondary | ICD-10-CM | POA: Insufficient documentation

## 2020-06-23 DIAGNOSIS — F341 Dysthymic disorder: Secondary | ICD-10-CM | POA: Diagnosis present

## 2020-06-23 DIAGNOSIS — F418 Other specified anxiety disorders: Secondary | ICD-10-CM | POA: Diagnosis present

## 2020-06-23 DIAGNOSIS — M609 Myositis, unspecified: Secondary | ICD-10-CM | POA: Diagnosis present

## 2020-06-23 DIAGNOSIS — M25512 Pain in left shoulder: Secondary | ICD-10-CM | POA: Diagnosis not present

## 2020-06-23 NOTE — Patient Instructions (Signed)
Shoulder Exercises Ask your health care provider which exercises are safe for you. Do exercises exactly as told by your health care provider and adjust them as directed. It is normal to feel mild stretching, pulling, tightness, or discomfort as you do these exercises. Stop right away if you feel sudden pain or your pain gets worse. Do not begin these exercises until told by your health care provider. Stretching exercises External rotation and abduction This exercise is sometimes called corner stretch. This exercise rotates your arm outward (external rotation) and moves your arm out from your body (abduction). 1. Stand in a doorway with one of your feet slightly in front of the other. This is called a staggered stance. If you cannot reach your forearms to the door frame, stand facing a corner of a room. 2. Choose one of the following positions as told by your health care provider: ? Place your hands and forearms on the door frame above your head. ? Place your hands and forearms on the door frame at the height of your head. ? Place your hands on the door frame at the height of your elbows. 3. Slowly move your weight onto your front foot until you feel a stretch across your chest and in the front of your shoulders. Keep your head and chest upright and keep your abdominal muscles tight. 4. Hold for __________ seconds. 5. To release the stretch, shift your weight to your back foot. Repeat __________ times. Complete this exercise __________ times a day. Extension, standing 1. Stand and hold a broomstick, a cane, or a similar object behind your back. ? Your hands should be a little wider than shoulder width apart. ? Your palms should face away from your back. 2. Keeping your elbows straight and your shoulder muscles relaxed, move the stick away from your body until you feel a stretch in your shoulders (extension). ? Avoid shrugging your shoulders while you move the stick. Keep your shoulder blades tucked  down toward the middle of your back. 3. Hold for __________ seconds. 4. Slowly return to the starting position. Repeat __________ times. Complete this exercise __________ times a day. Range-of-motion exercises Pendulum  1. Stand near a wall or a surface that you can hold onto for balance. 2. Bend at the waist and let your left / right arm hang straight down. Use your other arm to support you. Keep your back straight and do not lock your knees. 3. Relax your left / right arm and shoulder muscles, and move your hips and your trunk so your left / right arm swings freely. Your arm should swing because of the motion of your body, not because you are using your arm or shoulder muscles. 4. Keep moving your hips and trunk so your arm swings in the following directions, as told by your health care provider: ? Side to side. ? Forward and backward. ? In clockwise and counterclockwise circles. 5. Continue each motion for __________ seconds, or for as long as told by your health care provider. 6. Slowly return to the starting position. Repeat __________ times. Complete this exercise __________ times a day. Shoulder flexion, standing  1. Stand and hold a broomstick, a cane, or a similar object. Place your hands a little more than shoulder width apart on the object. Your left / right hand should be palm up, and your other hand should be palm down. 2. Keep your elbow straight and your shoulder muscles relaxed. Push the stick up with your healthy arm to   raise your left / right arm in front of your body, and then over your head until you feel a stretch in your shoulder (flexion). ? Avoid shrugging your shoulder while you raise your arm. Keep your shoulder blade tucked down toward the middle of your back. 3. Hold for __________ seconds. 4. Slowly return to the starting position. Repeat __________ times. Complete this exercise __________ times a day. Shoulder abduction, standing 1. Stand and hold a broomstick,  a cane, or a similar object. Place your hands a little more than shoulder width apart on the object. Your left / right hand should be palm up, and your other hand should be palm down. 2. Keep your elbow straight and your shoulder muscles relaxed. Push the object across your body toward your left / right side. Raise your left / right arm to the side of your body (abduction) until you feel a stretch in your shoulder. ? Do not raise your arm above shoulder height unless your health care provider tells you to do that. ? If directed, raise your arm over your head. ? Avoid shrugging your shoulder while you raise your arm. Keep your shoulder blade tucked down toward the middle of your back. 3. Hold for __________ seconds. 4. Slowly return to the starting position. Repeat __________ times. Complete this exercise __________ times a day. Internal rotation  1. Place your left / right hand behind your back, palm up. 2. Use your other hand to dangle an exercise band, a towel, or a similar object over your shoulder. Grasp the band with your left / right hand so you are holding on to both ends. 3. Gently pull up on the band until you feel a stretch in the front of your left / right shoulder. The movement of your arm toward the center of your body is called internal rotation. ? Avoid shrugging your shoulder while you raise your arm. Keep your shoulder blade tucked down toward the middle of your back. 4. Hold for __________ seconds. 5. Release the stretch by letting go of the band and lowering your hands. Repeat __________ times. Complete this exercise __________ times a day. Strengthening exercises External rotation  1. Sit in a stable chair without armrests. 2. Secure an exercise band to a stable object at elbow height on your left / right side. 3. Place a soft object, such as a folded towel or a small pillow, between your left / right upper arm and your body to move your elbow about 4 inches (10 cm) away  from your side. 4. Hold the end of the exercise band so it is tight and there is no slack. 5. Keeping your elbow pressed against the soft object, slowly move your forearm out, away from your abdomen (external rotation). Keep your body steady so only your forearm moves. 6. Hold for __________ seconds. 7. Slowly return to the starting position. Repeat __________ times. Complete this exercise __________ times a day. Shoulder abduction  1. Sit in a stable chair without armrests, or stand up. 2. Hold a __________ weight in your left / right hand, or hold an exercise band with both hands. 3. Start with your arms straight down and your left / right palm facing in, toward your body. 4. Slowly lift your left / right hand out to your side (abduction). Do not lift your hand above shoulder height unless your health care provider tells you that this is safe. ? Keep your arms straight. ? Avoid shrugging your shoulder while you   do this movement. Keep your shoulder blade tucked down toward the middle of your back. 5. Hold for __________ seconds. 6. Slowly lower your arm, and return to the starting position. Repeat __________ times. Complete this exercise __________ times a day. Shoulder extension 1. Sit in a stable chair without armrests, or stand up. 2. Secure an exercise band to a stable object in front of you so it is at shoulder height. 3. Hold one end of the exercise band in each hand. Your palms should face each other. 4. Straighten your elbows and lift your hands up to shoulder height. 5. Step back, away from the secured end of the exercise band, until the band is tight and there is no slack. 6. Squeeze your shoulder blades together as you pull your hands down to the sides of your thighs (extension). Stop when your hands are straight down by your sides. Do not let your hands go behind your body. 7. Hold for __________ seconds. 8. Slowly return to the starting position. Repeat __________ times.  Complete this exercise __________ times a day. Shoulder row 1. Sit in a stable chair without armrests, or stand up. 2. Secure an exercise band to a stable object in front of you so it is at waist height. 3. Hold one end of the exercise band in each hand. Position your palms so that your thumbs are facing the ceiling (neutral position). 4. Bend each of your elbows to a 90-degree angle (right angle) and keep your upper arms at your sides. 5. Step back until the band is tight and there is no slack. 6. Slowly pull your elbows back behind you. 7. Hold for __________ seconds. 8. Slowly return to the starting position. Repeat __________ times. Complete this exercise __________ times a day. Shoulder press-ups  1. Sit in a stable chair that has armrests. Sit upright, with your feet flat on the floor. 2. Put your hands on the armrests so your elbows are bent and your fingers are pointing forward. Your hands should be about even with the sides of your body. 3. Push down on the armrests and use your arms to lift yourself off the chair. Straighten your elbows and lift yourself up as much as you comfortably can. ? Move your shoulder blades down, and avoid letting your shoulders move up toward your ears. ? Keep your feet on the ground. As you get stronger, your feet should support less of your body weight as you lift yourself up. 4. Hold for __________ seconds. 5. Slowly lower yourself back into the chair. Repeat __________ times. Complete this exercise __________ times a day. Wall push-ups  1. Stand so you are facing a stable wall. Your feet should be about one arm-length away from the wall. 2. Lean forward and place your palms on the wall at shoulder height. 3. Keep your feet flat on the floor as you bend your elbows and lean forward toward the wall. 4. Hold for __________ seconds. 5. Straighten your elbows to push yourself back to the starting position. Repeat __________ times. Complete this exercise  __________ times a day. This information is not intended to replace advice given to you by your health care provider. Make sure you discuss any questions you have with your health care provider. Document Revised: 02/05/2019 Document Reviewed: 11/13/2018 Elsevier Patient Education  2020 [employer]  

## 2020-06-23 NOTE — Progress Notes (Signed)
Subjective:     Patient ID: Kathryn Farrell, female   DOB: 11-27-1960, 59 y.o.   MRN: [id number]  HPI 59 year old female with primary complaint of left greater than right shoulder pain.  She has a long history of left shoulder pain and had a left shoulder MRI performed in 2020 which demonstrated left glenohumeral osteoarthritis as well as left supraspinatus tear.  Over the last several weeks she has developed right shoulder pain which occurs mainly with reaching activities as well as reaching behind her back or behind her head She does have some nighttime pain with the right upper extremity She has had no recent falls or trauma Her activity level has been higher than usual due to preparation for an upcoming missionary trip to [place].  She plans to be in [place] for at least 10 days. Pain Inventory Average Pain 9 Pain Right Now 9 My pain is constant, sharp, burning, stabbing, aching and Throbs  In the last 24 hours, has pain interfered with the following? General activity 9 Relation with others 5 Enjoyment of life 7 What TIME of day is your pain at its worst? varies Sleep (in general) Poor  Pain is worse with: walking, bending, sitting and standing Pain improves with: medication and injections Relief from Meds: good relief  Family History  Problem Relation Age of Onset  . COPD Mother   . Asthma Mother   . Diabetes Brother   . Stroke Brother   . Hypertension Brother    Social History   Socioeconomic History  . Marital status: Married    Spouse name: Not on file  . Number of children: Not on file  . Years of education: Not on file  . Highest education level: Not on file  Occupational History  . Not on file  Tobacco Use  . Smoking status: Former Smoker    Quit date: 10/28/1996    Years since quitting: 23.6  . Smokeless tobacco: Never Used  Vaping Use  . Vaping Use: Never used  Substance and Sexual Activity  . Alcohol use: No  . Drug use: No  . Sexual activity: Not Currently     Birth control/protection: Surgical    Comment: Hysterectomy  Other Topics Concern  . Not on file  Social History Narrative  . Not on file   Social Determinants of Health   Financial Resource Strain:   . Difficulty of Paying Living Expenses: Not on file  Food Insecurity:   . Worried About Programme researcher, broadcasting/film/video in the Last Year: Not on file  . Ran Out of Food in the Last Year: Not on file  Transportation Needs:   . Lack of Transportation (Medical): Not on file  . Lack of Transportation (Non-Medical): Not on file  Physical Activity:   . Days of Exercise per Week: Not on file  . Minutes of Exercise per Session: Not on file  Stress:   . Feeling of Stress : Not on file  Social Connections:   . Frequency of Communication with Friends and Family: Not on file  . Frequency of Social Gatherings with Friends and Family: Not on file  . Attends Religious Services: Not on file  . Active Member of Clubs or Organizations: Not on file  . Attends Banker Meetings: Not on file  . Marital Status: Not on file   Past Surgical History:  Procedure Laterality Date  . ABDOMINAL HYSTERECTOMY    . arm surgery     "muscle came off the  elbow"  . AUGMENTATION MAMMAPLASTY    . CESAREAN SECTION    . CHOLECYSTECTOMY    . COLONOSCOPY  2008  . ENDOSCOPIC CONCHA BULLOSA RESECTION Left 01/25/2020   Procedure: ENDOSCOPIC CONCHA BULLOSA RESECTION;  Surgeon: Geanie Logan, MD;  Location: [facility];  Service: ENT;  Laterality: Left;  . IMAGE GUIDED SINUS SURGERY Left 01/25/2020   Procedure: IMAGE GUIDED SINUS SURGERY;  Surgeon: Geanie Logan, MD;  Location: [facility];  Service: ENT;  Laterality: Left;  PLACED DISK ON OR CHARGE NURSE DESK 3-18  KP  . KNEE ARTHROSCOPY  2004/2008   x2  . MAXILLARY ANTROSTOMY Left 01/25/2020   Procedure: MAXILLARY ANTROSTOMY with tissue;  Surgeon: Geanie Logan, MD;  Location: [facility];  Service: ENT;  Laterality: Left;  needs styker disk   . NECK SURGERY    . SPINE SURGERY  2008   cervical fusion  . TOTAL ABDOMINAL HYSTERECTOMY W/ BILATERAL SALPINGOOPHORECTOMY  1989  . UPPER GI ENDOSCOPY  04-27-13   Dr [name]   Past Surgical History:  Procedure Laterality Date  . ABDOMINAL HYSTERECTOMY    . arm surgery     "muscle came off the elbow"  . AUGMENTATION MAMMAPLASTY    . CESAREAN SECTION    . CHOLECYSTECTOMY    . COLONOSCOPY  2008  . ENDOSCOPIC CONCHA BULLOSA RESECTION Left 01/25/2020   Procedure: ENDOSCOPIC CONCHA BULLOSA RESECTION;  Surgeon: Geanie Logan, MD;  Location: [facility];  Service: ENT;  Laterality: Left;  . IMAGE GUIDED SINUS SURGERY Left 01/25/2020   Procedure: IMAGE GUIDED SINUS SURGERY;  Surgeon: Geanie Logan, MD;  Location: [facility];  Service: ENT;  Laterality: Left;  PLACED DISK ON OR CHARGE NURSE DESK 3-18  KP  . KNEE ARTHROSCOPY  2004/2008   x2  . MAXILLARY ANTROSTOMY Left 01/25/2020   Procedure: MAXILLARY ANTROSTOMY with tissue;  Surgeon: Geanie Logan, MD;  Location: [facility];  Service: ENT;  Laterality: Left;  needs styker disk  . NECK SURGERY    . SPINE SURGERY  2008   cervical fusion  . TOTAL ABDOMINAL HYSTERECTOMY W/ BILATERAL SALPINGOOPHORECTOMY  1989  . UPPER GI ENDOSCOPY  04-27-13   Dr [name]   Past Medical History:  Diagnosis Date  . Arthritis    seronegative inflammatory arthritis/cervical disc disease  . C. difficile colitis 2008  . Chronic cholecystitis 05/07/2013  . Colon polyps 2008  . Complication of anesthesia    BP drops very low  . COVID-19 affecting pregnancy in third trimester 10/28/2019   had negative test first week of March 2021  . GERD (gastroesophageal reflux disease)   . Headache    sinus  . Hiatal hernia 2014  . Hypercholesterolemia   . Mild depression (HCC)   . Neuromuscular disorder (HCC) 2009   centralized pain syndrome/fibromylagia  . Osteoporosis   . PONV (postoperative nausea and vomiting)    BP (!) 91/59   Pulse 60   Temp  98.2 F (36.8 C)   Ht 4' 11.5" (1.511 m)   Wt 116 lb (52.6 kg)   SpO2 97%   BMI 23.04 kg/m   Opioid Risk Score:   Fall Risk Score:  `1  Depression screen PHQ 2/9  Depression screen [facility] 2/9 06/23/2020 03/03/2020 09/11/2016 06/24/2016 04/24/2016 01/03/2016 12/18/2015  Decreased Interest 1 1 1 1  0 3 3  Down, Depressed, Hopeless 1 1 1 1  0 2 2  PHQ - 2 Score 2 2 2 2  0 5 5  Altered  sleeping - - - - - - -  Tired, decreased energy - - - - - - -  Change in appetite - - - - - - -  Feeling bad or failure about yourself  - - - - - - -  Trouble concentrating - - - - - - -  Moving slowly or fidgety/restless - - - - - - -  Suicidal thoughts - - - - - - -  PHQ-9 Score - - - - - - -   Review of Systems  Constitutional: Negative.   HENT: Negative.   Eyes: Negative.   Respiratory: Negative.   Cardiovascular: Negative.   Gastrointestinal: Negative.   Endocrine: Negative.   Genitourinary: Negative.   Musculoskeletal:       Bilateral shoulder pain  Skin: Negative.   Allergic/Immunologic: Negative.   Neurological: Negative.   Hematological: Negative.   Psychiatric/Behavioral: Negative.   All other systems reviewed and are negative.      Objective:   Physical Exam Is in no acute distress Cervical spine range of motion is reduced with flexion extension lateral bending rotation status post multilevel fusion There is left shoulder abduction to 45 degrees right shoulder abduction to 90 degrees accompanied by pain. Left shoulder has limited external rotation as well as forward flexion and abduction but no crepitus appreciated Right shoulder without crepitus or clicking there is good range of motion passively with abduction as well as forward flexion. Patient is unable to reach behind her head or behind her back with either hand.  This is inhibited by pain. Motor strength is 5/5 bilateral deltoid bicep tricep grip     Assessment:     #1.  Bilateral shoulder pain with documented left glenohumeral  arthritis as well as left supraspinatus tear.  I suspect that she has rotator cuff impingement on the right side given more recent onset    Plan:     The patient has a trip planned to [place] in approximately 1 week.  She has been given shoulder exercises but will do bilateral corticosteroid injections given the presence of night pain plus limitation of activity   Shoulder injection glenohumeral joint   Indication: Bilateral shoulder pain not relieved by medication management and other conservative care.  Informed consent was obtained after describing risks and benefits of the procedure with the patient, this includes bleeding, bruising, infection and medication side effects. The patient wishes to proceed and has given written consent. Patient was placed in a seated position. The right and left shoulder was marked and prepped with betadine in the subacromial area. A 25-gauge 1-1/2 inch needle was inserted into the subacromial area. After negative draw back for blood, a solution containing 1 mL of 6 mg per ML betamethasone and 4 mL of 1% lidocaine was injected. A band aid was applied. The patient tolerated the procedure well. Post procedure instructions were given.  Patient will follow up with nurse practitioner beginning of October

## 2020-06-27 ENCOUNTER — Telehealth: Payer: Self-pay

## 2020-06-27 NOTE — Telephone Encounter (Signed)
Kathryn Farrell is leaving for [place] on 07/02/2020. She wanted to know if you could give he a deep back injection, before her trip?   Call back ph [phone].

## 2020-06-27 NOTE — Telephone Encounter (Signed)
May set up for bilateral L3-L4-L5 medial branch blocks if I have openings before that time

## 2020-06-28 ENCOUNTER — Encounter: Payer: 59 | Attending: Registered Nurse | Admitting: Registered Nurse

## 2020-06-28 ENCOUNTER — Encounter: Payer: Self-pay | Admitting: Registered Nurse

## 2020-06-28 ENCOUNTER — Other Ambulatory Visit: Payer: Self-pay

## 2020-06-28 VITALS — BP 96/68 | HR 73 | Temp 98.8°F | Ht 59.5 in | Wt 117.0 lb

## 2020-06-28 DIAGNOSIS — M961 Postlaminectomy syndrome, not elsewhere classified: Secondary | ICD-10-CM | POA: Diagnosis present

## 2020-06-28 DIAGNOSIS — M5412 Radiculopathy, cervical region: Secondary | ICD-10-CM | POA: Diagnosis present

## 2020-06-28 DIAGNOSIS — Z79891 Long term (current) use of opiate analgesic: Secondary | ICD-10-CM | POA: Insufficient documentation

## 2020-06-28 DIAGNOSIS — M19012 Primary osteoarthritis, left shoulder: Secondary | ICD-10-CM

## 2020-06-28 DIAGNOSIS — G890 Central pain syndrome: Secondary | ICD-10-CM | POA: Insufficient documentation

## 2020-06-28 DIAGNOSIS — M542 Cervicalgia: Secondary | ICD-10-CM

## 2020-06-28 DIAGNOSIS — M7541 Impingement syndrome of right shoulder: Secondary | ICD-10-CM

## 2020-06-28 DIAGNOSIS — F418 Other specified anxiety disorders: Secondary | ICD-10-CM | POA: Insufficient documentation

## 2020-06-28 DIAGNOSIS — G894 Chronic pain syndrome: Secondary | ICD-10-CM | POA: Diagnosis present

## 2020-06-28 DIAGNOSIS — M47817 Spondylosis without myelopathy or radiculopathy, lumbosacral region: Secondary | ICD-10-CM | POA: Insufficient documentation

## 2020-06-28 DIAGNOSIS — M47812 Spondylosis without myelopathy or radiculopathy, cervical region: Secondary | ICD-10-CM | POA: Diagnosis present

## 2020-06-28 DIAGNOSIS — M609 Myositis, unspecified: Secondary | ICD-10-CM | POA: Insufficient documentation

## 2020-06-28 DIAGNOSIS — M797 Fibromyalgia: Secondary | ICD-10-CM

## 2020-06-28 DIAGNOSIS — F341 Dysthymic disorder: Secondary | ICD-10-CM | POA: Insufficient documentation

## 2020-06-28 DIAGNOSIS — Z5181 Encounter for therapeutic drug level monitoring: Secondary | ICD-10-CM | POA: Insufficient documentation

## 2020-06-28 DIAGNOSIS — M47816 Spondylosis without myelopathy or radiculopathy, lumbar region: Secondary | ICD-10-CM

## 2020-06-28 DIAGNOSIS — Z79899 Other long term (current) drug therapy: Secondary | ICD-10-CM | POA: Insufficient documentation

## 2020-06-28 DIAGNOSIS — M1712 Unilateral primary osteoarthritis, left knee: Secondary | ICD-10-CM

## 2020-06-28 MED ORDER — HYDROCODONE-ACETAMINOPHEN 10-325 MG PO TABS
1.0000 | ORAL_TABLET | Freq: Four times a day (QID) | ORAL | 0 refills | Status: DC | PRN
Start: 2020-06-28 — End: 2020-08-29

## 2020-06-28 MED ORDER — HYDROCODONE-ACETAMINOPHEN 10-325 MG PO TABS: 1.0000 | ORAL_TABLET | Freq: Four times a day (QID) | ORAL | 0 refills | Status: DC | PRN
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
## 2020-06-29 ENCOUNTER — Telehealth: Payer: Self-pay

## 2020-06-29 NOTE — Telephone Encounter (Signed)
Pre-Procedure instructions reviewed. Patient understood. Copy of form mailed to patient.

## 2020-06-29 NOTE — Telephone Encounter (Signed)
Message left for patient to call back. Or will call her back to complete the Pre-Procedure form. Pending Bilateral L3L4 L5 Medial branch block on  08/03/2020.

## 2020-07-21 ENCOUNTER — Telehealth: Payer: Self-pay | Admitting: Physical Medicine & Rehabilitation

## 2020-07-21 NOTE — Telephone Encounter (Signed)
Would wait until 11/27 to schedule repeat shoulder injections

## 2020-07-21 NOTE — Telephone Encounter (Signed)
Kathryn Farrell said she would like another shoulder injection, her right shoulder is acting up, please advise if we my schedule this injection and what would be a good time frame. She is coming in on 10/7 for lumbar MBB. Her last bilateral shoulder injection was 8/27.

## 2020-07-25 NOTE — Telephone Encounter (Signed)
Notified. 

## 2020-08-03 ENCOUNTER — Encounter: Payer: Medicare HMO | Admitting: Physical Medicine & Rehabilitation

## 2020-08-08 ENCOUNTER — Other Ambulatory Visit: Payer: Self-pay

## 2020-08-08 ENCOUNTER — Encounter: Payer: Self-pay | Admitting: Dermatology

## 2020-08-08 ENCOUNTER — Ambulatory Visit (INDEPENDENT_AMBULATORY_CARE_PROVIDER_SITE_OTHER): Payer: Self-pay | Admitting: Dermatology

## 2020-08-08 DIAGNOSIS — L988 Other specified disorders of the skin and subcutaneous tissue: Secondary | ICD-10-CM

## 2020-08-08 NOTE — Progress Notes (Signed)
   Follow-Up Visit   Subjective  Kathryn Farrell is a 59 y.o. female who presents for the following: Facial Elastosis.  Patient concerned about areas at forehead, crow's feet and under eyes. Also concerned about finer lines around the mouth including a deeper vertical line at the upper lip.  The following portions of the chart were reviewed this encounter and updated as appropriate:  Tobacco  Allergies  Meds  Problems  Med Hx  Surg Hx  Fam Hx      Review of Systems:  No other skin or systemic complaints except as noted in HPI or Assessment and Plan.  Objective  Well appearing patient in no apparent distress; mood and affect are within normal limits.  A focused examination was performed including face. Relevant physical exam findings are noted in the Assessment and Plan.  Objective  Face: Rhytides and volume loss.    Assessment & Plan  Elastosis of skin Face  Patient advised that surgery is the better option for under eyes.  For finer lines of the face especially for around the mouth recommend Halo (resurfacing) laser.    Recommend 10 units Botox across the forehead, 9 units each at crows feet, Volbella or Restylane Silk 1 syringe to under eyes to camouflage shadowing and to vertical line at upper cutaneous lip, one syringe Juvederm Ultra or Restylane Defyne to nasolabial folds.  Reviewed expected improvement.and that each of these will offer some improvement but not complete resolution at the area.  She is concerned about cost.  Discussed prioritizing which areas are likely to give her the most overall improvement for the least amount of money.  However, she feels strongly she would like to treat all areas at once.  Advised that she should not travel for 1 to 2 weeks after having filler and that it takes 1-2 weeks for botox to show improvement. Advised that botox wears off in 3-4 months and filler lasts 6-12 months.   No follow-ups on file.  Anise Salvo, RMA, am  acting as scribe for Darden Dates, MD .  Documentation: I have reviewed the above documentation for accuracy and completeness, and I agree with the above.  Darden Dates, MD

## 2020-08-15 ENCOUNTER — Encounter: Payer: Medicare HMO | Attending: Registered Nurse | Admitting: Physical Medicine & Rehabilitation

## 2020-08-15 ENCOUNTER — Other Ambulatory Visit: Payer: Self-pay

## 2020-08-15 ENCOUNTER — Encounter: Payer: Self-pay | Admitting: Physical Medicine & Rehabilitation

## 2020-08-15 VITALS — BP 111/73 | HR 66 | Temp 98.9°F | Ht <= 58 in | Wt 114.2 lb

## 2020-08-15 DIAGNOSIS — M47816 Spondylosis without myelopathy or radiculopathy, lumbar region: Secondary | ICD-10-CM | POA: Insufficient documentation

## 2020-08-15 NOTE — Patient Instructions (Signed)

## 2020-08-15 NOTE — Progress Notes (Signed)
Bilateral Lumbar L3, L4  medial branch blocks and L 5 dorsal ramus injection under fluoroscopic guidance  Indication: Lumbar pain which is not relieved by medication management or other conservative care and interfering with self-care and mobility.  Informed consent was obtained after describing risks and benefits of the procedure with the patient, this includes bleeding, infection, paralysis and medication side effects.  The patient wishes to proceed and has given written consent.  The patient was placed in prone position.  The lumbar area was marked and prepped with Betadine.  One mL of 1% lidocaine was injected into each of 6 areas into the skin and subcutaneous tissue.  Then a 22-gauge 3.5in spinal needle was inserted targeting the junction of the left S1 superior articular process and sacral ala junction. Needle was advanced under fluoroscopic guidance.  Bone contact was made.  Isovue 200 was injected x 0.5 mL demonstrating no intravascular uptake.  Then a solution  of 2% MPF lidocaine was injected x 0.5 mL.  Then the left L5 superior articular process in transverse process junction was targeted.  Bone contact was made.  Isovue 200 was injected x 0.5 mL demonstrating no intravascular uptake. Then a solution containing  2% MPF lidocaine was injected x 0.5 mL.  Then the left L4 superior articular process in transverse process junction was targeted.  Bone contact was made.  Isovue 200 was injected x 0.5 mL demonstrating no intravascular uptake.  Then a solution containing2% MPF lidocaine was injected x 0.5 mL.  This same procedure was performed on the right side using the same needle, technique and injectate.  Patient tolerated procedure well.  Post procedure instructions were given.  Preinjection pain level  Postinjection pain level 0/10

## 2020-08-15 NOTE — Progress Notes (Signed)
  PROCEDURE RECORD Kathryn Farrell   Name: Kathryn Farrell DOB:12/10/1960 MRN: [id number]  Date:08/15/2020  Physician: Claudette Laws, MD    Nurse/CMA: Nedra Hai CMA Allergies:  Allergies  Allergen Reactions  . Lodine [Etodolac] Nausea And Vomiting  . Penicillins Other (See Comments)    Was a child; doesn't remember.  . Trileptal [Oxcarbazepine] Other (See Comments)    Chest pain  . Venlafaxine     Other reaction(s): Makes her jittery  . Zanaflex [Tizanidine Hcl]     Other reaction(s): Anxious, heart flutter  . Effexor [Venlafaxine Hydrochloride]     Makes her jittery  . Gabapentin Palpitations    Chest Pains  . Tegretol [Carbamazepine] Hives and Rash    Other reaction(s): Chest pains, numbness, tingling.  Itching tingling    Consent Signed: Yes.    Is patient diabetic? No.  CBG today?   Pregnant: No. LMP: No LMP recorded. Patient has had a hysterectomy. (age [age])  Anticoagulants: no Anti-inflammatory: no Antibiotics: no  Procedure:Bilateral L 3-4-5 medial branch blocks Position: Prone Start Time: 12:44pm End Time:12:56pm  Fluoro Time:46  RN/CMA Keilyn Nadal RN Nedra Hai CMA    Time 1217 1:04pm    BP 111/73 97/66    Pulse 66 78    Respirations 14 14    O2 Sat 95 94    S/S 6 6    Pain Level 8/10 0/10     D/C home with self, patient A & O X 3, D/C instructions reviewed, and sits independently.

## 2020-08-29 ENCOUNTER — Other Ambulatory Visit: Payer: Self-pay

## 2020-08-29 ENCOUNTER — Encounter: Payer: 59 | Attending: Registered Nurse | Admitting: Registered Nurse

## 2020-08-29 ENCOUNTER — Encounter: Payer: Self-pay | Admitting: Registered Nurse

## 2020-08-29 VITALS — BP 96/57 | HR 72 | Temp 98.3°F | Ht <= 58 in | Wt 118.0 lb

## 2020-08-29 DIAGNOSIS — M170 Bilateral primary osteoarthritis of knee: Secondary | ICD-10-CM | POA: Diagnosis present

## 2020-08-29 DIAGNOSIS — M961 Postlaminectomy syndrome, not elsewhere classified: Secondary | ICD-10-CM | POA: Diagnosis present

## 2020-08-29 DIAGNOSIS — G894 Chronic pain syndrome: Secondary | ICD-10-CM | POA: Insufficient documentation

## 2020-08-29 DIAGNOSIS — M19011 Primary osteoarthritis, right shoulder: Secondary | ICD-10-CM | POA: Insufficient documentation

## 2020-08-29 DIAGNOSIS — M542 Cervicalgia: Secondary | ICD-10-CM | POA: Diagnosis present

## 2020-08-29 DIAGNOSIS — Z79891 Long term (current) use of opiate analgesic: Secondary | ICD-10-CM

## 2020-08-29 DIAGNOSIS — M47812 Spondylosis without myelopathy or radiculopathy, cervical region: Secondary | ICD-10-CM | POA: Insufficient documentation

## 2020-08-29 DIAGNOSIS — Z5181 Encounter for therapeutic drug level monitoring: Secondary | ICD-10-CM | POA: Insufficient documentation

## 2020-08-29 DIAGNOSIS — M797 Fibromyalgia: Secondary | ICD-10-CM | POA: Insufficient documentation

## 2020-08-29 DIAGNOSIS — M47816 Spondylosis without myelopathy or radiculopathy, lumbar region: Secondary | ICD-10-CM | POA: Diagnosis present

## 2020-08-29 DIAGNOSIS — M19012 Primary osteoarthritis, left shoulder: Secondary | ICD-10-CM | POA: Diagnosis present

## 2020-08-29 MED ORDER — HYDROCODONE-ACETAMINOPHEN 10-325 MG PO TABS
1.0000 | ORAL_TABLET | Freq: Four times a day (QID) | ORAL | 0 refills | Status: DC | PRN
Start: 2020-08-29 — End: 2020-10-31

## 2020-08-29 MED ORDER — HYDROCODONE-ACETAMINOPHEN 10-325 MG PO TABS
1.0000 | ORAL_TABLET | Freq: Four times a day (QID) | ORAL | 0 refills | Status: DC | PRN
Start: 2020-08-29 — End: 2020-08-29
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
## 2020-08-30 ENCOUNTER — Ambulatory Visit: Payer: 59 | Admitting: Dermatology

## 2020-09-02 LAB — DRUG TOX MONITOR 1 W/CONF, ORAL FLD
Alprazolam: 0.7 ng/mL — ABNORMAL HIGH (ref <0.50)
Amphetamines: NEGATIVE ng/mL (ref <10)
Barbiturates: NEGATIVE ng/mL (ref <10)
Benzodiazepines: POSITIVE ng/mL — AB (ref <0.50)
Buprenorphine: NEGATIVE ng/mL (ref <0.10)
Chlordiazepoxide: NEGATIVE ng/mL (ref <0.50)
Clonazepam: NEGATIVE ng/mL (ref <0.50)
Cocaine: NEGATIVE ng/mL (ref <5.0)
Codeine: NEGATIVE ng/mL (ref <2.5)
Diazepam: NEGATIVE ng/mL (ref <0.50)
Dihydrocodeine: 6.9 ng/mL — ABNORMAL HIGH (ref <2.5)
Fentanyl: NEGATIVE ng/mL (ref <0.10)
Flunitrazepam: NEGATIVE ng/mL (ref <0.50)
Flurazepam: NEGATIVE ng/mL (ref <0.50)
Heroin Metabolite: NEGATIVE ng/mL (ref <1.0)
Hydrocodone: 74.6 ng/mL — ABNORMAL HIGH (ref <2.5)
Hydromorphone: NEGATIVE ng/mL (ref <2.5)
Lorazepam: NEGATIVE ng/mL (ref <0.50)
MARIJUANA: NEGATIVE ng/mL (ref <2.5)
MDMA: NEGATIVE ng/mL (ref <10)
Meprobamate: NEGATIVE ng/mL (ref <2.5)
Methadone: NEGATIVE ng/mL (ref <5.0)
Midazolam: NEGATIVE ng/mL (ref <0.50)
Morphine: NEGATIVE ng/mL (ref <2.5)
Nicotine Metabolite: NEGATIVE ng/mL (ref <5.0)
Nordiazepam: NEGATIVE ng/mL (ref <0.50)
Norhydrocodone: 4.9 ng/mL — ABNORMAL HIGH (ref <2.5)
Noroxycodone: NEGATIVE ng/mL (ref <2.5)
Opiates: POSITIVE ng/mL — AB (ref <2.5)
Oxazepam: NEGATIVE ng/mL (ref <0.50)
Oxycodone: NEGATIVE ng/mL (ref <2.5)
Oxymorphone: NEGATIVE ng/mL (ref <2.5)
Phencyclidine: NEGATIVE ng/mL (ref <10)
Tapentadol: NEGATIVE ng/mL (ref <5.0)
Temazepam: NEGATIVE ng/mL (ref <0.50)
Tramadol: NEGATIVE ng/mL (ref <5.0)
Triazolam: NEGATIVE ng/mL (ref <0.50)
Zolpidem: NEGATIVE ng/mL (ref <5.0)

## 2020-09-02 LAB — DRUG TOX ALC METAB W/CON, ORAL FLD: Alcohol Metabolite: NEGATIVE ng/mL (ref <25)

## 2020-09-08 ENCOUNTER — Telehealth: Payer: Self-pay | Admitting: *Deleted

## 2020-09-08 NOTE — Telephone Encounter (Signed)
Oral swab drug screen was consistent for prescribed medications.  ?

## 2020-10-11 ENCOUNTER — Telehealth: Payer: Self-pay

## 2020-10-11 NOTE — Telephone Encounter (Signed)
Patient called stating she had total shoulder replacement and Dr. Collins Scotland the surgeon gave her pain meds and she needs a refill. He wants to know if it ok. I called patient back and explained to her that she is to put the pain meds to the side that Riley Lam gives her to the side and not take and take what the surgeon gives her while under his care.

## 2020-10-31 ENCOUNTER — Other Ambulatory Visit: Payer: Self-pay

## 2020-10-31 ENCOUNTER — Encounter: Payer: 59 | Attending: Registered Nurse | Admitting: Registered Nurse

## 2020-10-31 ENCOUNTER — Encounter: Payer: Self-pay | Admitting: Registered Nurse

## 2020-10-31 VITALS — BP 102/55 | HR 73 | Temp 98.4°F | Ht <= 58 in | Wt 120.2 lb

## 2020-10-31 DIAGNOSIS — M961 Postlaminectomy syndrome, not elsewhere classified: Secondary | ICD-10-CM | POA: Insufficient documentation

## 2020-10-31 DIAGNOSIS — G894 Chronic pain syndrome: Secondary | ICD-10-CM | POA: Insufficient documentation

## 2020-10-31 DIAGNOSIS — Z79891 Long term (current) use of opiate analgesic: Secondary | ICD-10-CM | POA: Diagnosis present

## 2020-10-31 DIAGNOSIS — M47812 Spondylosis without myelopathy or radiculopathy, cervical region: Secondary | ICD-10-CM | POA: Diagnosis present

## 2020-10-31 DIAGNOSIS — M19011 Primary osteoarthritis, right shoulder: Secondary | ICD-10-CM | POA: Diagnosis present

## 2020-10-31 DIAGNOSIS — Z5181 Encounter for therapeutic drug level monitoring: Secondary | ICD-10-CM | POA: Insufficient documentation

## 2020-10-31 DIAGNOSIS — M797 Fibromyalgia: Secondary | ICD-10-CM | POA: Diagnosis present

## 2020-10-31 DIAGNOSIS — M170 Bilateral primary osteoarthritis of knee: Secondary | ICD-10-CM | POA: Insufficient documentation

## 2020-10-31 DIAGNOSIS — M19012 Primary osteoarthritis, left shoulder: Secondary | ICD-10-CM | POA: Diagnosis present

## 2020-10-31 DIAGNOSIS — M47816 Spondylosis without myelopathy or radiculopathy, lumbar region: Secondary | ICD-10-CM | POA: Insufficient documentation

## 2020-10-31 DIAGNOSIS — M542 Cervicalgia: Secondary | ICD-10-CM | POA: Insufficient documentation

## 2020-10-31 MED ORDER — HYDROCODONE-ACETAMINOPHEN 10-325 MG PO TABS: 1.0000 | ORAL_TABLET | Freq: Four times a day (QID) | ORAL | 0 refills | Status: DC | PRN

## 2020-10-31 MED ORDER — HYDROCODONE-ACETAMINOPHEN 10-325 MG PO TABS
1.0000 | ORAL_TABLET | Freq: Four times a day (QID) | ORAL | 0 refills | Status: DC | PRN
Start: 2020-10-31 — End: 2020-10-31
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
## 2020-11-22 ENCOUNTER — Other Ambulatory Visit (HOSPITAL_COMMUNITY): Payer: Self-pay | Admitting: Student

## 2020-11-22 ENCOUNTER — Other Ambulatory Visit: Payer: Self-pay | Admitting: Student

## 2020-11-22 DIAGNOSIS — M25562 Pain in left knee: Secondary | ICD-10-CM

## 2020-11-22 DIAGNOSIS — M94269 Chondromalacia, unspecified knee: Secondary | ICD-10-CM

## 2020-11-22 DIAGNOSIS — M25869 Other specified joint disorders, unspecified knee: Secondary | ICD-10-CM

## 2020-11-22 DIAGNOSIS — M170 Bilateral primary osteoarthritis of knee: Secondary | ICD-10-CM

## 2020-11-22 DIAGNOSIS — G8929 Other chronic pain: Secondary | ICD-10-CM

## 2020-11-28 ENCOUNTER — Ambulatory Visit
Admission: RE | Admit: 2020-11-28 | Discharge: 2020-11-28 | Disposition: A | Discharge status: Home / Self Care | Payer: Medicare HMO | Source: Ambulatory Visit | Attending: Student | Admitting: Student

## 2020-11-28 ENCOUNTER — Other Ambulatory Visit: Payer: Self-pay

## 2020-11-28 DIAGNOSIS — M25869 Other specified joint disorders, unspecified knee: Secondary | ICD-10-CM | POA: Insufficient documentation

## 2020-11-28 DIAGNOSIS — M170 Bilateral primary osteoarthritis of knee: Secondary | ICD-10-CM

## 2020-11-28 DIAGNOSIS — M94269 Chondromalacia, unspecified knee: Secondary | ICD-10-CM | POA: Diagnosis present

## 2020-11-28 DIAGNOSIS — M25561 Pain in right knee: Secondary | ICD-10-CM | POA: Insufficient documentation

## 2020-11-28 DIAGNOSIS — M25562 Pain in left knee: Secondary | ICD-10-CM | POA: Diagnosis present

## 2020-11-28 DIAGNOSIS — G8929 Other chronic pain: Secondary | ICD-10-CM

## 2020-12-29 ENCOUNTER — Encounter: Payer: Self-pay | Admitting: Registered Nurse

## 2020-12-29 ENCOUNTER — Other Ambulatory Visit: Payer: Self-pay

## 2020-12-29 ENCOUNTER — Encounter: Payer: BC Managed Care – PPO | Attending: Registered Nurse | Admitting: Registered Nurse

## 2020-12-29 VITALS — BP 92/62 | HR 66 | Temp 98.6°F | Ht <= 58 in | Wt 121.4 lb

## 2020-12-29 DIAGNOSIS — M47816 Spondylosis without myelopathy or radiculopathy, lumbar region: Secondary | ICD-10-CM | POA: Insufficient documentation

## 2020-12-29 DIAGNOSIS — M19012 Primary osteoarthritis, left shoulder: Secondary | ICD-10-CM | POA: Diagnosis present

## 2020-12-29 DIAGNOSIS — M542 Cervicalgia: Secondary | ICD-10-CM | POA: Insufficient documentation

## 2020-12-29 DIAGNOSIS — M797 Fibromyalgia: Secondary | ICD-10-CM | POA: Diagnosis present

## 2020-12-29 DIAGNOSIS — M19011 Primary osteoarthritis, right shoulder: Secondary | ICD-10-CM | POA: Diagnosis present

## 2020-12-29 DIAGNOSIS — M7062 Trochanteric bursitis, left hip: Secondary | ICD-10-CM | POA: Insufficient documentation

## 2020-12-29 DIAGNOSIS — M961 Postlaminectomy syndrome, not elsewhere classified: Secondary | ICD-10-CM | POA: Diagnosis present

## 2020-12-29 DIAGNOSIS — M170 Bilateral primary osteoarthritis of knee: Secondary | ICD-10-CM | POA: Insufficient documentation

## 2020-12-29 DIAGNOSIS — G8929 Other chronic pain: Secondary | ICD-10-CM

## 2020-12-29 DIAGNOSIS — M47812 Spondylosis without myelopathy or radiculopathy, cervical region: Secondary | ICD-10-CM | POA: Insufficient documentation

## 2020-12-29 DIAGNOSIS — Z79891 Long term (current) use of opiate analgesic: Secondary | ICD-10-CM | POA: Insufficient documentation

## 2020-12-29 DIAGNOSIS — M7061 Trochanteric bursitis, right hip: Secondary | ICD-10-CM | POA: Insufficient documentation

## 2020-12-29 DIAGNOSIS — G894 Chronic pain syndrome: Secondary | ICD-10-CM | POA: Diagnosis present

## 2020-12-29 DIAGNOSIS — Z5181 Encounter for therapeutic drug level monitoring: Secondary | ICD-10-CM | POA: Insufficient documentation

## 2020-12-29 DIAGNOSIS — M546 Pain in thoracic spine: Secondary | ICD-10-CM | POA: Insufficient documentation

## 2020-12-29 MED ORDER — HYDROCODONE-ACETAMINOPHEN 10-325 MG PO TABS
1.0000 | ORAL_TABLET | Freq: Four times a day (QID) | ORAL | 0 refills | Status: DC | PRN
Start: 2020-12-29 — End: 2020-12-29

## 2020-12-29 MED ORDER — HYDROCODONE-ACETAMINOPHEN 10-325 MG PO TABS: 1.0000 | ORAL_TABLET | Freq: Four times a day (QID) | ORAL | 0 refills | Status: DC | PRN
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
## 2021-01-04 ENCOUNTER — Telehealth: Payer: Self-pay | Admitting: Physical Medicine & Rehabilitation

## 2021-01-04 NOTE — Telephone Encounter (Signed)
Two-step process, first genicular nerve blocks and then genicular nerve RF.  Please ask Dr. Riley Kill if he is okay with this

## 2021-01-04 NOTE — Telephone Encounter (Signed)
Patient had went to [facility] today for a visit with her knees.  They have suggested that she have genicular nerve blocks in them and wanted her to see a pain clinic.  She told them that she has seen Dr.Kirsteins, Dr. Riley Kill and Jacalyn Lefevre in our clinic.  They told her that this type of injection could last up to a year.  She would like to have them done here if our doctors could do them.  I told her that a follow up may be needed to discuss this with out physician.  Please advise.  Thank you.

## 2021-01-08 NOTE — Telephone Encounter (Signed)
Certainly, please proceed.  thx

## 2021-01-09 NOTE — Telephone Encounter (Signed)
Schedule for Genicular nerve block on the most symptomatic side

## 2021-02-01 ENCOUNTER — Ambulatory Visit: Payer: 59 | Admitting: Physical Medicine & Rehabilitation

## 2021-02-06 ENCOUNTER — Encounter: Payer: BC Managed Care – PPO | Attending: Registered Nurse | Admitting: Physical Medicine & Rehabilitation

## 2021-02-06 ENCOUNTER — Encounter: Payer: Self-pay | Admitting: Physical Medicine & Rehabilitation

## 2021-02-06 ENCOUNTER — Other Ambulatory Visit: Payer: Self-pay

## 2021-02-06 VITALS — BP 93/62 | HR 77 | Temp 98.5°F | Ht <= 58 in | Wt 123.6 lb

## 2021-02-06 DIAGNOSIS — M1712 Unilateral primary osteoarthritis, left knee: Secondary | ICD-10-CM | POA: Diagnosis not present

## 2021-02-06 MED ORDER — HYDROCODONE-ACETAMINOPHEN 10-325 MG PO TABS: 1.0000 | ORAL_TABLET | Freq: Four times a day (QID) | ORAL | 0 refills | Status: DC | PRN

## 2021-02-06 NOTE — Patient Instructions (Addendum)
If this block of the nerves to the knee relieves at least 50% of your pain, a radiofrequency procedure of these same nerves can give a longer duration of relief  Discuss results with Kathryn Farrell at next visit

## 2021-02-06 NOTE — Progress Notes (Signed)
  PROCEDURE RECORD [facility]   Name: Kathryn Farrell DOB:November 24, 1960 MRN: [id number]  Date:02/06/2021  Physician: Claudette Laws, MD    Nurse/CMA: Leroy Kennedy  Allergies:  Allergies  Allergen Reactions  . Lodine [Etodolac] Nausea And Vomiting  . Penicillins Other (See Comments)    Was a child; doesn't remember. Was a child; doesn't remember. Was a child; doesn't remember.  . Tizanidine Hcl Other (See Comments)    Other reaction(s): Anxious, heart flutter Other reaction(s): Anxious, heart flutter Anxious, heart flutter  . Trileptal [Oxcarbazepine] Other (See Comments)    Chest pain  . Venlafaxine     Other reaction(s): Makes her jittery  . Carbamazepine Hives, Rash and Other (See Comments)    Other reaction(s): Chest pains, numbness, tingling.  Itching tingling Other reaction(s): Chest pains, numbness, tingling.  Itching tingling Chest pains, numbness, tingling.   . Effexor [Venlafaxine Hydrochloride]     Makes her jittery  . Gabapentin Palpitations    Chest Pains    Consent Signed: Yes.    Is patient diabetic? No.  CBG today? N/A Pregnant: No. LMP: No LMP recorded. Patient has had a hysterectomy. (age [age])  Anticoagulants: no Anti-inflammatory: no Antibiotics: no  Procedure: Left Genicular Nerve Block  Position: Supine Start Time:3:03pm End Time:3:12pm Fluoro Time:33  RN/CMA  Zack Seal, CMA    Time 2:33 PM 3:20pm    BP 93/62 95/65    Pulse 77 72    Respirations 14 14    O2 Sat 95 93    S/S 6 6    Pain Level 8/10 6/10     D/C home with Self, patient A & O X 3, D/C instructions reviewed, and sits independently.

## 2021-02-06 NOTE — Progress Notes (Signed)
Left knee genicular nerve blocks under fluoroscopic guidance  Indication Chronic severe post operative knee pain that has not responded to PT, medication, and other conservative care.  Informed consent was obtained after discussing risks and benefits of procedure with the patient.  These include bleeding, bruising and infection as well as foot numbness. Pt placed in supine position on the fluoro table .  Static images identified distal femur and proximal tibia.  Lateral and medial supracondylar , as well as medial tibial flare prepped with betadine.  Marked under fluoro and then a 25 g 1.5 inch needle was used to anesthetize skin and subcutaneous tissue. 1.5 cc was infiltrate into each of 3 sites. Then a 22-gauge 3.5 inch needle was inserted under fluoroscopic guidance first targeting the medial tibial flare midpoint. After bone contact was made lateral images confirmed proper positioning and Isovue 200 times one ML was injected. This showed no evidence of intravascular uptake. Then 1.5 ML 2% lidocaine was injected. This same procedure was treated for the lateral and medial supracondylar areas. Patient tolerated procedure well. Post procedure instructions given.
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
## 2021-02-27 ENCOUNTER — Encounter: Payer: BC Managed Care – PPO | Attending: Registered Nurse | Admitting: Registered Nurse

## 2021-02-27 ENCOUNTER — Other Ambulatory Visit: Payer: Self-pay

## 2021-02-27 VITALS — BP 93/61 | HR 77 | Temp 98.6°F | Ht <= 58 in | Wt 121.4 lb

## 2021-02-27 DIAGNOSIS — M542 Cervicalgia: Secondary | ICD-10-CM | POA: Diagnosis present

## 2021-02-27 DIAGNOSIS — M47816 Spondylosis without myelopathy or radiculopathy, lumbar region: Secondary | ICD-10-CM | POA: Diagnosis present

## 2021-02-27 DIAGNOSIS — Z5181 Encounter for therapeutic drug level monitoring: Secondary | ICD-10-CM | POA: Diagnosis present

## 2021-02-27 DIAGNOSIS — M25511 Pain in right shoulder: Secondary | ICD-10-CM | POA: Insufficient documentation

## 2021-02-27 DIAGNOSIS — M961 Postlaminectomy syndrome, not elsewhere classified: Secondary | ICD-10-CM | POA: Diagnosis present

## 2021-02-27 DIAGNOSIS — G894 Chronic pain syndrome: Secondary | ICD-10-CM | POA: Diagnosis present

## 2021-02-27 DIAGNOSIS — G8929 Other chronic pain: Secondary | ICD-10-CM | POA: Diagnosis present

## 2021-02-27 DIAGNOSIS — M47812 Spondylosis without myelopathy or radiculopathy, cervical region: Secondary | ICD-10-CM | POA: Insufficient documentation

## 2021-02-27 DIAGNOSIS — M7062 Trochanteric bursitis, left hip: Secondary | ICD-10-CM | POA: Diagnosis present

## 2021-02-27 DIAGNOSIS — M797 Fibromyalgia: Secondary | ICD-10-CM | POA: Diagnosis present

## 2021-02-27 DIAGNOSIS — M170 Bilateral primary osteoarthritis of knee: Secondary | ICD-10-CM | POA: Diagnosis present

## 2021-02-27 DIAGNOSIS — Z79891 Long term (current) use of opiate analgesic: Secondary | ICD-10-CM | POA: Insufficient documentation

## 2021-02-27 DIAGNOSIS — M7061 Trochanteric bursitis, right hip: Secondary | ICD-10-CM | POA: Diagnosis present

## 2021-02-27 DIAGNOSIS — M25512 Pain in left shoulder: Secondary | ICD-10-CM | POA: Insufficient documentation

## 2021-02-27 MED ORDER — HYDROCODONE-ACETAMINOPHEN 10-325 MG PO TABS: 1.0000 | ORAL_TABLET | Freq: Four times a day (QID) | ORAL | 0 refills | Status: DC | PRN

## 2021-03-02 ENCOUNTER — Encounter: Payer: Self-pay | Admitting: Registered Nurse

## 2021-03-02 LAB — DRUG TOX MONITOR 1 W/CONF, ORAL FLD
Alprazolam: 1.19 ng/mL — ABNORMAL HIGH (ref <0.50)
Amphetamines: NEGATIVE ng/mL (ref <10)
Barbiturates: NEGATIVE ng/mL (ref <10)
Benzodiazepines: POSITIVE ng/mL — AB (ref <0.50)
Buprenorphine: NEGATIVE ng/mL (ref <0.10)
Chlordiazepoxide: NEGATIVE ng/mL (ref <0.50)
Clonazepam: NEGATIVE ng/mL (ref <0.50)
Cocaine: NEGATIVE ng/mL (ref <5.0)
Codeine: NEGATIVE ng/mL (ref <2.5)
Diazepam: NEGATIVE ng/mL (ref <0.50)
Dihydrocodeine: 5.8 ng/mL — ABNORMAL HIGH (ref <2.5)
Fentanyl: NEGATIVE ng/mL (ref <0.10)
Flunitrazepam: NEGATIVE ng/mL (ref <0.50)
Flurazepam: NEGATIVE ng/mL (ref <0.50)
Heroin Metabolite: NEGATIVE ng/mL (ref <1.0)
Hydrocodone: 43.2 ng/mL — ABNORMAL HIGH (ref <2.5)
Hydromorphone: NEGATIVE ng/mL (ref <2.5)
Lorazepam: NEGATIVE ng/mL (ref <0.50)
MARIJUANA: NEGATIVE ng/mL (ref <2.5)
MDMA: NEGATIVE ng/mL (ref <10)
Meprobamate: NEGATIVE ng/mL (ref <2.5)
Methadone: NEGATIVE ng/mL (ref <5.0)
Midazolam: NEGATIVE ng/mL (ref <0.50)
Morphine: NEGATIVE ng/mL (ref <2.5)
Nicotine Metabolite: NEGATIVE ng/mL (ref <5.0)
Nordiazepam: NEGATIVE ng/mL (ref <0.50)
Norhydrocodone: 6 ng/mL — ABNORMAL HIGH (ref <2.5)
Noroxycodone: NEGATIVE ng/mL (ref <2.5)
Opiates: POSITIVE ng/mL — AB (ref <2.5)
Oxazepam: NEGATIVE ng/mL (ref <0.50)
Oxycodone: NEGATIVE ng/mL (ref <2.5)
Oxymorphone: NEGATIVE ng/mL (ref <2.5)
Phencyclidine: NEGATIVE ng/mL (ref <10)
Tapentadol: NEGATIVE ng/mL (ref <5.0)
Temazepam: NEGATIVE ng/mL (ref <0.50)
Tramadol: NEGATIVE ng/mL (ref <5.0)
Triazolam: NEGATIVE ng/mL (ref <0.50)
Zolpidem: NEGATIVE ng/mL (ref <5.0)

## 2021-03-02 LAB — DRUG TOX ALC METAB W/CON, ORAL FLD: Alcohol Metabolite: NEGATIVE ng/mL (ref <25)

## 2021-03-06 ENCOUNTER — Telehealth: Payer: Self-pay | Admitting: *Deleted

## 2021-03-06 NOTE — Telephone Encounter (Signed)
Oral swab drug screen was consistent for prescribed medications.  ?

## 2021-03-28 ENCOUNTER — Other Ambulatory Visit: Payer: Self-pay

## 2021-03-28 ENCOUNTER — Encounter: Payer: Self-pay | Admitting: Obstetrics and Gynecology

## 2021-03-28 ENCOUNTER — Ambulatory Visit (INDEPENDENT_AMBULATORY_CARE_PROVIDER_SITE_OTHER): Payer: BC Managed Care – PPO | Admitting: Obstetrics and Gynecology

## 2021-03-28 VITALS — BP 90/60 | Ht 59.5 in | Wt 123.0 lb

## 2021-03-28 DIAGNOSIS — Z01419 Encounter for gynecological examination (general) (routine) without abnormal findings: Secondary | ICD-10-CM

## 2021-03-28 DIAGNOSIS — T8543XA Leakage of breast prosthesis and implant, initial encounter: Secondary | ICD-10-CM | POA: Diagnosis not present

## 2021-03-28 DIAGNOSIS — Z1231 Encounter for screening mammogram for malignant neoplasm of breast: Secondary | ICD-10-CM | POA: Diagnosis not present

## 2021-03-28 NOTE — Patient Instructions (Signed)
I value your feedback and you entrusting us with your care. If you get a [facility] patient survey, I would appreciate you taking the time to let us know about your experience today. Thank you!  [facility]: [phone]      

## 2021-03-28 NOTE — Progress Notes (Signed)
Chief Complaint  Patient presents with  . Gynecologic Exam    Right breast still bothering her     HPI:      [name] is a 60 y.o. G2P2 who LMP was No LMP recorded. Patient has had a hysterectomy., presents today for her annual examination.  Her menses are absent due to [facility]. She does not have PMB.  She does not have vasomotor sx.   Sex activity: no longer sex active. She does not have vaginal dryness. She used to use premarin vag crm wkly with sx relief, but doesn't need it anymore. Last Pap: 01/13/20 Results were: no abnormalities /neg HPV DNA.   Last mammogram: 01/21/20. Results were: normal--routine follow-up in 12 months. Has implants. There is no FH of breast cancer. There is no FH of ovarian cancer. The patient does do self-breast exams. Had noticed LT breast mass, c/w prominent fat lobule on mammo/u/s ~10:00 position.  RT breast implant has gotten harder and has stinging/burning pain in RT breast for past couple yrs. Implants looked intact on mammogram last yr but CT heart 7/21 showed likely "bilateral breast implant intracapsular ruptures. No aggressive appearing osseous lesions."  Colonoscopy: 2020 at [facility]; repeat due after 5 years due to hyst of polyps. DEXA: 2/22 at [facility] with PCP; hx of osteoporosis due to early menopause with hyst. Does calcium/vit D/exercise and has restarted infusions.  Tobacco use: The patient denies current or previous tobacco use. Alcohol use: none  No drug use Exercise: moderately active  She does get adequate calcium and Vitamin D in her diet.  Labs with PCP.   Past Medical History:  Diagnosis Date  . Arthritis    seronegative inflammatory arthritis/cervical disc disease  . C. difficile colitis 2008  . Chronic cholecystitis 05/07/2013  . Colon polyps 2008  . Complication of anesthesia    BP drops very low  . COVID-19 affecting pregnancy in third trimester 10/28/2019   had negative test first week of March 2021  . GERD  (gastroesophageal reflux disease)   . Headache    sinus  . Hiatal hernia 2014  . Hypercholesterolemia   . Mild depression (HCC)   . Neuromuscular disorder (HCC) 2009   centralized pain syndrome/fibromylagia  . Osteoporosis   . PONV (postoperative nausea and vomiting)     Past Surgical History:  Procedure Laterality Date  . ABDOMINAL HYSTERECTOMY    . arm surgery     "muscle came off the elbow"  . AUGMENTATION MAMMAPLASTY    . CESAREAN SECTION    . CHOLECYSTECTOMY    . COLONOSCOPY  2008  . ENDOSCOPIC CONCHA BULLOSA RESECTION Left 01/25/2020   Procedure: ENDOSCOPIC CONCHA BULLOSA RESECTION;  Surgeon: Geanie Logan, MD;  Location: [facility];  Service: ENT;  Laterality: Left;  . IMAGE GUIDED SINUS SURGERY Left 01/25/2020   Procedure: IMAGE GUIDED SINUS SURGERY;  Surgeon: Geanie Logan, MD;  Location: [facility];  Service: ENT;  Laterality: Left;  PLACED DISK ON OR CHARGE NURSE DESK 3-18  KP  . KNEE ARTHROSCOPY  2004/2008   x2  . MAXILLARY ANTROSTOMY Left 01/25/2020   Procedure: MAXILLARY ANTROSTOMY with tissue;  Surgeon: Geanie Logan, MD;  Location: [facility];  Service: ENT;  Laterality: Left;  needs styker disk  . NECK SURGERY    . SPINE SURGERY  2008   cervical fusion  . TOTAL ABDOMINAL HYSTERECTOMY W/ BILATERAL SALPINGOOPHORECTOMY  1989  . TOTAL SHOULDER REPLACEMENT Left 12/10/221  . UPPER GI  ENDOSCOPY  04-27-13   Dr [name]    Family History  Problem Relation Age of Onset  . COPD Mother   . Asthma Mother   . Diabetes Brother   . Stroke Brother   . Hypertension Brother      ROS:  Review of Systems  Constitutional: Negative for fatigue, fever and unexpected weight change.  Respiratory: Negative for cough, shortness of breath and wheezing.   Cardiovascular: Negative for chest pain, palpitations and leg swelling.  Gastrointestinal: Negative for blood in stool, constipation, diarrhea, nausea and vomiting.  Endocrine: Negative for cold  intolerance, heat intolerance and polyuria.  Genitourinary: Negative for dyspareunia, dysuria, flank pain, frequency, genital sores, hematuria, menstrual problem, pelvic pain, urgency, vaginal bleeding, vaginal discharge and vaginal pain.  Musculoskeletal: Negative for back pain, joint swelling and myalgias.  Skin: Negative for rash.  Neurological: Negative for dizziness, syncope, light-headedness, numbness and headaches.  Hematological: Negative for adenopathy.  Psychiatric/Behavioral: Negative for agitation, confusion, sleep disturbance and suicidal ideas. The patient is not nervous/anxious.     Objective: BP 90/60   Ht 4' 11.5" (1.511 m)   Wt 123 lb (55.8 kg)   BMI 24.43 kg/m    Physical Exam Constitutional:      Appearance: She is well-developed.  Genitourinary:     Vulva normal.     Right Labia: No rash, tenderness or lesions.    Left Labia: No tenderness, lesions or rash.    No vaginal discharge, erythema or tenderness.      Right Adnexa: not tender and no mass present.    Left Adnexa: not tender and no mass present.    No cervical friability or polyp.     Uterus is not enlarged or tender.  Breasts:     Right: Breast implant present. No mass, nipple discharge, skin change or tenderness.     Left: Breast implant present. No mass, nipple discharge, skin change or tenderness.     Breast exam comments: RT BREAST IMPLANT FIRM; LT BREAST WNL.   Neck:     Thyroid: No thyromegaly.  Cardiovascular:     Rate and Rhythm: Normal rate and regular rhythm.     Heart sounds: Normal heart sounds. No murmur heard.   Pulmonary:     Effort: Pulmonary effort is normal.     Breath sounds: Normal breath sounds.  Abdominal:     Palpations: Abdomen is soft.     Tenderness: There is no abdominal tenderness. There is no guarding or rebound.  Musculoskeletal:        General: Normal range of motion.     Cervical back: Normal range of motion.  Lymphadenopathy:     Cervical: No cervical  adenopathy.  Neurological:     General: No focal deficit present.     Mental Status: She is alert and oriented to person, place, and time.     Cranial Nerves: No cranial nerve deficit.  Skin:    General: Skin is warm and dry.  Psychiatric:        Mood and Affect: Mood normal.        Behavior: Behavior normal.        Thought Content: Thought content normal.        Judgment: Judgment normal.  Vitals reviewed.    Assessment/Plan:  Encounter for annual routine gynecological examination  Encounter for screening mammogram for malignant neoplasm of breast - Plan: MM 3D SCREEN BREAST BILATERAL; pt to call [place] to see what should be scheduled given bilat  breast implant rupture per CT scan 7/21.  Rupture of implant of right breast, initial encounter--pt to f/u with plastic surg for further eval/mgmt.           GYN counsel mammography screening, menopause, adequate intake of calcium and vitamin D     F/U  Return in about 1 year (around 03/28/2022).  Carter Kaman B. Shiven Junious, PA-C 03/28/2021 2:16 PM

## 2021-03-29 ENCOUNTER — Other Ambulatory Visit: Payer: Self-pay | Admitting: Obstetrics and Gynecology

## 2021-03-29 DIAGNOSIS — Z1231 Encounter for screening mammogram for malignant neoplasm of breast: Secondary | ICD-10-CM

## 2021-04-27 ENCOUNTER — Other Ambulatory Visit: Payer: Self-pay

## 2021-04-27 ENCOUNTER — Encounter: Payer: BC Managed Care – PPO | Attending: Registered Nurse | Admitting: Registered Nurse

## 2021-04-27 ENCOUNTER — Encounter: Payer: Self-pay | Admitting: Registered Nurse

## 2021-04-27 VITALS — BP 89/53 | HR 59 | Temp 98.0°F | Ht 59.0 in | Wt 121.2 lb

## 2021-04-27 DIAGNOSIS — Z79891 Long term (current) use of opiate analgesic: Secondary | ICD-10-CM | POA: Insufficient documentation

## 2021-04-27 DIAGNOSIS — M47816 Spondylosis without myelopathy or radiculopathy, lumbar region: Secondary | ICD-10-CM | POA: Diagnosis not present

## 2021-04-27 DIAGNOSIS — M170 Bilateral primary osteoarthritis of knee: Secondary | ICD-10-CM | POA: Insufficient documentation

## 2021-04-27 DIAGNOSIS — Z5181 Encounter for therapeutic drug level monitoring: Secondary | ICD-10-CM | POA: Insufficient documentation

## 2021-04-27 DIAGNOSIS — M961 Postlaminectomy syndrome, not elsewhere classified: Secondary | ICD-10-CM | POA: Insufficient documentation

## 2021-04-27 DIAGNOSIS — M7062 Trochanteric bursitis, left hip: Secondary | ICD-10-CM | POA: Insufficient documentation

## 2021-04-27 DIAGNOSIS — M542 Cervicalgia: Secondary | ICD-10-CM | POA: Insufficient documentation

## 2021-04-27 DIAGNOSIS — G894 Chronic pain syndrome: Secondary | ICD-10-CM | POA: Insufficient documentation

## 2021-04-27 DIAGNOSIS — M7061 Trochanteric bursitis, right hip: Secondary | ICD-10-CM | POA: Diagnosis not present

## 2021-04-27 DIAGNOSIS — M797 Fibromyalgia: Secondary | ICD-10-CM | POA: Diagnosis present

## 2021-04-27 MED ORDER — HYDROCODONE-ACETAMINOPHEN 10-325 MG PO TABS: 1.0000 | ORAL_TABLET | Freq: Four times a day (QID) | ORAL | 0 refills | Status: DC | PRN
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
## 2021-06-25 ENCOUNTER — Encounter: Payer: BC Managed Care – PPO | Attending: Registered Nurse | Admitting: Registered Nurse

## 2021-06-25 ENCOUNTER — Other Ambulatory Visit: Payer: Self-pay

## 2021-06-25 ENCOUNTER — Encounter: Payer: Self-pay | Admitting: Registered Nurse

## 2021-06-25 VITALS — BP 100/58 | HR 70 | Temp 98.4°F | Ht 59.0 in | Wt 123.0 lb

## 2021-06-25 DIAGNOSIS — M7061 Trochanteric bursitis, right hip: Secondary | ICD-10-CM | POA: Insufficient documentation

## 2021-06-25 DIAGNOSIS — M961 Postlaminectomy syndrome, not elsewhere classified: Secondary | ICD-10-CM | POA: Diagnosis present

## 2021-06-25 DIAGNOSIS — Z79891 Long term (current) use of opiate analgesic: Secondary | ICD-10-CM

## 2021-06-25 DIAGNOSIS — M7062 Trochanteric bursitis, left hip: Secondary | ICD-10-CM | POA: Insufficient documentation

## 2021-06-25 DIAGNOSIS — M62838 Other muscle spasm: Secondary | ICD-10-CM | POA: Insufficient documentation

## 2021-06-25 DIAGNOSIS — M5416 Radiculopathy, lumbar region: Secondary | ICD-10-CM | POA: Diagnosis present

## 2021-06-25 DIAGNOSIS — M542 Cervicalgia: Secondary | ICD-10-CM | POA: Insufficient documentation

## 2021-06-25 DIAGNOSIS — G894 Chronic pain syndrome: Secondary | ICD-10-CM | POA: Insufficient documentation

## 2021-06-25 DIAGNOSIS — M47816 Spondylosis without myelopathy or radiculopathy, lumbar region: Secondary | ICD-10-CM | POA: Diagnosis present

## 2021-06-25 DIAGNOSIS — M797 Fibromyalgia: Secondary | ICD-10-CM | POA: Diagnosis present

## 2021-06-25 DIAGNOSIS — M170 Bilateral primary osteoarthritis of knee: Secondary | ICD-10-CM | POA: Diagnosis present

## 2021-06-25 DIAGNOSIS — Z5181 Encounter for therapeutic drug level monitoring: Secondary | ICD-10-CM | POA: Diagnosis present

## 2021-06-25 MED ORDER — HYDROCODONE-ACETAMINOPHEN 10-325 MG PO TABS: 1.0000 | ORAL_TABLET | Freq: Four times a day (QID) | ORAL | 0 refills | Status: DC | PRN
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
## 2021-08-20 ENCOUNTER — Ambulatory Visit
Admission: RE | Admit: 2021-08-20 | Discharge: 2021-08-20 | Disposition: A | Discharge status: Home / Self Care | Payer: Medicare HMO | Source: Ambulatory Visit | Attending: Registered Nurse | Admitting: Registered Nurse

## 2021-08-20 ENCOUNTER — Other Ambulatory Visit: Payer: Self-pay

## 2021-08-20 ENCOUNTER — Encounter: Payer: Self-pay | Admitting: Registered Nurse

## 2021-08-20 ENCOUNTER — Encounter: Payer: Medicare HMO | Attending: Registered Nurse | Admitting: Registered Nurse

## 2021-08-20 VITALS — BP 96/65 | HR 64 | Ht 59.0 in | Wt 125.2 lb

## 2021-08-20 DIAGNOSIS — M542 Cervicalgia: Secondary | ICD-10-CM

## 2021-08-20 DIAGNOSIS — M47816 Spondylosis without myelopathy or radiculopathy, lumbar region: Secondary | ICD-10-CM

## 2021-08-20 DIAGNOSIS — M797 Fibromyalgia: Secondary | ICD-10-CM | POA: Diagnosis present

## 2021-08-20 DIAGNOSIS — M7061 Trochanteric bursitis, right hip: Secondary | ICD-10-CM | POA: Diagnosis present

## 2021-08-20 DIAGNOSIS — G8929 Other chronic pain: Secondary | ICD-10-CM

## 2021-08-20 DIAGNOSIS — G894 Chronic pain syndrome: Secondary | ICD-10-CM | POA: Diagnosis present

## 2021-08-20 DIAGNOSIS — M961 Postlaminectomy syndrome, not elsewhere classified: Secondary | ICD-10-CM | POA: Diagnosis present

## 2021-08-20 DIAGNOSIS — M25551 Pain in right hip: Secondary | ICD-10-CM

## 2021-08-20 DIAGNOSIS — M545 Low back pain, unspecified: Secondary | ICD-10-CM | POA: Diagnosis not present

## 2021-08-20 DIAGNOSIS — Z79891 Long term (current) use of opiate analgesic: Secondary | ICD-10-CM | POA: Diagnosis present

## 2021-08-20 DIAGNOSIS — M5416 Radiculopathy, lumbar region: Secondary | ICD-10-CM

## 2021-08-20 DIAGNOSIS — Z5181 Encounter for therapeutic drug level monitoring: Secondary | ICD-10-CM | POA: Diagnosis present

## 2021-08-20 DIAGNOSIS — M170 Bilateral primary osteoarthritis of knee: Secondary | ICD-10-CM

## 2021-08-20 MED ORDER — HYDROCODONE-ACETAMINOPHEN 10-325 MG PO TABS: 1.0000 | ORAL_TABLET | Freq: Four times a day (QID) | ORAL | 0 refills | Status: DC | PRN
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
## 2021-08-27 LAB — DRUG TOX MONITOR 1 W/CONF, ORAL FLD
Alprazolam: 0.55 ng/mL — ABNORMAL HIGH (ref <0.50)
Amphetamines: NEGATIVE ng/mL (ref <10)
Barbiturates: NEGATIVE ng/mL (ref <10)
Benzodiazepines: POSITIVE ng/mL — AB (ref <0.50)
Buprenorphine: NEGATIVE ng/mL (ref <0.10)
Chlordiazepoxide: NEGATIVE ng/mL (ref <0.50)
Clonazepam: NEGATIVE ng/mL (ref <0.50)
Cocaine: NEGATIVE ng/mL (ref <5.0)
Codeine: NEGATIVE ng/mL (ref <2.5)
Diazepam: NEGATIVE ng/mL (ref <0.50)
Dihydrocodeine: 8.4 ng/mL — ABNORMAL HIGH (ref <2.5)
Fentanyl: NEGATIVE ng/mL (ref <0.10)
Flunitrazepam: NEGATIVE ng/mL (ref <0.50)
Flurazepam: NEGATIVE ng/mL (ref <0.50)
Heroin Metabolite: NEGATIVE ng/mL (ref <1.0)
Hydrocodone: 94.6 ng/mL — ABNORMAL HIGH (ref <2.5)
Hydromorphone: NEGATIVE ng/mL (ref <2.5)
Lorazepam: NEGATIVE ng/mL (ref <0.50)
MARIJUANA: NEGATIVE ng/mL (ref <2.5)
MDMA: NEGATIVE ng/mL (ref <10)
Meprobamate: NEGATIVE ng/mL (ref <2.5)
Methadone: NEGATIVE ng/mL (ref <5.0)
Midazolam: NEGATIVE ng/mL (ref <0.50)
Morphine: NEGATIVE ng/mL (ref <2.5)
Nicotine Metabolite: NEGATIVE ng/mL (ref <5.0)
Nordiazepam: NEGATIVE ng/mL (ref <0.50)
Norhydrocodone: 5.3 ng/mL — ABNORMAL HIGH (ref <2.5)
Noroxycodone: NEGATIVE ng/mL (ref <2.5)
Opiates: POSITIVE ng/mL — AB (ref <2.5)
Oxazepam: NEGATIVE ng/mL (ref <0.50)
Oxycodone: NEGATIVE ng/mL (ref <2.5)
Oxymorphone: NEGATIVE ng/mL (ref <2.5)
Phencyclidine: NEGATIVE ng/mL (ref <10)
Tapentadol: NEGATIVE ng/mL (ref <5.0)
Temazepam: NEGATIVE ng/mL (ref <0.50)
Tramadol: NEGATIVE ng/mL (ref <5.0)
Triazolam: NEGATIVE ng/mL (ref <0.50)
Zolpidem: NEGATIVE ng/mL (ref <5.0)

## 2021-08-27 LAB — DRUG TOX ALC METAB W/CON, ORAL FLD: Alcohol Metabolite: NEGATIVE ng/mL (ref <25)

## 2021-08-28 ENCOUNTER — Telehealth: Payer: Self-pay | Admitting: Registered Nurse

## 2021-08-28 NOTE — Telephone Encounter (Signed)
Patient is asking for MRI due to right hip pain.

## 2021-08-30 NOTE — Telephone Encounter (Signed)
This provider spoke with Dr. Riley Kill regarding [name] request for MRI. Dr Riley Kill offered injection and antiinflammatory. [name] was called she states in the past she had a ulcer, she doesn't want a hip injection. She states she has an appointment with her orthopedist on 09/05/2021. She will keep this provider updated. We will continue to monitor.

## 2021-09-04 ENCOUNTER — Telehealth: Payer: Self-pay | Admitting: *Deleted

## 2021-09-04 NOTE — Telephone Encounter (Signed)
Oral swab drug screen was consistent for prescribed medications.  ?

## 2021-09-06 ENCOUNTER — Telehealth: Payer: Self-pay

## 2021-09-06 NOTE — Telephone Encounter (Signed)
Pt states she is in pain , she has a long flight in December and she is not scheduled to come back until January she has been added to the waitlist however she is not sure what to do until then since she is in severe pain . She has been seing Dr.Kirsteins for procedures . Please advise .

## 2021-10-11 ENCOUNTER — Telehealth: Payer: Self-pay | Admitting: Registered Nurse

## 2021-10-11 NOTE — Telephone Encounter (Signed)
Dr Wynn Banker reviewed Dr Kristopher Oppenheim note from 09/05/2021.  This provider called [name], she did't  receive any relief from the hip injection.  She will be scheduled for SI injection with Dr Wynn Banker on 10/12/2021. [name] is in agreement.

## 2021-10-12 ENCOUNTER — Other Ambulatory Visit: Payer: Self-pay

## 2021-10-12 ENCOUNTER — Encounter: Payer: Self-pay | Admitting: Physical Medicine & Rehabilitation

## 2021-10-12 ENCOUNTER — Encounter: Payer: Medicare HMO | Attending: Registered Nurse | Admitting: Physical Medicine & Rehabilitation

## 2021-10-12 VITALS — BP 104/65 | HR 65 | Temp 98.7°F | Ht 59.0 in | Wt 122.8 lb

## 2021-10-12 DIAGNOSIS — M533 Sacrococcygeal disorders, not elsewhere classified: Secondary | ICD-10-CM | POA: Diagnosis present

## 2021-10-12 MED ORDER — HYDROCODONE-ACETAMINOPHEN 10-325 MG PO TABS: 1.0000 | ORAL_TABLET | Freq: Four times a day (QID) | ORAL | 0 refills | Status: DC | PRN

## 2021-10-12 NOTE — Progress Notes (Signed)
°  PROCEDURE RECORD [facility]   Name: Kathryn Farrell DOB:Dec 29, 1960 MRN: [id number]  Date:10/12/2021  Physician: Claudette Laws, MD    Nurse/CMA: Charise Carwin MA  Allergies:  Allergies  Allergen Reactions   Lodine [Etodolac] Nausea And Vomiting   Penicillins Other (See Comments)    Was a child; doesn't remember. Was a child; doesn't remember. Was a child; doesn't remember.   Tizanidine Hcl Other (See Comments)    Other reaction(s): Anxious, heart flutter Other reaction(s): Anxious, heart flutter Anxious, heart flutter   Trileptal [Oxcarbazepine] Other (See Comments)    Chest pain   Venlafaxine     Other reaction(s): Makes her jittery   Carbamazepine Hives, Rash and Other (See Comments)    Other reaction(s): Chest pains, numbness, tingling.  Itching tingling Other reaction(s): Chest pains, numbness, tingling.  Itching tingling Chest pains, numbness, tingling.    Effexor [Venlafaxine Hydrochloride]     Makes her jittery   Gabapentin Palpitations    Chest Pains    Consent Signed: Yes.    Is patient diabetic? No.  CBG today? N/A  Pregnant: No. LMP: No LMP recorded. Patient has had a hysterectomy. (age [age])  Anticoagulants: no Anti-inflammatory: no Antibiotics: no  Procedure: Sacroiliac Steroid Injection  Position: Prone Start Time: 2:19 pm  End Time:2:25 pm   Fluoro Time: 22  RN/CMA Haley Fuerstenberg MA  Lizania Bouchard MA    Time 2:05 pm 2:27 pm    BP 104/66 99/66    Pulse 65 65    Respirations 12 12    O2 Sat 96 67    S/S 6 6    Pain Level 6/10 2/10     D/C home with Self, patient A & O X 3, D/C instructions reviewed, and sits independently.

## 2021-10-12 NOTE — Patient Instructions (Signed)
Sacroiliac injection was performed today. A combination of numbing medicine (lidocaine) plus a cortisone medicine (betamethasone) was injected. The injection was done under x-ray guidance. This procedure has been performed to help reduce low back and buttocks pain as well as potentially hip pain. The duration of this injection is variable lasting from hours to  Months. It may repeated if needed. 

## 2021-10-12 NOTE — Progress Notes (Signed)
Right sacroiliac injection under fluoroscopic guidance  Indication: Right Low back and buttocks pain not relieved by medication management and other conservative care.Pt did not respond to RIght hip intra articular injection under ultrasound guidance .  Prior Right sacroiliac injections in 2017 were effective   Informed consent was obtained after describing risks and benefits of the procedure with the patient, this includes bleeding, bruising, infection, paralysis and medication side effects. The patient wishes to proceed and has given written consent. The patient was placed in a prone position. The lumbar and sacral area was marked and prepped with Betadine. A 25-gauge 1-1/2 inch needle was inserted into the skin and subcutaneous tissue and 1 mL of 1% lidocaine was injected. Then a 25-gauge 3 inch spinal needle was inserted under fluoroscopic guidance into the Right sacroiliac joint. AP and lateral images were utilized. Omnipaque 180x0.5 mL under live fluoroscopy demonstrated no intravascular uptake. Then a solution containing one ML of 6 mgper ml betamethasone and 2 ML of 1% lidocaine MPF was injected x1.5 mL. Patient tolerated the procedure well. Post procedure instructions were given. Please see post procedure form.

## 2021-10-15 ENCOUNTER — Encounter: Payer: Medicare HMO | Admitting: Registered Nurse

## 2021-10-30 ENCOUNTER — Ambulatory Visit: Payer: Medicare HMO | Admitting: Physical Medicine & Rehabilitation

## 2021-10-30 ENCOUNTER — Telehealth: Payer: Self-pay | Admitting: *Deleted

## 2021-10-30 DIAGNOSIS — M961 Postlaminectomy syndrome, not elsewhere classified: Secondary | ICD-10-CM

## 2021-10-30 DIAGNOSIS — M542 Cervicalgia: Secondary | ICD-10-CM

## 2021-10-30 DIAGNOSIS — M5412 Radiculopathy, cervical region: Secondary | ICD-10-CM

## 2021-10-30 NOTE — Telephone Encounter (Signed)
Can you schedule her a office visit

## 2021-10-30 NOTE — Telephone Encounter (Signed)
Kathryn Farrell called and reports she has hurt her neck. She has had 3 neck surgeries, so she knows it is "messed up". She does not see Eunice until 12/06/2021 after she returns from [place] on 12/04/21. She is asking for an MRI of her neck. She called her surgeon at [facility] but they cannot see her until after she returns from [place] as well.

## 2021-10-31 ENCOUNTER — Encounter: Payer: Medicare HMO | Attending: Registered Nurse | Admitting: Registered Nurse

## 2021-10-31 ENCOUNTER — Telehealth: Payer: Self-pay | Admitting: *Deleted

## 2021-10-31 ENCOUNTER — Other Ambulatory Visit: Payer: Self-pay

## 2021-10-31 ENCOUNTER — Encounter: Payer: Self-pay | Admitting: Registered Nurse

## 2021-10-31 VITALS — BP 102/68 | HR 74 | Temp 98.8°F | Ht 59.0 in | Wt 126.0 lb

## 2021-10-31 DIAGNOSIS — M542 Cervicalgia: Secondary | ICD-10-CM

## 2021-10-31 DIAGNOSIS — Z5181 Encounter for therapeutic drug level monitoring: Secondary | ICD-10-CM | POA: Diagnosis present

## 2021-10-31 DIAGNOSIS — M7061 Trochanteric bursitis, right hip: Secondary | ICD-10-CM | POA: Diagnosis present

## 2021-10-31 DIAGNOSIS — M7062 Trochanteric bursitis, left hip: Secondary | ICD-10-CM | POA: Diagnosis present

## 2021-10-31 DIAGNOSIS — M961 Postlaminectomy syndrome, not elsewhere classified: Secondary | ICD-10-CM

## 2021-10-31 DIAGNOSIS — M47816 Spondylosis without myelopathy or radiculopathy, lumbar region: Secondary | ICD-10-CM

## 2021-10-31 DIAGNOSIS — G894 Chronic pain syndrome: Secondary | ICD-10-CM | POA: Diagnosis present

## 2021-10-31 DIAGNOSIS — Z79891 Long term (current) use of opiate analgesic: Secondary | ICD-10-CM | POA: Diagnosis present

## 2021-10-31 DIAGNOSIS — M170 Bilateral primary osteoarthritis of knee: Secondary | ICD-10-CM

## 2021-10-31 DIAGNOSIS — M797 Fibromyalgia: Secondary | ICD-10-CM

## 2021-10-31 DIAGNOSIS — M5412 Radiculopathy, cervical region: Secondary | ICD-10-CM

## 2021-10-31 MED ORDER — HYDROCODONE-ACETAMINOPHEN 10-325 MG PO TABS: 1.0000 | ORAL_TABLET | Freq: Four times a day (QID) | ORAL | 0 refills | Status: DC | PRN

## 2021-10-31 NOTE — Telephone Encounter (Signed)
MR Cervical requires location change to [facility] per insurance. New order placed.
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
## 2021-10-31 NOTE — Telephone Encounter (Signed)
Order changed to MRI with/without for cervical MRI.

## 2021-11-04 ENCOUNTER — Ambulatory Visit
Admission: RE | Admit: 2021-11-04 | Discharge: 2021-11-04 | Disposition: A | Discharge status: Home / Self Care | Payer: Medicare HMO | Source: Ambulatory Visit | Attending: Registered Nurse | Admitting: Registered Nurse

## 2021-11-04 ENCOUNTER — Other Ambulatory Visit: Payer: Self-pay

## 2021-11-04 DIAGNOSIS — M542 Cervicalgia: Secondary | ICD-10-CM | POA: Insufficient documentation

## 2021-11-04 DIAGNOSIS — M5412 Radiculopathy, cervical region: Secondary | ICD-10-CM | POA: Insufficient documentation

## 2021-11-04 DIAGNOSIS — M961 Postlaminectomy syndrome, not elsewhere classified: Secondary | ICD-10-CM | POA: Insufficient documentation

## 2021-11-04 MED ORDER — GADOBUTROL 1 MMOL/ML IV SOLN
5.0000 mL | Freq: Once | INTRAVENOUS | Status: AC | PRN
  Administered 2021-11-04: 7.5 mL via INTRAVENOUS

## 2021-11-05 ENCOUNTER — Encounter: Payer: Medicare HMO | Admitting: Registered Nurse

## 2021-11-05 ENCOUNTER — Telehealth: Payer: Self-pay | Admitting: Registered Nurse

## 2021-11-05 NOTE — Telephone Encounter (Signed)
Reviewed MRI with Dr Carlis Abbott.  Placed a call to Ms. Drab regarding her MRI, she verbalizes understanding. She will follow up with her Surgeon.

## 2021-12-14 ENCOUNTER — Telehealth: Payer: Self-pay

## 2021-12-14 MED ORDER — HYDROCODONE-ACETAMINOPHEN 10-325 MG PO TABS: 1.0000 | ORAL_TABLET | Freq: Four times a day (QID) | ORAL | 0 refills | Status: DC | PRN

## 2021-12-14 NOTE — Telephone Encounter (Signed)
PMP was Reviewed.  Hydrocodone e-scribed today, placed a call to Ms. Sheffler no answer. Left message for Ms. Earlene Plater

## 2021-12-14 NOTE — Telephone Encounter (Signed)
Patient called ans stated there is no February prescription for Hydrocodone at the pharmacy. Called pharmacy and they stated they did not have the prescription.

## 2022-01-14 ENCOUNTER — Encounter: Payer: Medicare HMO | Attending: Registered Nurse | Admitting: Registered Nurse

## 2022-01-14 ENCOUNTER — Other Ambulatory Visit: Payer: Self-pay

## 2022-01-14 VITALS — BP 114/75 | HR 80 | Ht 59.0 in | Wt 128.0 lb

## 2022-01-14 DIAGNOSIS — M542 Cervicalgia: Secondary | ICD-10-CM | POA: Insufficient documentation

## 2022-01-14 DIAGNOSIS — M7062 Trochanteric bursitis, left hip: Secondary | ICD-10-CM | POA: Insufficient documentation

## 2022-01-14 DIAGNOSIS — G894 Chronic pain syndrome: Secondary | ICD-10-CM | POA: Insufficient documentation

## 2022-01-14 DIAGNOSIS — M5412 Radiculopathy, cervical region: Secondary | ICD-10-CM | POA: Insufficient documentation

## 2022-01-14 DIAGNOSIS — M797 Fibromyalgia: Secondary | ICD-10-CM | POA: Diagnosis present

## 2022-01-14 DIAGNOSIS — M47816 Spondylosis without myelopathy or radiculopathy, lumbar region: Secondary | ICD-10-CM | POA: Insufficient documentation

## 2022-01-14 DIAGNOSIS — M961 Postlaminectomy syndrome, not elsewhere classified: Secondary | ICD-10-CM | POA: Diagnosis not present

## 2022-01-14 DIAGNOSIS — M7061 Trochanteric bursitis, right hip: Secondary | ICD-10-CM | POA: Diagnosis present

## 2022-01-14 DIAGNOSIS — M170 Bilateral primary osteoarthritis of knee: Secondary | ICD-10-CM | POA: Diagnosis present

## 2022-01-14 DIAGNOSIS — Z5181 Encounter for therapeutic drug level monitoring: Secondary | ICD-10-CM | POA: Insufficient documentation

## 2022-01-14 DIAGNOSIS — Z79891 Long term (current) use of opiate analgesic: Secondary | ICD-10-CM | POA: Diagnosis present

## 2022-01-14 MED ORDER — HYDROCODONE-ACETAMINOPHEN 10-325 MG PO TABS: 1.0000 | ORAL_TABLET | Freq: Four times a day (QID) | ORAL | 0 refills | Status: DC | PRN

## 2022-01-14 NOTE — Progress Notes (Signed)
? ?Subjective:  ? ? Patient ID: Kathryn Farrell, female    DOB: Jun 17, 1961, 61 y.o.   MRN: [id number] ? ?HPI: Kathryn Farrell is a 61 y.o. female who returns for follow up appointment for chronic pain and medication refill. She states her pain is located in her neck radiating into her bilateral shoulders, lower back pain, bilateral hip pain R>L and bilateral knee pain. She rates her pain 6. Her current exercise regime is walking and performing stretching exercises. ? ?Kathryn Farrell Morphine equivalent is 38.67 MME.   UDS ordered today.  ?  ? ?Pain Inventory ?Average Pain 6 ?Pain Right Now 6 ?My pain is sharp, stabbing, tingling, and aching ? ?In the last 24 hours, has pain interfered with the following? ?General activity 6 ?Relation with others 5 ?Enjoyment of life 5 ?What TIME of day is your pain at its worst? morning  ?Sleep (in general) Fair ? ?Pain is worse with: walking, bending, and standing ?Pain improves with: heat/ice, medication, TENS, and injections ?Relief from Meds: 5 ? ?Family History  ?Problem Relation Age of Onset  ? COPD Mother   ? Asthma Mother   ? Diabetes Brother   ? Stroke Brother   ? Hypertension Brother   ? ?Social History  ? ?Socioeconomic History  ? Marital status: Married  ?  Spouse name: Not on file  ? Number of children: Not on file  ? Years of education: Not on file  ? Highest education level: Not on file  ?Occupational History  ? Not on file  ?Tobacco Use  ? Smoking status: Former  ?  Types: Cigarettes  ?  Quit date: 10/28/1996  ?  Years since quitting: 25.2  ? Smokeless tobacco: Never  ?Vaping Use  ? Vaping Use: Never used  ?Substance and Sexual Activity  ? Alcohol use: No  ? Drug use: No  ? Sexual activity: Not Currently  ?  Birth control/protection: Surgical  ?  Comment: Hysterectomy  ?Other Topics Concern  ? Not on file  ?Social History Narrative  ? Not on file  ? ?Social Determinants of Health  ? ?Financial Resource Strain: Not on file  ?Food Insecurity: Not on file  ?Transportation Needs:  Not on file  ?Physical Activity: Not on file  ?Stress: Not on file  ?Social Connections: Not on file  ? ?Past Surgical History:  ?Procedure Laterality Date  ? ABDOMINAL HYSTERECTOMY    ? arm surgery    ? "muscle came off the elbow"  ? AUGMENTATION MAMMAPLASTY    ? CESAREAN SECTION    ? CHOLECYSTECTOMY    ? COLONOSCOPY  2008  ? ENDOSCOPIC CONCHA BULLOSA RESECTION Left 01/25/2020  ? Procedure: ENDOSCOPIC CONCHA BULLOSA RESECTION;  Surgeon: Geanie Logan, MD;  Location: [facility];  Service: ENT;  Laterality: Left;  ? IMAGE GUIDED SINUS SURGERY Left 01/25/2020  ? Procedure: IMAGE GUIDED SINUS SURGERY;  Surgeon: Geanie Logan, MD;  Location: [facility];  Service: ENT;  Laterality: Left;  PLACED DISK ON OR CHARGE NURSE DESK 3-18  KP  ? KNEE ARTHROSCOPY  2004/2008  ? x2  ? MAXILLARY ANTROSTOMY Left 01/25/2020  ? Procedure: MAXILLARY ANTROSTOMY with tissue;  Surgeon: Geanie Logan, MD;  Location: [facility];  Service: ENT;  Laterality: Left;  needs styker disk  ? NECK SURGERY    ? SPINE SURGERY  2008  ? cervical fusion  ? TOTAL ABDOMINAL HYSTERECTOMY W/ BILATERAL SALPINGOOPHORECTOMY  1989  ? TOTAL SHOULDER REPLACEMENT Left 12/10/221  ? UPPER  GI ENDOSCOPY  04-27-13  ? Dr Tracey HarriesBlazer  ? ?Past Surgical History:  ?Procedure Laterality Date  ? ABDOMINAL HYSTERECTOMY    ? arm surgery    ? "muscle came off the elbow"  ? AUGMENTATION MAMMAPLASTY    ? CESAREAN SECTION    ? CHOLECYSTECTOMY    ? COLONOSCOPY  2008  ? ENDOSCOPIC CONCHA BULLOSA RESECTION Left 01/25/2020  ? Procedure: ENDOSCOPIC CONCHA BULLOSA RESECTION;  Surgeon: Geanie LoganBennett, Paul, MD;  [facility];  Service: ENT;  Laterality: Left;  ? IMAGE GUIDED SINUS SURGERY Left 01/25/2020  ? Procedure: IMAGE GUIDED SINUS SURGERY;  Surgeon: Geanie LoganBennett, Paul, MD;  [facility];  Service: ENT;  Laterality: Left;  PLACED DISK ON OR CHARGE NURSE DESK 3-18  KP  ? KNEE ARTHROSCOPY  2004/2008  ? x2  ? MAXILLARY ANTROSTOMY Left 01/25/2020  ?  Procedure: MAXILLARY ANTROSTOMY with tissue;  Surgeon: Geanie LoganBennett, Paul, MD;  [facility];  Service: ENT;  Laterality: Left;  needs styker disk  ? NECK SURGERY    ? SPINE SURGERY  2008  ? cervical fusion  ? TOTAL ABDOMINAL HYSTERECTOMY W/ BILATERAL SALPINGOOPHORECTOMY  1989  ? TOTAL SHOULDER REPLACEMENT Left 12/10/221  ? UPPER GI ENDOSCOPY  04-27-13  ? Dr Tracey HarriesBlazer  ? ?Past Medical History:  ?Diagnosis Date  ? Arthritis   ? seronegative inflammatory arthritis/cervical disc disease  ? C. difficile colitis 2008  ? Chronic cholecystitis 05/07/2013  ? Colon polyps 2008  ? Complication of anesthesia   ? BP drops very low  ? COVID-19 affecting pregnancy in third trimester 10/28/2019  ? had negative test first week of March 2021  ? GERD (gastroesophageal reflux disease)   ? Headache   ? sinus  ? Hiatal hernia 2014  ? Hypercholesterolemia   ? Mild depression   ? Neuromuscular disorder ([facility]) 2009  ? centralized pain syndrome/fibromylagia  ? Osteoporosis   ? PONV (postoperative nausea and vomiting)   ? ?BP 114/75   Pulse 80   Ht 4\' 11"  (1.499 m)   Wt 128 lb (58.1 kg)   SpO2 95%   BMI 25.85 kg/m?  ? ?Opioid Risk Score:   ?Fall Risk Score:  `1 ? ?Depression screen PHQ 2/9 ? ?[facility]/31/2021 10/12/2021 08/20/2021 02/06/2021 10/31/2020 08/29/2020 08/15/2020  ?Decreased Interest 0 0 0 0 - 0 1  ?Down, Depressed, Hopeless - 0 0 0 - 0 1  ?PHQ - 2 Score 0 0 0 0 - 0 2  ?Altered sleeping - - - - 0 - -  ?Tired, decreased energy - - - - 0 - -  ?Change in appetite - - - - - - -  ?Feeling bad or failure about yourself  - - - - - - -  ?Trouble concentrating - - - - - - -  ?Moving slowly or fidgety/restless - - - - - - -  ?Suicidal thoughts - - - - - - -  ?PHQ-9 Score - - - - - - -  ?Some recent data might be hidden  ?  ? ?Review of Systems  ?Genitourinary:  Positive for pelvic pain.  ?Musculoskeletal:  Positive for back pain and neck pain.  ?     Bilateral hip, knee pain ?Shoulder pain ?  ?All other systems  reviewed and are negative. ? ?   ?Objective:  ? Physical Exam ?Vitals and nursing note reviewed.  ?Constitutional:   ?   Appearance: Normal appearance.  ?Neck:  ?  Comments: Cervical Paraspinal Tenderness: C-5-C-6 ?Cardiovascular:  ?   Rate and Rhythm: Normal rate and regular rhythm.  ?   Pulses: Normal pulses.  ?   Heart sounds: Normal heart sounds.  ?Pulmonary:  ?   Effort: Pulmonary effort is normal.  ?   Breath sounds: Normal breath sounds.  ?Musculoskeletal:  ?   Cervical back: Normal range of motion and neck supple.  ?   Comments: Normal Muscle Bulk and Muscle Testing Reveals:  ?Upper Extremities:Full ROM and Muscle Strength 5/5 ?Bilateral AC Joint Side  ?Thoracic and Lumbar Hypersensitivity ?Lower Extremities: Full ROM and Muscle Strength 5/5 ?Arises from Chair with ease ?Narrow Based  Gait  ?   ?Skin: ?   General: Skin is warm and dry.  ?Neurological:  ?   Mental Status: She is alert and oriented to person, place, and time.  ?Psychiatric:     ?   Mood and Affect: Mood normal.     ?   Behavior: Behavior normal.  ? ? ? ? ?   ?Assessment & Plan:  ?1. Centralized pain syndrome/ FMS : Continue with Heat and Exercise Regime. 01/14/2022 ?2. Lumbar spondylosis. With facet and disc disease. Left L5 radiculopathy: S/P Bilateral MBB and L5 Dorsal Ramus Injection on 08/15/2020: With good relief noted. 01/14/2022 ?Refilled:  Hydrocodone 10/325mg  one tablet every 6 hours as need #120. ?We will continue the opioid monitoring program, this consists of regular clinic visits, examinations, urine drug screen, pill counts as well as use of [place] Controlled Substance Reporting system. A 12 month History has been reviewed on the [place] Controlled Substance Reporting System on 01/14/2022 ?3. Muscle Spasms: Continue Flexeril. Continue to monitor.01/14/2022. ?4. Bilateral  greater trochanter bursitis:Continue with Ice/Heat Therapy. 01/14/2022 ?5. Osteoarthritis, right knee and left knee. S/P Left Knee genicular  Nerve Block with good relief noted. S/p scope right knee 10/26/13. Ortho following. 01/14/2022 ? 6. Right forearm extensor mechanism injury. No Complaints. Continue to Monitor. 01/14/2022. ?7. Hx of cervical fusion C4-6.

## 2022-01-18 LAB — TOXASSURE SELECT,+ANTIDEPR,UR

## 2022-01-19 ENCOUNTER — Encounter: Payer: Self-pay | Admitting: Registered Nurse

## 2022-01-23 ENCOUNTER — Telehealth: Payer: Self-pay | Admitting: *Deleted

## 2022-01-23 NOTE — Telephone Encounter (Signed)
Urine drug screen for this encounter is consistent for prescribed medication 

## 2022-02-11 DIAGNOSIS — E538 Deficiency of other specified B group vitamins: Secondary | ICD-10-CM | POA: Diagnosis not present

## 2022-02-18 ENCOUNTER — Telehealth: Payer: Self-pay

## 2022-02-18 NOTE — Telephone Encounter (Signed)
Pt called looking for Natalia Leatherwood NP, that was here before covid treating pt with laser treatments, discussed with pt Natalia Leatherwood is no longer here, so I referred pt to Dimple Casey for cosmetic procedures. ?

## 2022-03-13 ENCOUNTER — Encounter: Payer: Self-pay | Admitting: Registered Nurse

## 2022-03-13 ENCOUNTER — Encounter: Payer: Medicare HMO | Attending: Registered Nurse | Admitting: Registered Nurse

## 2022-03-13 VITALS — BP 101/70 | HR 74 | Ht 59.0 in | Wt 124.2 lb

## 2022-03-13 DIAGNOSIS — G894 Chronic pain syndrome: Secondary | ICD-10-CM | POA: Insufficient documentation

## 2022-03-13 DIAGNOSIS — M797 Fibromyalgia: Secondary | ICD-10-CM | POA: Insufficient documentation

## 2022-03-13 DIAGNOSIS — M7061 Trochanteric bursitis, right hip: Secondary | ICD-10-CM | POA: Insufficient documentation

## 2022-03-13 DIAGNOSIS — M542 Cervicalgia: Secondary | ICD-10-CM | POA: Insufficient documentation

## 2022-03-13 DIAGNOSIS — M961 Postlaminectomy syndrome, not elsewhere classified: Secondary | ICD-10-CM | POA: Insufficient documentation

## 2022-03-13 DIAGNOSIS — M47816 Spondylosis without myelopathy or radiculopathy, lumbar region: Secondary | ICD-10-CM | POA: Diagnosis not present

## 2022-03-13 DIAGNOSIS — Z79891 Long term (current) use of opiate analgesic: Secondary | ICD-10-CM | POA: Diagnosis present

## 2022-03-13 DIAGNOSIS — M170 Bilateral primary osteoarthritis of knee: Secondary | ICD-10-CM | POA: Insufficient documentation

## 2022-03-13 DIAGNOSIS — Z5181 Encounter for therapeutic drug level monitoring: Secondary | ICD-10-CM | POA: Insufficient documentation

## 2022-03-13 DIAGNOSIS — M5412 Radiculopathy, cervical region: Secondary | ICD-10-CM | POA: Diagnosis not present

## 2022-03-13 DIAGNOSIS — M7062 Trochanteric bursitis, left hip: Secondary | ICD-10-CM | POA: Insufficient documentation

## 2022-03-13 MED ORDER — HYDROCODONE-ACETAMINOPHEN 10-325 MG PO TABS: 1.0000 | ORAL_TABLET | Freq: Four times a day (QID) | ORAL | 0 refills | Status: DC | PRN

## 2022-03-13 NOTE — Progress Notes (Signed)
Subjective:    Patient ID: Kathryn Farrell, female    DOB: 12/20/1960, 61 y.o.   MRN: [id number]  HPI: Kathryn Farrell is a 61 y.o. female who returns for follow up appointment for chronic pain and medication refill. She states her pain is located in her neck radiating into her bilateral shoulders, lower back pain, bilateral hips and bilateral knee pain. She  rates her pain 6. Her current exercise regime is walking and performing stretching exercises.  [name] Morphine equivalent is 40.00 MME. She  is also prescribed Alprazolam  by Dr. Hyacinth MeekerMiller .We have discussed the black box warning of using opioids and benzodiazepines. I highlighted the dangers of using these drugs together and discussed the adverse events including respiratory suppression, overdose, cognitive impairment and importance of compliance with current regimen. We will continue to monitor and adjust as indicated.   Last UDS was Performed on 01/14/2022, it was consistent.     Pain Inventory Average Pain 7 Pain Right Now 6 My pain is sharp, burning, stabbing, and aching  In the last 24 hours, has pain interfered with the following? General activity 6 Relation with others 5 Enjoyment of life 5 What TIME of day is your pain at its worst? morning , daytime, evening, and night Sleep (in general) Fair  Pain is worse with: walking, bending, and standing Pain improves with: rest, heat/ice, medication, and TENS Relief from Meds: 6  Family History  Problem Relation Age of Onset  . COPD Mother   . Asthma Mother   . Diabetes Brother   . Stroke Brother   . Hypertension Brother    Social History   Socioeconomic History  . Marital status: Married    Spouse name: Not on file  . Number of children: Not on file  . Years of education: Not on file  . Highest education level: Not on file  Occupational History  . Not on file  Tobacco Use  . Smoking status: Former    Types: Cigarettes    Quit date: 10/28/1996    Years since  quitting: 25.3  . Smokeless tobacco: Never  Vaping Use  . Vaping Use: Never used  Substance and Sexual Activity  . Alcohol use: No  . Drug use: No  . Sexual activity: Not Currently    Birth control/protection: Surgical    Comment: Hysterectomy  Other Topics Concern  . Not on file  Social History Narrative  . Not on file   Social Determinants of Health   Financial Resource Strain: Not on file  Food Insecurity: Not on file  Transportation Needs: Not on file  Physical Activity: Not on file  Stress: Not on file  Social Connections: Not on file   Past Surgical History:  Procedure Laterality Date  . ABDOMINAL HYSTERECTOMY    . arm surgery     "muscle came off the elbow"  . AUGMENTATION MAMMAPLASTY    . CESAREAN SECTION    . CHOLECYSTECTOMY    . COLONOSCOPY  2008  . ENDOSCOPIC CONCHA BULLOSA RESECTION Left 01/25/2020   Procedure: ENDOSCOPIC CONCHA BULLOSA RESECTION;  Surgeon: Geanie LoganBennett, Paul, MD;  [facility];  Service: ENT;  Laterality: Left;  . IMAGE GUIDED SINUS SURGERY Left 01/25/2020   Procedure: IMAGE GUIDED SINUS SURGERY;  Surgeon: Geanie LoganBennett, Paul, MD;  [facility];  Service: ENT;  Laterality: Left;  PLACED DISK ON OR CHARGE NURSE DESK 3-18  KP  . KNEE ARTHROSCOPY  2004/2008   x2  .  MAXILLARY ANTROSTOMY Left 01/25/2020   Procedure: MAXILLARY ANTROSTOMY with tissue;  Surgeon: Geanie Logan, MD;  Location: [facility];  Service: ENT;  Laterality: Left;  needs styker disk  . NECK SURGERY    . SPINE SURGERY  2008   cervical fusion  . TOTAL ABDOMINAL HYSTERECTOMY W/ BILATERAL SALPINGOOPHORECTOMY  1989  . TOTAL SHOULDER REPLACEMENT Left 12/10/221  . UPPER GI ENDOSCOPY  04-27-13   Dr [name]   Past Surgical History:  Procedure Laterality Date  . ABDOMINAL HYSTERECTOMY    . arm surgery     "muscle came off the elbow"  . AUGMENTATION MAMMAPLASTY    . CESAREAN SECTION    . CHOLECYSTECTOMY    . COLONOSCOPY  2008  . ENDOSCOPIC CONCHA  BULLOSA RESECTION Left 01/25/2020   Procedure: ENDOSCOPIC CONCHA BULLOSA RESECTION;  Surgeon: Geanie Logan, MD;  Location: [facility];  Service: ENT;  Laterality: Left;  . IMAGE GUIDED SINUS SURGERY Left 01/25/2020   Procedure: IMAGE GUIDED SINUS SURGERY;  Surgeon: Geanie Logan, MD;  Location: [facility];  Service: ENT;  Laterality: Left;  PLACED DISK ON OR CHARGE NURSE DESK 3-18  KP  . KNEE ARTHROSCOPY  2004/2008   x2  . MAXILLARY ANTROSTOMY Left 01/25/2020   Procedure: MAXILLARY ANTROSTOMY with tissue;  Surgeon: Geanie Logan, MD;  Location: [facility];  Service: ENT;  Laterality: Left;  needs styker disk  . NECK SURGERY    . SPINE SURGERY  2008   cervical fusion  . TOTAL ABDOMINAL HYSTERECTOMY W/ BILATERAL SALPINGOOPHORECTOMY  1989  . TOTAL SHOULDER REPLACEMENT Left 12/10/221  . UPPER GI ENDOSCOPY  04-27-13   Dr [name]   Past Medical History:  Diagnosis Date  . Arthritis    seronegative inflammatory arthritis/cervical disc disease  . C. difficile colitis 2008  . Chronic cholecystitis 05/07/2013  . Colon polyps 2008  . Complication of anesthesia    BP drops very low  . COVID-19 affecting pregnancy in third trimester 10/28/2019   had negative test first week of March 2021  . GERD (gastroesophageal reflux disease)   . Headache    sinus  . Hiatal hernia 2014  . Hypercholesterolemia   . Mild depression   . Neuromuscular disorder (HCC) 2009   centralized pain syndrome/fibromylagia  . Osteoporosis   . PONV (postoperative nausea and vomiting)    BP 101/70   Pulse 74   Ht 4\' 11"  (1.499 m)   Wt 124 lb 3.2 oz (56.3 kg)   SpO2 96%   BMI 25.09 kg/m   Opioid Risk Score:   Fall Risk Score:  `1  Depression screen [facility] 2/9     03/13/2022   11:02 AM 10/31/2021    8:55 AM 10/12/2021    2:06 PM 08/20/2021   10:01 AM 02/06/2021    2:32 PM 10/31/2020   10:13 AM 08/29/2020   10:12 AM  Depression screen PHQ 2/9  Decreased Interest 1 0 0 0 0  0  Down, Depressed,  Hopeless 3  0 0 0  0  PHQ - 2 Score 4 0 0 0 0  0  Altered sleeping      0   Tired, decreased energy      0      Review of Systems  Constitutional: Negative.   HENT: Negative.    Eyes: Negative.   Respiratory: Negative.    Cardiovascular: Negative.   Gastrointestinal: Negative.   Endocrine: Negative.   Genitourinary: Negative.   Musculoskeletal:  Positive for  back pain and neck pain.  Skin: Negative.   Allergic/Immunologic: Negative.   Neurological: Negative.   Hematological: Negative.   Psychiatric/Behavioral:  Positive for dysphoric mood.       Objective:   Physical Exam Vitals and nursing note reviewed.  Constitutional:      Appearance: Normal appearance.  Cardiovascular:     Rate and Rhythm: Normal rate and regular rhythm.     Pulses: Normal pulses.     Heart sounds: Normal heart sounds.  Pulmonary:     Effort: Pulmonary effort is normal.     Breath sounds: Normal breath sounds. No wheezing.  Musculoskeletal:     Cervical back: Normal range of motion and neck supple.     Comments: Normal Muscle Bulk and Muscle Testing Reveals:  Upper Extremities: Decreased ROM 90 Degrees and Muscle Strength on Right 4/5 and Left 5/5 Bilateral AC Joint Tenderness Thoracic and Lumbar Hypersensitivity Bilateral Greater Trochanter Tenderness Lower Extremities: Full ROM and Muscle Strength 5/5 Arises from Chair with ease Narrow Based  Gait     Skin:    General: Skin is warm and dry.  Neurological:     Mental Status: She is alert and oriented to person, place, and time.  Psychiatric:        Mood and Affect: Mood normal.        Behavior: Behavior normal.         Assessment & Plan:

## 2022-03-14 DIAGNOSIS — E538 Deficiency of other specified B group vitamins: Secondary | ICD-10-CM | POA: Diagnosis not present

## 2022-04-11 ENCOUNTER — Other Ambulatory Visit: Payer: Self-pay | Admitting: Registered Nurse

## 2022-04-15 DIAGNOSIS — E538 Deficiency of other specified B group vitamins: Secondary | ICD-10-CM | POA: Diagnosis not present

## 2022-04-17 DIAGNOSIS — M810 Age-related osteoporosis without current pathological fracture: Secondary | ICD-10-CM | POA: Diagnosis not present

## 2022-04-17 DIAGNOSIS — E042 Nontoxic multinodular goiter: Secondary | ICD-10-CM | POA: Diagnosis not present

## 2022-05-08 ENCOUNTER — Encounter: Payer: Medicare HMO | Attending: Registered Nurse | Admitting: Registered Nurse

## 2022-05-08 VITALS — BP 99/64 | HR 65 | Ht 59.0 in | Wt 121.6 lb

## 2022-05-08 DIAGNOSIS — Z79891 Long term (current) use of opiate analgesic: Secondary | ICD-10-CM

## 2022-05-08 DIAGNOSIS — M542 Cervicalgia: Secondary | ICD-10-CM | POA: Diagnosis not present

## 2022-05-08 DIAGNOSIS — M170 Bilateral primary osteoarthritis of knee: Secondary | ICD-10-CM | POA: Diagnosis not present

## 2022-05-08 DIAGNOSIS — M7061 Trochanteric bursitis, right hip: Secondary | ICD-10-CM

## 2022-05-08 DIAGNOSIS — Z5181 Encounter for therapeutic drug level monitoring: Secondary | ICD-10-CM | POA: Diagnosis present

## 2022-05-08 DIAGNOSIS — G894 Chronic pain syndrome: Secondary | ICD-10-CM

## 2022-05-08 DIAGNOSIS — M7062 Trochanteric bursitis, left hip: Secondary | ICD-10-CM

## 2022-05-08 DIAGNOSIS — M5412 Radiculopathy, cervical region: Secondary | ICD-10-CM

## 2022-05-08 DIAGNOSIS — M797 Fibromyalgia: Secondary | ICD-10-CM

## 2022-05-08 DIAGNOSIS — M961 Postlaminectomy syndrome, not elsewhere classified: Secondary | ICD-10-CM | POA: Diagnosis not present

## 2022-05-08 DIAGNOSIS — M47816 Spondylosis without myelopathy or radiculopathy, lumbar region: Secondary | ICD-10-CM

## 2022-05-08 MED ORDER — HYDROCODONE-ACETAMINOPHEN 10-325 MG PO TABS: 1.0000 | ORAL_TABLET | Freq: Four times a day (QID) | ORAL | 0 refills | Status: DC | PRN
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
## 2022-05-16 ENCOUNTER — Encounter: Payer: Self-pay | Admitting: Registered Nurse

## 2022-05-29 DIAGNOSIS — M810 Age-related osteoporosis without current pathological fracture: Secondary | ICD-10-CM | POA: Diagnosis not present

## 2022-06-10 DIAGNOSIS — Z0000 Encounter for general adult medical examination without abnormal findings: Secondary | ICD-10-CM | POA: Diagnosis not present

## 2022-06-10 DIAGNOSIS — L237 Allergic contact dermatitis due to plants, except food: Secondary | ICD-10-CM | POA: Diagnosis not present

## 2022-06-10 DIAGNOSIS — E538 Deficiency of other specified B group vitamins: Secondary | ICD-10-CM | POA: Diagnosis not present

## 2022-06-10 DIAGNOSIS — F5104 Psychophysiologic insomnia: Secondary | ICD-10-CM | POA: Diagnosis not present

## 2022-06-10 DIAGNOSIS — Z1389 Encounter for screening for other disorder: Secondary | ICD-10-CM | POA: Diagnosis not present

## 2022-06-11 DIAGNOSIS — R69 Illness, unspecified: Secondary | ICD-10-CM | POA: Diagnosis not present

## 2022-06-19 DIAGNOSIS — E042 Nontoxic multinodular goiter: Secondary | ICD-10-CM | POA: Diagnosis not present

## 2022-07-03 ENCOUNTER — Encounter: Payer: Medicare HMO | Attending: Registered Nurse | Admitting: Registered Nurse

## 2022-07-03 ENCOUNTER — Encounter: Payer: Self-pay | Admitting: Registered Nurse

## 2022-07-03 VITALS — BP 101/64 | HR 68 | Ht 59.0 in | Wt 119.0 lb

## 2022-07-03 DIAGNOSIS — M170 Bilateral primary osteoarthritis of knee: Secondary | ICD-10-CM | POA: Diagnosis present

## 2022-07-03 DIAGNOSIS — Z79891 Long term (current) use of opiate analgesic: Secondary | ICD-10-CM | POA: Diagnosis not present

## 2022-07-03 DIAGNOSIS — Z5181 Encounter for therapeutic drug level monitoring: Secondary | ICD-10-CM | POA: Diagnosis not present

## 2022-07-03 DIAGNOSIS — M797 Fibromyalgia: Secondary | ICD-10-CM | POA: Diagnosis present

## 2022-07-03 DIAGNOSIS — M7062 Trochanteric bursitis, left hip: Secondary | ICD-10-CM | POA: Diagnosis not present

## 2022-07-03 DIAGNOSIS — M542 Cervicalgia: Secondary | ICD-10-CM

## 2022-07-03 DIAGNOSIS — M7061 Trochanteric bursitis, right hip: Secondary | ICD-10-CM

## 2022-07-03 DIAGNOSIS — M47816 Spondylosis without myelopathy or radiculopathy, lumbar region: Secondary | ICD-10-CM | POA: Diagnosis not present

## 2022-07-03 DIAGNOSIS — M961 Postlaminectomy syndrome, not elsewhere classified: Secondary | ICD-10-CM | POA: Diagnosis not present

## 2022-07-03 DIAGNOSIS — M5412 Radiculopathy, cervical region: Secondary | ICD-10-CM

## 2022-07-03 DIAGNOSIS — G894 Chronic pain syndrome: Secondary | ICD-10-CM

## 2022-07-03 MED ORDER — HYDROCODONE-ACETAMINOPHEN 10-325 MG PO TABS: 1.0000 | ORAL_TABLET | Freq: Four times a day (QID) | ORAL | 0 refills | Status: DC | PRN
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
## 2022-07-04 ENCOUNTER — Encounter: Payer: Medicare HMO | Admitting: Registered Nurse

## 2022-07-06 LAB — TOXASSURE SELECT,+ANTIDEPR,UR

## 2022-07-12 ENCOUNTER — Telehealth: Payer: Self-pay | Admitting: *Deleted

## 2022-07-12 NOTE — Telephone Encounter (Signed)
Urine drug screen for this encounter is consistent for prescribed medication 

## 2022-08-06 DIAGNOSIS — E538 Deficiency of other specified B group vitamins: Secondary | ICD-10-CM | POA: Diagnosis not present

## 2022-09-02 DIAGNOSIS — J329 Chronic sinusitis, unspecified: Secondary | ICD-10-CM | POA: Diagnosis not present

## 2022-09-02 DIAGNOSIS — Z2989 Encounter for other specified prophylactic measures: Secondary | ICD-10-CM | POA: Diagnosis not present

## 2022-09-04 ENCOUNTER — Encounter: Payer: Self-pay | Admitting: Registered Nurse

## 2022-09-04 ENCOUNTER — Encounter: Payer: Medicare HMO | Attending: Registered Nurse | Admitting: Registered Nurse

## 2022-09-04 VITALS — BP 91/62 | HR 88 | Ht 59.0 in | Wt 117.0 lb

## 2022-09-04 DIAGNOSIS — M7061 Trochanteric bursitis, right hip: Secondary | ICD-10-CM | POA: Diagnosis not present

## 2022-09-04 DIAGNOSIS — G894 Chronic pain syndrome: Secondary | ICD-10-CM | POA: Diagnosis not present

## 2022-09-04 DIAGNOSIS — Z79891 Long term (current) use of opiate analgesic: Secondary | ICD-10-CM | POA: Diagnosis not present

## 2022-09-04 DIAGNOSIS — M7062 Trochanteric bursitis, left hip: Secondary | ICD-10-CM | POA: Diagnosis not present

## 2022-09-04 DIAGNOSIS — M542 Cervicalgia: Secondary | ICD-10-CM | POA: Insufficient documentation

## 2022-09-04 DIAGNOSIS — M5412 Radiculopathy, cervical region: Secondary | ICD-10-CM | POA: Diagnosis not present

## 2022-09-04 DIAGNOSIS — M47816 Spondylosis without myelopathy or radiculopathy, lumbar region: Secondary | ICD-10-CM | POA: Insufficient documentation

## 2022-09-04 DIAGNOSIS — G8929 Other chronic pain: Secondary | ICD-10-CM | POA: Insufficient documentation

## 2022-09-04 DIAGNOSIS — M546 Pain in thoracic spine: Secondary | ICD-10-CM | POA: Diagnosis not present

## 2022-09-04 DIAGNOSIS — Z5181 Encounter for therapeutic drug level monitoring: Secondary | ICD-10-CM | POA: Diagnosis not present

## 2022-09-04 MED ORDER — HYDROCODONE-ACETAMINOPHEN 10-325 MG PO TABS: 1.0000 | ORAL_TABLET | Freq: Four times a day (QID) | ORAL | 0 refills | Status: DC | PRN
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
## 2022-09-10 DIAGNOSIS — E538 Deficiency of other specified B group vitamins: Secondary | ICD-10-CM | POA: Diagnosis not present

## 2022-10-11 DIAGNOSIS — E538 Deficiency of other specified B group vitamins: Secondary | ICD-10-CM | POA: Diagnosis not present

## 2022-10-30 ENCOUNTER — Encounter: Payer: Medicare HMO | Attending: Registered Nurse | Admitting: Registered Nurse

## 2022-10-30 VITALS — BP 108/71 | HR 74 | Ht 59.0 in | Wt 123.0 lb

## 2022-10-30 DIAGNOSIS — M542 Cervicalgia: Secondary | ICD-10-CM | POA: Diagnosis not present

## 2022-10-30 DIAGNOSIS — Z79891 Long term (current) use of opiate analgesic: Secondary | ICD-10-CM | POA: Insufficient documentation

## 2022-10-30 DIAGNOSIS — M7061 Trochanteric bursitis, right hip: Secondary | ICD-10-CM | POA: Insufficient documentation

## 2022-10-30 DIAGNOSIS — G894 Chronic pain syndrome: Secondary | ICD-10-CM | POA: Diagnosis not present

## 2022-10-30 DIAGNOSIS — M546 Pain in thoracic spine: Secondary | ICD-10-CM | POA: Diagnosis not present

## 2022-10-30 DIAGNOSIS — G8929 Other chronic pain: Secondary | ICD-10-CM | POA: Diagnosis not present

## 2022-10-30 DIAGNOSIS — M5412 Radiculopathy, cervical region: Secondary | ICD-10-CM | POA: Diagnosis not present

## 2022-10-30 DIAGNOSIS — Z5181 Encounter for therapeutic drug level monitoring: Secondary | ICD-10-CM | POA: Insufficient documentation

## 2022-10-30 DIAGNOSIS — M7062 Trochanteric bursitis, left hip: Secondary | ICD-10-CM | POA: Insufficient documentation

## 2022-10-30 DIAGNOSIS — M47816 Spondylosis without myelopathy or radiculopathy, lumbar region: Secondary | ICD-10-CM | POA: Diagnosis not present

## 2022-10-30 MED ORDER — HYDROCODONE-ACETAMINOPHEN 10-325 MG PO TABS: 1.0000 | ORAL_TABLET | Freq: Four times a day (QID) | ORAL | 0 refills | Status: DC | PRN
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
## 2022-11-03 ENCOUNTER — Encounter: Payer: Self-pay | Admitting: Registered Nurse

## 2022-12-06 ENCOUNTER — Institutional Professional Consult (permissible substitution): Payer: Medicare HMO | Admitting: Plastic Surgery

## 2022-12-10 DIAGNOSIS — H66012 Acute suppurative otitis media with spontaneous rupture of ear drum, left ear: Secondary | ICD-10-CM | POA: Diagnosis not present

## 2022-12-13 DIAGNOSIS — E538 Deficiency of other specified B group vitamins: Secondary | ICD-10-CM | POA: Diagnosis not present

## 2022-12-24 DIAGNOSIS — Z96612 Presence of left artificial shoulder joint: Secondary | ICD-10-CM | POA: Diagnosis not present

## 2022-12-24 DIAGNOSIS — S46012A Strain of muscle(s) and tendon(s) of the rotator cuff of left shoulder, initial encounter: Secondary | ICD-10-CM | POA: Diagnosis not present

## 2022-12-24 DIAGNOSIS — X58XXXA Exposure to other specified factors, initial encounter: Secondary | ICD-10-CM | POA: Diagnosis not present

## 2022-12-25 ENCOUNTER — Other Ambulatory Visit: Payer: Self-pay | Admitting: Orthopedic Surgery

## 2022-12-25 DIAGNOSIS — E559 Vitamin D deficiency, unspecified: Secondary | ICD-10-CM | POA: Diagnosis not present

## 2022-12-25 DIAGNOSIS — E782 Mixed hyperlipidemia: Secondary | ICD-10-CM | POA: Diagnosis not present

## 2022-12-25 DIAGNOSIS — S46012A Strain of muscle(s) and tendon(s) of the rotator cuff of left shoulder, initial encounter: Secondary | ICD-10-CM

## 2022-12-25 DIAGNOSIS — E538 Deficiency of other specified B group vitamins: Secondary | ICD-10-CM | POA: Diagnosis not present

## 2022-12-30 ENCOUNTER — Encounter: Payer: Medicare HMO | Attending: Registered Nurse | Admitting: Registered Nurse

## 2022-12-30 VITALS — BP 96/64 | HR 81 | Ht <= 58 in | Wt 123.8 lb

## 2022-12-30 DIAGNOSIS — M47816 Spondylosis without myelopathy or radiculopathy, lumbar region: Secondary | ICD-10-CM | POA: Insufficient documentation

## 2022-12-30 DIAGNOSIS — M25512 Pain in left shoulder: Secondary | ICD-10-CM | POA: Insufficient documentation

## 2022-12-30 DIAGNOSIS — M5412 Radiculopathy, cervical region: Secondary | ICD-10-CM | POA: Diagnosis not present

## 2022-12-30 DIAGNOSIS — G8929 Other chronic pain: Secondary | ICD-10-CM | POA: Insufficient documentation

## 2022-12-30 DIAGNOSIS — M25511 Pain in right shoulder: Secondary | ICD-10-CM | POA: Diagnosis not present

## 2022-12-30 DIAGNOSIS — G894 Chronic pain syndrome: Secondary | ICD-10-CM | POA: Insufficient documentation

## 2022-12-30 DIAGNOSIS — Z79891 Long term (current) use of opiate analgesic: Secondary | ICD-10-CM | POA: Diagnosis not present

## 2022-12-30 DIAGNOSIS — M546 Pain in thoracic spine: Secondary | ICD-10-CM | POA: Diagnosis not present

## 2022-12-30 DIAGNOSIS — Z5181 Encounter for therapeutic drug level monitoring: Secondary | ICD-10-CM | POA: Diagnosis not present

## 2022-12-30 DIAGNOSIS — M170 Bilateral primary osteoarthritis of knee: Secondary | ICD-10-CM | POA: Diagnosis present

## 2022-12-30 DIAGNOSIS — M542 Cervicalgia: Secondary | ICD-10-CM | POA: Insufficient documentation

## 2022-12-30 DIAGNOSIS — M7061 Trochanteric bursitis, right hip: Secondary | ICD-10-CM | POA: Insufficient documentation

## 2022-12-30 DIAGNOSIS — M7062 Trochanteric bursitis, left hip: Secondary | ICD-10-CM | POA: Insufficient documentation

## 2022-12-30 MED ORDER — HYDROCODONE-ACETAMINOPHEN 10-325 MG PO TABS: 1.0000 | ORAL_TABLET | Freq: Four times a day (QID) | ORAL | 0 refills | Status: DC | PRN
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
## 2023-01-01 DIAGNOSIS — Z0000 Encounter for general adult medical examination without abnormal findings: Secondary | ICD-10-CM | POA: Diagnosis not present

## 2023-01-02 LAB — TOXASSURE SELECT,+ANTIDEPR,UR

## 2023-01-04 ENCOUNTER — Encounter: Payer: Self-pay | Admitting: Registered Nurse

## 2023-01-08 DIAGNOSIS — H66012 Acute suppurative otitis media with spontaneous rupture of ear drum, left ear: Secondary | ICD-10-CM | POA: Diagnosis not present

## 2023-01-08 DIAGNOSIS — H903 Sensorineural hearing loss, bilateral: Secondary | ICD-10-CM | POA: Diagnosis not present

## 2023-01-13 ENCOUNTER — Ambulatory Visit
Admission: RE | Admit: 2023-01-13 | Discharge: 2023-01-13 | Disposition: A | Discharge status: Home / Self Care | Payer: Medicare HMO | Source: Ambulatory Visit | Attending: Orthopedic Surgery | Admitting: Orthopedic Surgery

## 2023-01-13 DIAGNOSIS — Z96612 Presence of left artificial shoulder joint: Secondary | ICD-10-CM | POA: Diagnosis not present

## 2023-01-13 DIAGNOSIS — S46012A Strain of muscle(s) and tendon(s) of the rotator cuff of left shoulder, initial encounter: Secondary | ICD-10-CM

## 2023-01-13 DIAGNOSIS — E538 Deficiency of other specified B group vitamins: Secondary | ICD-10-CM | POA: Diagnosis not present

## 2023-01-13 DIAGNOSIS — M25512 Pain in left shoulder: Secondary | ICD-10-CM | POA: Diagnosis not present

## 2023-01-13 DIAGNOSIS — Z471 Aftercare following joint replacement surgery: Secondary | ICD-10-CM | POA: Diagnosis not present

## 2023-01-13 DIAGNOSIS — M25612 Stiffness of left shoulder, not elsewhere classified: Secondary | ICD-10-CM | POA: Diagnosis not present

## 2023-01-13 MED ORDER — IOPAMIDOL (ISOVUE-M 200) INJECTION 41%
15.0000 mL | Freq: Once | INTRAMUSCULAR | Status: AC
  Administered 2023-01-13: 15 mL via INTRA_ARTICULAR

## 2023-01-14 ENCOUNTER — Telehealth: Payer: Self-pay | Admitting: Registered Nurse

## 2023-01-14 MED ORDER — HYDROCODONE-ACETAMINOPHEN 10-325 MG PO TABS: 1.0000 | ORAL_TABLET | Freq: Four times a day (QID) | ORAL | 0 refills | Status: DC | PRN

## 2023-01-14 NOTE — Telephone Encounter (Signed)
Please send 2nd month of prescriptions

## 2023-01-14 NOTE — Telephone Encounter (Signed)
PMP was Reviewed.  Hydrocodone e-scribed with April post dated prescription. Kathryn Farrell is aware of the above and verbalizes understanding.

## 2023-01-15 ENCOUNTER — Telehealth: Payer: Self-pay | Admitting: *Deleted

## 2023-01-15 NOTE — Telephone Encounter (Signed)
Urine drug screen for this encounter is consistent for prescribed medication 

## 2023-01-21 DIAGNOSIS — M25511 Pain in right shoulder: Secondary | ICD-10-CM | POA: Diagnosis not present

## 2023-01-21 DIAGNOSIS — M25512 Pain in left shoulder: Secondary | ICD-10-CM | POA: Diagnosis not present

## 2023-01-21 DIAGNOSIS — M19011 Primary osteoarthritis, right shoulder: Secondary | ICD-10-CM | POA: Diagnosis not present

## 2023-01-21 DIAGNOSIS — Z96612 Presence of left artificial shoulder joint: Secondary | ICD-10-CM | POA: Diagnosis not present

## 2023-02-17 DIAGNOSIS — E538 Deficiency of other specified B group vitamins: Secondary | ICD-10-CM | POA: Diagnosis not present
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
## 2023-02-28 ENCOUNTER — Encounter: Payer: Self-pay | Admitting: Registered Nurse

## 2023-02-28 ENCOUNTER — Encounter: Payer: Medicare HMO | Attending: Registered Nurse | Admitting: Registered Nurse

## 2023-02-28 VITALS — BP 101/66 | HR 73 | Ht <= 58 in | Wt 126.0 lb

## 2023-02-28 DIAGNOSIS — Z79891 Long term (current) use of opiate analgesic: Secondary | ICD-10-CM | POA: Diagnosis present

## 2023-02-28 DIAGNOSIS — Z5181 Encounter for therapeutic drug level monitoring: Secondary | ICD-10-CM | POA: Diagnosis not present

## 2023-02-28 DIAGNOSIS — M25511 Pain in right shoulder: Secondary | ICD-10-CM

## 2023-02-28 DIAGNOSIS — M546 Pain in thoracic spine: Secondary | ICD-10-CM | POA: Diagnosis not present

## 2023-02-28 DIAGNOSIS — M170 Bilateral primary osteoarthritis of knee: Secondary | ICD-10-CM

## 2023-02-28 DIAGNOSIS — M542 Cervicalgia: Secondary | ICD-10-CM | POA: Diagnosis not present

## 2023-02-28 DIAGNOSIS — M25512 Pain in left shoulder: Secondary | ICD-10-CM

## 2023-02-28 DIAGNOSIS — G8929 Other chronic pain: Secondary | ICD-10-CM | POA: Diagnosis not present

## 2023-02-28 DIAGNOSIS — M5412 Radiculopathy, cervical region: Secondary | ICD-10-CM | POA: Diagnosis not present

## 2023-02-28 DIAGNOSIS — M7061 Trochanteric bursitis, right hip: Secondary | ICD-10-CM | POA: Diagnosis not present

## 2023-02-28 DIAGNOSIS — G894 Chronic pain syndrome: Secondary | ICD-10-CM | POA: Diagnosis not present

## 2023-02-28 DIAGNOSIS — M7062 Trochanteric bursitis, left hip: Secondary | ICD-10-CM

## 2023-02-28 DIAGNOSIS — M47816 Spondylosis without myelopathy or radiculopathy, lumbar region: Secondary | ICD-10-CM | POA: Diagnosis not present

## 2023-02-28 MED ORDER — HYDROCODONE-ACETAMINOPHEN 10-325 MG PO TABS: 1.0000 | ORAL_TABLET | Freq: Four times a day (QID) | ORAL | 0 refills | Status: DC | PRN

## 2023-03-06 ENCOUNTER — Telehealth: Payer: Self-pay | Admitting: Obstetrics and Gynecology

## 2023-03-06 NOTE — Telephone Encounter (Signed)
Left message for patient to call office back to schedule annual appt with Helmut Muster

## 2023-03-11 NOTE — Telephone Encounter (Signed)
LM x 2 for patient to call office back to schedule annual appt 

## 2023-03-25 DIAGNOSIS — E538 Deficiency of other specified B group vitamins: Secondary | ICD-10-CM | POA: Diagnosis not present

## 2023-04-07 DIAGNOSIS — R1013 Epigastric pain: Secondary | ICD-10-CM | POA: Diagnosis not present

## 2023-04-07 DIAGNOSIS — F5104 Psychophysiologic insomnia: Secondary | ICD-10-CM | POA: Diagnosis not present

## 2023-04-23 DIAGNOSIS — E042 Nontoxic multinodular goiter: Secondary | ICD-10-CM | POA: Diagnosis not present

## 2023-04-23 DIAGNOSIS — M810 Age-related osteoporosis without current pathological fracture: Secondary | ICD-10-CM | POA: Diagnosis not present

## 2023-04-25 DIAGNOSIS — E538 Deficiency of other specified B group vitamins: Secondary | ICD-10-CM | POA: Diagnosis not present

## 2023-04-28 ENCOUNTER — Encounter: Payer: Self-pay | Admitting: Registered Nurse

## 2023-04-28 ENCOUNTER — Encounter: Payer: Medicare HMO | Attending: Registered Nurse | Admitting: Registered Nurse

## 2023-04-28 VITALS — BP 100/66 | HR 74 | Ht <= 58 in | Wt 131.0 lb

## 2023-04-28 DIAGNOSIS — M170 Bilateral primary osteoarthritis of knee: Secondary | ICD-10-CM | POA: Insufficient documentation

## 2023-04-28 DIAGNOSIS — M25511 Pain in right shoulder: Secondary | ICD-10-CM | POA: Insufficient documentation

## 2023-04-28 DIAGNOSIS — M47816 Spondylosis without myelopathy or radiculopathy, lumbar region: Secondary | ICD-10-CM | POA: Diagnosis not present

## 2023-04-28 DIAGNOSIS — M546 Pain in thoracic spine: Secondary | ICD-10-CM | POA: Diagnosis not present

## 2023-04-28 DIAGNOSIS — M7061 Trochanteric bursitis, right hip: Secondary | ICD-10-CM | POA: Insufficient documentation

## 2023-04-28 DIAGNOSIS — M5412 Radiculopathy, cervical region: Secondary | ICD-10-CM | POA: Diagnosis not present

## 2023-04-28 DIAGNOSIS — G8929 Other chronic pain: Secondary | ICD-10-CM | POA: Insufficient documentation

## 2023-04-28 DIAGNOSIS — M542 Cervicalgia: Secondary | ICD-10-CM | POA: Diagnosis not present

## 2023-04-28 DIAGNOSIS — Z5181 Encounter for therapeutic drug level monitoring: Secondary | ICD-10-CM | POA: Insufficient documentation

## 2023-04-28 DIAGNOSIS — M25512 Pain in left shoulder: Secondary | ICD-10-CM | POA: Insufficient documentation

## 2023-04-28 DIAGNOSIS — Z79891 Long term (current) use of opiate analgesic: Secondary | ICD-10-CM | POA: Diagnosis present

## 2023-04-28 DIAGNOSIS — M7062 Trochanteric bursitis, left hip: Secondary | ICD-10-CM | POA: Insufficient documentation

## 2023-04-28 DIAGNOSIS — G894 Chronic pain syndrome: Secondary | ICD-10-CM | POA: Insufficient documentation

## 2023-04-28 MED ORDER — HYDROCODONE-ACETAMINOPHEN 10-325 MG PO TABS: 1.0000 | ORAL_TABLET | Freq: Four times a day (QID) | ORAL | 0 refills | Status: DC | PRN
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
## 2023-05-26 DIAGNOSIS — M25531 Pain in right wrist: Secondary | ICD-10-CM | POA: Diagnosis not present

## 2023-05-26 DIAGNOSIS — E538 Deficiency of other specified B group vitamins: Secondary | ICD-10-CM | POA: Diagnosis not present

## 2023-06-02 ENCOUNTER — Other Ambulatory Visit: Payer: Self-pay | Admitting: Registered Nurse

## 2023-06-02 ENCOUNTER — Telehealth: Payer: Self-pay

## 2023-06-02 NOTE — Telephone Encounter (Signed)
PMP was Reviewed.  Hydrocodone e-scribed to pharmacy.  Call placed to Kathryn Farrell regarding the above, she verbalizes understanding.

## 2023-06-02 NOTE — Telephone Encounter (Signed)
Patient called state the Rx was successful.

## 2023-06-10 DIAGNOSIS — M25531 Pain in right wrist: Secondary | ICD-10-CM | POA: Diagnosis not present

## 2023-06-26 DIAGNOSIS — E538 Deficiency of other specified B group vitamins: Secondary | ICD-10-CM | POA: Diagnosis not present

## 2023-07-02 ENCOUNTER — Encounter: Payer: Medicare HMO | Attending: Registered Nurse | Admitting: Registered Nurse

## 2023-07-02 ENCOUNTER — Encounter: Payer: Self-pay | Admitting: Registered Nurse

## 2023-07-02 VITALS — BP 96/65 | HR 90 | Ht <= 58 in | Wt 128.0 lb

## 2023-07-02 DIAGNOSIS — M546 Pain in thoracic spine: Secondary | ICD-10-CM | POA: Insufficient documentation

## 2023-07-02 DIAGNOSIS — M5412 Radiculopathy, cervical region: Secondary | ICD-10-CM | POA: Diagnosis not present

## 2023-07-02 DIAGNOSIS — M25511 Pain in right shoulder: Secondary | ICD-10-CM | POA: Insufficient documentation

## 2023-07-02 DIAGNOSIS — M47816 Spondylosis without myelopathy or radiculopathy, lumbar region: Secondary | ICD-10-CM

## 2023-07-02 DIAGNOSIS — M7061 Trochanteric bursitis, right hip: Secondary | ICD-10-CM

## 2023-07-02 DIAGNOSIS — M7062 Trochanteric bursitis, left hip: Secondary | ICD-10-CM | POA: Diagnosis not present

## 2023-07-02 DIAGNOSIS — M25512 Pain in left shoulder: Secondary | ICD-10-CM | POA: Insufficient documentation

## 2023-07-02 DIAGNOSIS — M542 Cervicalgia: Secondary | ICD-10-CM | POA: Diagnosis not present

## 2023-07-02 DIAGNOSIS — M170 Bilateral primary osteoarthritis of knee: Secondary | ICD-10-CM

## 2023-07-02 DIAGNOSIS — Z5181 Encounter for therapeutic drug level monitoring: Secondary | ICD-10-CM

## 2023-07-02 DIAGNOSIS — G894 Chronic pain syndrome: Secondary | ICD-10-CM | POA: Diagnosis not present

## 2023-07-02 DIAGNOSIS — G8929 Other chronic pain: Secondary | ICD-10-CM

## 2023-07-02 DIAGNOSIS — Z79891 Long term (current) use of opiate analgesic: Secondary | ICD-10-CM | POA: Diagnosis not present

## 2023-07-02 MED ORDER — HYDROCODONE-ACETAMINOPHEN 10-325 MG PO TABS: 1.0000 | ORAL_TABLET | Freq: Four times a day (QID) | ORAL | 0 refills | Status: DC | PRN
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
## 2023-07-08 LAB — TOXASSURE SELECT,+ANTIDEPR,UR

## 2023-07-11 DIAGNOSIS — M79672 Pain in left foot: Secondary | ICD-10-CM | POA: Diagnosis not present

## 2023-07-11 DIAGNOSIS — M25775 Osteophyte, left foot: Secondary | ICD-10-CM | POA: Diagnosis not present

## 2023-07-11 DIAGNOSIS — L600 Ingrowing nail: Secondary | ICD-10-CM | POA: Diagnosis not present

## 2023-07-22 DIAGNOSIS — L03032 Cellulitis of left toe: Secondary | ICD-10-CM | POA: Diagnosis not present

## 2023-07-28 DIAGNOSIS — E538 Deficiency of other specified B group vitamins: Secondary | ICD-10-CM | POA: Diagnosis not present

## 2023-07-30 ENCOUNTER — Other Ambulatory Visit: Payer: Self-pay | Admitting: Registered Nurse

## 2023-07-31 DIAGNOSIS — M21612 Bunion of left foot: Secondary | ICD-10-CM | POA: Diagnosis not present

## 2023-07-31 DIAGNOSIS — L97521 Non-pressure chronic ulcer of other part of left foot limited to breakdown of skin: Secondary | ICD-10-CM | POA: Diagnosis not present

## 2023-08-08 ENCOUNTER — Telehealth: Payer: Self-pay | Admitting: Physical Medicine & Rehabilitation

## 2023-08-08 NOTE — Telephone Encounter (Signed)
Patient called in and would like to ask the name of medication she was once prescribed by Dr Riley Kill 6-7 years ago that caused her an allergic reaction .

## 2023-08-12 ENCOUNTER — Telehealth: Payer: Self-pay | Admitting: Physical Medicine & Rehabilitation

## 2023-08-12 DIAGNOSIS — M65872 Other synovitis and tenosynovitis, left ankle and foot: Secondary | ICD-10-CM | POA: Diagnosis not present

## 2023-08-12 DIAGNOSIS — M25572 Pain in left ankle and joints of left foot: Secondary | ICD-10-CM | POA: Diagnosis not present

## 2023-08-12 NOTE — Telephone Encounter (Signed)
Patient called back and states she has not heard back and is requesting to know if someone can help her , she is going to another doctor appointment today and wants to ensure she not given anything that she was allergic to because she doesn't see the name of the medicine in her mychart

## 2023-08-12 NOTE — Telephone Encounter (Signed)
Called patient back to confirm medication was Carbamazepine (Tegretol). Per note on 08/28/2016 and patient confirmed

## 2023-08-13 ENCOUNTER — Other Ambulatory Visit: Payer: Self-pay | Admitting: Podiatry

## 2023-08-13 DIAGNOSIS — M86172 Other acute osteomyelitis, left ankle and foot: Secondary | ICD-10-CM

## 2023-08-16 ENCOUNTER — Ambulatory Visit
Admission: RE | Admit: 2023-08-16 | Discharge: 2023-08-16 | Disposition: A | Discharge status: Home / Self Care | Payer: Medicare HMO | Source: Ambulatory Visit | Attending: Podiatry | Admitting: Podiatry

## 2023-08-16 DIAGNOSIS — M79672 Pain in left foot: Secondary | ICD-10-CM | POA: Diagnosis not present

## 2023-08-16 DIAGNOSIS — M86172 Other acute osteomyelitis, left ankle and foot: Secondary | ICD-10-CM | POA: Diagnosis not present

## 2023-08-16 MED ORDER — GADOBUTROL 1 MMOL/ML IV SOLN
5.0000 mL | Freq: Once | INTRAVENOUS | Status: AC | PRN
  Administered 2023-08-16: 5 mL via INTRAVENOUS
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
## 2023-08-28 DIAGNOSIS — E538 Deficiency of other specified B group vitamins: Secondary | ICD-10-CM | POA: Diagnosis not present

## 2023-08-29 ENCOUNTER — Encounter: Payer: Self-pay | Admitting: Registered Nurse

## 2023-08-29 ENCOUNTER — Encounter: Payer: Medicare HMO | Attending: Registered Nurse | Admitting: Registered Nurse

## 2023-08-29 VITALS — BP 90/64 | HR 77 | Ht <= 58 in | Wt 130.0 lb

## 2023-08-29 DIAGNOSIS — G8929 Other chronic pain: Secondary | ICD-10-CM | POA: Insufficient documentation

## 2023-08-29 DIAGNOSIS — Z79891 Long term (current) use of opiate analgesic: Secondary | ICD-10-CM | POA: Diagnosis not present

## 2023-08-29 DIAGNOSIS — M47816 Spondylosis without myelopathy or radiculopathy, lumbar region: Secondary | ICD-10-CM

## 2023-08-29 DIAGNOSIS — M7062 Trochanteric bursitis, left hip: Secondary | ICD-10-CM | POA: Diagnosis not present

## 2023-08-29 DIAGNOSIS — Z5181 Encounter for therapeutic drug level monitoring: Secondary | ICD-10-CM | POA: Diagnosis not present

## 2023-08-29 DIAGNOSIS — M5412 Radiculopathy, cervical region: Secondary | ICD-10-CM | POA: Diagnosis not present

## 2023-08-29 DIAGNOSIS — M542 Cervicalgia: Secondary | ICD-10-CM | POA: Diagnosis not present

## 2023-08-29 DIAGNOSIS — M7061 Trochanteric bursitis, right hip: Secondary | ICD-10-CM

## 2023-08-29 DIAGNOSIS — M170 Bilateral primary osteoarthritis of knee: Secondary | ICD-10-CM

## 2023-08-29 DIAGNOSIS — M546 Pain in thoracic spine: Secondary | ICD-10-CM | POA: Diagnosis present

## 2023-08-29 DIAGNOSIS — G894 Chronic pain syndrome: Secondary | ICD-10-CM | POA: Diagnosis not present

## 2023-08-29 MED ORDER — HYDROCODONE-ACETAMINOPHEN 10-325 MG PO TABS: 1.0000 | ORAL_TABLET | Freq: Four times a day (QID) | ORAL | 0 refills | Status: DC | PRN

## 2023-10-21 DIAGNOSIS — H6123 Impacted cerumen, bilateral: Secondary | ICD-10-CM | POA: Diagnosis not present

## 2023-10-21 DIAGNOSIS — T43615A Adverse effect of caffeine, initial encounter: Secondary | ICD-10-CM | POA: Diagnosis not present

## 2023-10-21 DIAGNOSIS — H903 Sensorineural hearing loss, bilateral: Secondary | ICD-10-CM | POA: Diagnosis not present

## 2023-10-27 ENCOUNTER — Encounter: Payer: Medicare HMO | Admitting: Registered Nurse
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
## 2023-10-30 ENCOUNTER — Encounter: Payer: Medicare HMO | Attending: Registered Nurse | Admitting: Registered Nurse

## 2023-10-30 ENCOUNTER — Encounter: Payer: Self-pay | Admitting: Registered Nurse

## 2023-10-30 VITALS — BP 96/63 | HR 74 | Ht <= 58 in | Wt 129.0 lb

## 2023-10-30 DIAGNOSIS — M542 Cervicalgia: Secondary | ICD-10-CM | POA: Diagnosis present

## 2023-10-30 DIAGNOSIS — M25511 Pain in right shoulder: Secondary | ICD-10-CM | POA: Insufficient documentation

## 2023-10-30 DIAGNOSIS — Z79891 Long term (current) use of opiate analgesic: Secondary | ICD-10-CM | POA: Insufficient documentation

## 2023-10-30 DIAGNOSIS — M7062 Trochanteric bursitis, left hip: Secondary | ICD-10-CM | POA: Diagnosis present

## 2023-10-30 DIAGNOSIS — E538 Deficiency of other specified B group vitamins: Secondary | ICD-10-CM | POA: Diagnosis not present

## 2023-10-30 DIAGNOSIS — G8929 Other chronic pain: Secondary | ICD-10-CM | POA: Diagnosis not present

## 2023-10-30 DIAGNOSIS — M5412 Radiculopathy, cervical region: Secondary | ICD-10-CM | POA: Diagnosis present

## 2023-10-30 DIAGNOSIS — Z5181 Encounter for therapeutic drug level monitoring: Secondary | ICD-10-CM | POA: Diagnosis not present

## 2023-10-30 DIAGNOSIS — M170 Bilateral primary osteoarthritis of knee: Secondary | ICD-10-CM | POA: Diagnosis not present

## 2023-10-30 DIAGNOSIS — M546 Pain in thoracic spine: Secondary | ICD-10-CM | POA: Insufficient documentation

## 2023-10-30 DIAGNOSIS — G894 Chronic pain syndrome: Secondary | ICD-10-CM | POA: Insufficient documentation

## 2023-10-30 DIAGNOSIS — M47816 Spondylosis without myelopathy or radiculopathy, lumbar region: Secondary | ICD-10-CM | POA: Diagnosis present

## 2023-10-30 DIAGNOSIS — M25512 Pain in left shoulder: Secondary | ICD-10-CM | POA: Diagnosis present

## 2023-10-30 DIAGNOSIS — M7061 Trochanteric bursitis, right hip: Secondary | ICD-10-CM | POA: Diagnosis present

## 2023-10-30 MED ORDER — HYDROCODONE-ACETAMINOPHEN 10-325 MG PO TABS: 1.0000 | ORAL_TABLET | Freq: Four times a day (QID) | ORAL | 0 refills | Status: DC | PRN

## 2023-11-03 LAB — TOXASSURE SELECT,+ANTIDEPR,UR

## 2023-12-01 ENCOUNTER — Other Ambulatory Visit: Payer: Self-pay | Admitting: Registered Nurse

## 2023-12-01 DIAGNOSIS — L820 Inflamed seborrheic keratosis: Secondary | ICD-10-CM | POA: Diagnosis not present

## 2023-12-01 DIAGNOSIS — D485 Neoplasm of uncertain behavior of skin: Secondary | ICD-10-CM | POA: Diagnosis not present

## 2023-12-01 DIAGNOSIS — L814 Other melanin hyperpigmentation: Secondary | ICD-10-CM | POA: Diagnosis not present

## 2023-12-01 DIAGNOSIS — E538 Deficiency of other specified B group vitamins: Secondary | ICD-10-CM | POA: Diagnosis not present

## 2023-12-01 NOTE — Telephone Encounter (Signed)
Per pmp last filled on:  10/31/2023 10/30/2023 2  Hydrocodone-Acetamin 10-325 Mg 120.00 30 Eu Tho [id number] ([id number]) 0/0 40.00 MME Comm Ins [place]

## 2023-12-29 ENCOUNTER — Encounter: Payer: Medicare HMO | Attending: Registered Nurse | Admitting: Registered Nurse

## 2023-12-29 VITALS — BP 96/61 | HR 76 | Ht <= 58 in | Wt 127.0 lb

## 2023-12-29 DIAGNOSIS — M47816 Spondylosis without myelopathy or radiculopathy, lumbar region: Secondary | ICD-10-CM

## 2023-12-29 DIAGNOSIS — M7062 Trochanteric bursitis, left hip: Secondary | ICD-10-CM

## 2023-12-29 DIAGNOSIS — M25511 Pain in right shoulder: Secondary | ICD-10-CM | POA: Diagnosis not present

## 2023-12-29 DIAGNOSIS — M7061 Trochanteric bursitis, right hip: Secondary | ICD-10-CM

## 2023-12-29 DIAGNOSIS — G8929 Other chronic pain: Secondary | ICD-10-CM | POA: Diagnosis not present

## 2023-12-29 DIAGNOSIS — M546 Pain in thoracic spine: Secondary | ICD-10-CM

## 2023-12-29 DIAGNOSIS — M5412 Radiculopathy, cervical region: Secondary | ICD-10-CM | POA: Diagnosis not present

## 2023-12-29 DIAGNOSIS — M542 Cervicalgia: Secondary | ICD-10-CM | POA: Diagnosis not present

## 2023-12-29 DIAGNOSIS — M25512 Pain in left shoulder: Secondary | ICD-10-CM

## 2023-12-29 DIAGNOSIS — Z79891 Long term (current) use of opiate analgesic: Secondary | ICD-10-CM | POA: Diagnosis present

## 2023-12-29 DIAGNOSIS — M170 Bilateral primary osteoarthritis of knee: Secondary | ICD-10-CM

## 2023-12-29 DIAGNOSIS — G894 Chronic pain syndrome: Secondary | ICD-10-CM

## 2023-12-29 DIAGNOSIS — Z5181 Encounter for therapeutic drug level monitoring: Secondary | ICD-10-CM | POA: Diagnosis present

## 2023-12-29 MED ORDER — HYDROCODONE-ACETAMINOPHEN 10-325 MG PO TABS: 1.0000 | ORAL_TABLET | Freq: Four times a day (QID) | ORAL | 0 refills | Status: DC | PRN
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
## 2024-01-01 DIAGNOSIS — E782 Mixed hyperlipidemia: Secondary | ICD-10-CM | POA: Diagnosis not present

## 2024-01-01 DIAGNOSIS — R739 Hyperglycemia, unspecified: Secondary | ICD-10-CM | POA: Diagnosis not present

## 2024-01-01 DIAGNOSIS — E538 Deficiency of other specified B group vitamins: Secondary | ICD-10-CM | POA: Diagnosis not present

## 2024-01-12 DIAGNOSIS — E538 Deficiency of other specified B group vitamins: Secondary | ICD-10-CM | POA: Diagnosis not present

## 2024-01-12 DIAGNOSIS — Z1331 Encounter for screening for depression: Secondary | ICD-10-CM | POA: Diagnosis not present

## 2024-01-12 DIAGNOSIS — Z0000 Encounter for general adult medical examination without abnormal findings: Secondary | ICD-10-CM | POA: Diagnosis not present

## 2024-01-19 DIAGNOSIS — M1811 Unilateral primary osteoarthritis of first carpometacarpal joint, right hand: Secondary | ICD-10-CM | POA: Diagnosis not present

## 2024-02-25 ENCOUNTER — Encounter: Attending: Registered Nurse | Admitting: Registered Nurse

## 2024-02-25 ENCOUNTER — Encounter: Payer: Self-pay | Admitting: Registered Nurse

## 2024-02-25 VITALS — BP 103/69 | HR 68 | Ht <= 58 in | Wt 123.2 lb

## 2024-02-25 DIAGNOSIS — G8929 Other chronic pain: Secondary | ICD-10-CM | POA: Diagnosis not present

## 2024-02-25 DIAGNOSIS — M7061 Trochanteric bursitis, right hip: Secondary | ICD-10-CM | POA: Insufficient documentation

## 2024-02-25 DIAGNOSIS — M170 Bilateral primary osteoarthritis of knee: Secondary | ICD-10-CM | POA: Diagnosis not present

## 2024-02-25 DIAGNOSIS — M546 Pain in thoracic spine: Secondary | ICD-10-CM | POA: Diagnosis not present

## 2024-02-25 DIAGNOSIS — M25511 Pain in right shoulder: Secondary | ICD-10-CM | POA: Diagnosis not present

## 2024-02-25 DIAGNOSIS — G894 Chronic pain syndrome: Secondary | ICD-10-CM | POA: Diagnosis not present

## 2024-02-25 DIAGNOSIS — M5412 Radiculopathy, cervical region: Secondary | ICD-10-CM | POA: Diagnosis not present

## 2024-02-25 DIAGNOSIS — M47816 Spondylosis without myelopathy or radiculopathy, lumbar region: Secondary | ICD-10-CM | POA: Diagnosis not present

## 2024-02-25 DIAGNOSIS — M25512 Pain in left shoulder: Secondary | ICD-10-CM | POA: Diagnosis not present

## 2024-02-25 DIAGNOSIS — Z5181 Encounter for therapeutic drug level monitoring: Secondary | ICD-10-CM | POA: Diagnosis present

## 2024-02-25 DIAGNOSIS — Z79891 Long term (current) use of opiate analgesic: Secondary | ICD-10-CM | POA: Insufficient documentation

## 2024-02-25 DIAGNOSIS — M542 Cervicalgia: Secondary | ICD-10-CM | POA: Insufficient documentation

## 2024-02-25 DIAGNOSIS — M7062 Trochanteric bursitis, left hip: Secondary | ICD-10-CM | POA: Diagnosis not present

## 2024-02-25 MED ORDER — HYDROCODONE-ACETAMINOPHEN 10-325 MG PO TABS
1.0000 | ORAL_TABLET | Freq: Four times a day (QID) | ORAL | 0 refills | Status: DC | PRN
Start: 2024-02-25 — End: 2024-02-25

## 2024-02-25 MED ORDER — HYDROCODONE-ACETAMINOPHEN 10-325 MG PO TABS: 1.0000 | ORAL_TABLET | Freq: Four times a day (QID) | ORAL | 0 refills | Status: DC | PRN
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
## 2024-02-27 DIAGNOSIS — E538 Deficiency of other specified B group vitamins: Secondary | ICD-10-CM | POA: Diagnosis not present

## 2024-03-30 DIAGNOSIS — E538 Deficiency of other specified B group vitamins: Secondary | ICD-10-CM | POA: Diagnosis not present

## 2024-04-20 DIAGNOSIS — E042 Nontoxic multinodular goiter: Secondary | ICD-10-CM | POA: Diagnosis not present

## 2024-04-26 ENCOUNTER — Encounter: Payer: Self-pay | Admitting: Registered Nurse

## 2024-04-26 ENCOUNTER — Encounter: Attending: Registered Nurse | Admitting: Registered Nurse

## 2024-04-26 ENCOUNTER — Other Ambulatory Visit: Payer: Self-pay | Admitting: Registered Nurse

## 2024-04-26 VITALS — BP 96/62 | HR 74 | Ht <= 58 in | Wt 131.4 lb

## 2024-04-26 DIAGNOSIS — M546 Pain in thoracic spine: Secondary | ICD-10-CM | POA: Diagnosis not present

## 2024-04-26 DIAGNOSIS — M961 Postlaminectomy syndrome, not elsewhere classified: Secondary | ICD-10-CM | POA: Diagnosis not present

## 2024-04-26 DIAGNOSIS — Z79891 Long term (current) use of opiate analgesic: Secondary | ICD-10-CM | POA: Diagnosis not present

## 2024-04-26 DIAGNOSIS — M7061 Trochanteric bursitis, right hip: Secondary | ICD-10-CM | POA: Insufficient documentation

## 2024-04-26 DIAGNOSIS — M25512 Pain in left shoulder: Secondary | ICD-10-CM | POA: Diagnosis present

## 2024-04-26 DIAGNOSIS — G8929 Other chronic pain: Secondary | ICD-10-CM | POA: Insufficient documentation

## 2024-04-26 DIAGNOSIS — M25511 Pain in right shoulder: Secondary | ICD-10-CM | POA: Insufficient documentation

## 2024-04-26 DIAGNOSIS — G894 Chronic pain syndrome: Secondary | ICD-10-CM | POA: Insufficient documentation

## 2024-04-26 DIAGNOSIS — Z5181 Encounter for therapeutic drug level monitoring: Secondary | ICD-10-CM | POA: Diagnosis not present

## 2024-04-26 DIAGNOSIS — M542 Cervicalgia: Secondary | ICD-10-CM | POA: Diagnosis not present

## 2024-04-26 DIAGNOSIS — M7062 Trochanteric bursitis, left hip: Secondary | ICD-10-CM | POA: Diagnosis present

## 2024-04-26 DIAGNOSIS — M170 Bilateral primary osteoarthritis of knee: Secondary | ICD-10-CM | POA: Insufficient documentation

## 2024-04-26 DIAGNOSIS — M797 Fibromyalgia: Secondary | ICD-10-CM | POA: Insufficient documentation

## 2024-04-26 DIAGNOSIS — M47816 Spondylosis without myelopathy or radiculopathy, lumbar region: Secondary | ICD-10-CM | POA: Insufficient documentation

## 2024-04-26 DIAGNOSIS — M5412 Radiculopathy, cervical region: Secondary | ICD-10-CM | POA: Insufficient documentation

## 2024-04-26 MED ORDER — HYDROCODONE-ACETAMINOPHEN 10-325 MG PO TABS: 1.0000 | ORAL_TABLET | Freq: Four times a day (QID) | ORAL | 0 refills | Status: DC | PRN
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
## 2024-04-30 LAB — TOXASSURE SELECT 13 (MW), URINE

## 2024-05-04 DIAGNOSIS — R5383 Other fatigue: Secondary | ICD-10-CM | POA: Diagnosis not present

## 2024-05-04 DIAGNOSIS — E538 Deficiency of other specified B group vitamins: Secondary | ICD-10-CM | POA: Diagnosis not present

## 2024-05-04 DIAGNOSIS — G4710 Hypersomnia, unspecified: Secondary | ICD-10-CM | POA: Diagnosis not present

## 2024-06-04 DIAGNOSIS — E538 Deficiency of other specified B group vitamins: Secondary | ICD-10-CM | POA: Diagnosis not present

## 2024-06-24 ENCOUNTER — Encounter: Payer: Self-pay | Admitting: Registered Nurse

## 2024-06-24 ENCOUNTER — Encounter: Attending: Registered Nurse | Admitting: Registered Nurse

## 2024-06-24 VITALS — BP 90/57 | HR 67 | Ht <= 58 in | Wt 129.8 lb

## 2024-06-24 DIAGNOSIS — M797 Fibromyalgia: Secondary | ICD-10-CM | POA: Diagnosis present

## 2024-06-24 DIAGNOSIS — M546 Pain in thoracic spine: Secondary | ICD-10-CM | POA: Diagnosis not present

## 2024-06-24 DIAGNOSIS — Z5181 Encounter for therapeutic drug level monitoring: Secondary | ICD-10-CM | POA: Diagnosis not present

## 2024-06-24 DIAGNOSIS — M5412 Radiculopathy, cervical region: Secondary | ICD-10-CM

## 2024-06-24 DIAGNOSIS — G8929 Other chronic pain: Secondary | ICD-10-CM | POA: Diagnosis not present

## 2024-06-24 DIAGNOSIS — M542 Cervicalgia: Secondary | ICD-10-CM

## 2024-06-24 DIAGNOSIS — Z79891 Long term (current) use of opiate analgesic: Secondary | ICD-10-CM

## 2024-06-24 DIAGNOSIS — M7062 Trochanteric bursitis, left hip: Secondary | ICD-10-CM

## 2024-06-24 DIAGNOSIS — M7061 Trochanteric bursitis, right hip: Secondary | ICD-10-CM

## 2024-06-24 DIAGNOSIS — M170 Bilateral primary osteoarthritis of knee: Secondary | ICD-10-CM | POA: Diagnosis not present

## 2024-06-24 DIAGNOSIS — G894 Chronic pain syndrome: Secondary | ICD-10-CM

## 2024-06-24 DIAGNOSIS — M47816 Spondylosis without myelopathy or radiculopathy, lumbar region: Secondary | ICD-10-CM

## 2024-06-24 MED ORDER — HYDROCODONE-ACETAMINOPHEN 10-325 MG PO TABS: 1.0000 | ORAL_TABLET | Freq: Four times a day (QID) | ORAL | 0 refills | Status: DC | PRN
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
## 2024-07-06 DIAGNOSIS — E538 Deficiency of other specified B group vitamins: Secondary | ICD-10-CM | POA: Diagnosis not present

## 2024-08-06 DIAGNOSIS — E538 Deficiency of other specified B group vitamins: Secondary | ICD-10-CM | POA: Diagnosis not present

## 2024-08-11 DIAGNOSIS — H6123 Impacted cerumen, bilateral: Secondary | ICD-10-CM | POA: Diagnosis not present

## 2024-08-11 DIAGNOSIS — L299 Pruritus, unspecified: Secondary | ICD-10-CM | POA: Diagnosis not present

## 2024-08-24 ENCOUNTER — Encounter: Payer: Self-pay | Admitting: Registered Nurse

## 2024-08-24 ENCOUNTER — Encounter: Attending: Registered Nurse | Admitting: Registered Nurse

## 2024-08-24 VITALS — BP 100/60 | HR 74 | Ht <= 58 in | Wt 134.0 lb

## 2024-08-24 DIAGNOSIS — M5412 Radiculopathy, cervical region: Secondary | ICD-10-CM | POA: Insufficient documentation

## 2024-08-24 DIAGNOSIS — Z5181 Encounter for therapeutic drug level monitoring: Secondary | ICD-10-CM | POA: Diagnosis not present

## 2024-08-24 DIAGNOSIS — G8929 Other chronic pain: Secondary | ICD-10-CM | POA: Insufficient documentation

## 2024-08-24 DIAGNOSIS — M7061 Trochanteric bursitis, right hip: Secondary | ICD-10-CM | POA: Insufficient documentation

## 2024-08-24 DIAGNOSIS — G894 Chronic pain syndrome: Secondary | ICD-10-CM | POA: Diagnosis not present

## 2024-08-24 DIAGNOSIS — M170 Bilateral primary osteoarthritis of knee: Secondary | ICD-10-CM | POA: Diagnosis present

## 2024-08-24 DIAGNOSIS — M797 Fibromyalgia: Secondary | ICD-10-CM | POA: Insufficient documentation

## 2024-08-24 DIAGNOSIS — M47816 Spondylosis without myelopathy or radiculopathy, lumbar region: Secondary | ICD-10-CM | POA: Diagnosis not present

## 2024-08-24 DIAGNOSIS — M5416 Radiculopathy, lumbar region: Secondary | ICD-10-CM | POA: Insufficient documentation

## 2024-08-24 DIAGNOSIS — M546 Pain in thoracic spine: Secondary | ICD-10-CM | POA: Insufficient documentation

## 2024-08-24 DIAGNOSIS — M7062 Trochanteric bursitis, left hip: Secondary | ICD-10-CM | POA: Insufficient documentation

## 2024-08-24 DIAGNOSIS — M542 Cervicalgia: Secondary | ICD-10-CM | POA: Diagnosis not present

## 2024-08-24 DIAGNOSIS — Z79891 Long term (current) use of opiate analgesic: Secondary | ICD-10-CM | POA: Diagnosis not present

## 2024-08-24 MED ORDER — HYDROCODONE-ACETAMINOPHEN 10-325 MG PO TABS: 1.0000 | ORAL_TABLET | Freq: Four times a day (QID) | ORAL | 0 refills | Status: DC | PRN
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
## 2024-08-27 LAB — DRUG TOX MONITOR 1 W/CONF, ORAL FLD
AMINOCLONAZEPAM: NEGATIVE ng/mL (ref <0.50)
Alprazolam: 2.44 ng/mL — ABNORMAL HIGH (ref <0.50)
Amphetamines: NEGATIVE ng/mL (ref <10)
Barbiturates: NEGATIVE ng/mL (ref <10)
Benzodiazepines: POSITIVE ng/mL — AB (ref <0.50)
Buprenorphine: NEGATIVE ng/mL (ref <0.10)
Chlordiazepoxide: NEGATIVE ng/mL (ref <0.50)
Clonazepam: NEGATIVE ng/mL (ref <0.50)
Cocaine: NEGATIVE ng/mL (ref <5.0)
Codeine: NEGATIVE ng/mL (ref <2.5)
Diazepam: NEGATIVE ng/mL (ref <0.50)
Dihydrocodeine: 5.1 ng/mL — ABNORMAL HIGH (ref <2.5)
Fentanyl: NEGATIVE ng/mL (ref <0.10)
Flunitrazepam: NEGATIVE ng/mL (ref <0.50)
Flurazepam: NEGATIVE ng/mL (ref <0.50)
Heroin Metabolite: NEGATIVE ng/mL (ref <1.0)
Hydrocodone: 58.8 ng/mL — ABNORMAL HIGH (ref <2.5)
Hydromorphone: NEGATIVE ng/mL (ref <2.5)
Lorazepam: NEGATIVE ng/mL (ref <0.50)
MARIJUANA: NEGATIVE ng/mL (ref <2.5)
MDMA: NEGATIVE ng/mL (ref <10)
Meprobamate: NEGATIVE ng/mL (ref <2.5)
Methadone: NEGATIVE ng/mL (ref <5.0)
Midazolam: NEGATIVE ng/mL (ref <0.50)
Morphine: NEGATIVE ng/mL (ref <2.5)
Nicotine Metabolite: NEGATIVE ng/mL (ref <5.0)
Nordiazepam: NEGATIVE ng/mL (ref <0.50)
Norhydrocodone: 2.7 ng/mL — ABNORMAL HIGH (ref <2.5)
Noroxycodone: NEGATIVE ng/mL (ref <2.5)
Opiates: POSITIVE ng/mL — AB (ref <2.5)
Oxazepam: NEGATIVE ng/mL (ref <0.50)
Oxycodone: NEGATIVE ng/mL (ref <2.5)
Oxymorphone: NEGATIVE ng/mL (ref <2.5)
Phencyclidine: NEGATIVE ng/mL (ref <10)
Tapentadol: NEGATIVE ng/mL (ref <5.0)
Temazepam: NEGATIVE ng/mL (ref <0.50)
Tramadol: NEGATIVE ng/mL (ref <5.0)
Triazolam: NEGATIVE ng/mL (ref <0.50)
Zolpidem: NEGATIVE ng/mL (ref <5.0)

## 2024-08-27 LAB — DRUG TOX ALC METAB W/CON, ORAL FLD: Alcohol Metabolite: NEGATIVE ng/mL (ref <25)

## 2024-09-06 DIAGNOSIS — E538 Deficiency of other specified B group vitamins: Secondary | ICD-10-CM | POA: Diagnosis not present

## 2024-10-18 ENCOUNTER — Encounter: Attending: Registered Nurse | Admitting: Registered Nurse

## 2024-10-18 ENCOUNTER — Encounter: Payer: Self-pay | Admitting: Registered Nurse

## 2024-10-18 VITALS — BP 92/55 | HR 80 | Ht <= 58 in | Wt 128.4 lb

## 2024-10-18 DIAGNOSIS — M542 Cervicalgia: Secondary | ICD-10-CM | POA: Insufficient documentation

## 2024-10-18 DIAGNOSIS — M546 Pain in thoracic spine: Secondary | ICD-10-CM | POA: Insufficient documentation

## 2024-10-18 DIAGNOSIS — M797 Fibromyalgia: Secondary | ICD-10-CM | POA: Insufficient documentation

## 2024-10-18 DIAGNOSIS — G8929 Other chronic pain: Secondary | ICD-10-CM | POA: Insufficient documentation

## 2024-10-18 DIAGNOSIS — M5412 Radiculopathy, cervical region: Secondary | ICD-10-CM | POA: Diagnosis present

## 2024-10-18 DIAGNOSIS — Z5181 Encounter for therapeutic drug level monitoring: Secondary | ICD-10-CM | POA: Insufficient documentation

## 2024-10-18 DIAGNOSIS — M7061 Trochanteric bursitis, right hip: Secondary | ICD-10-CM | POA: Insufficient documentation

## 2024-10-18 DIAGNOSIS — M47816 Spondylosis without myelopathy or radiculopathy, lumbar region: Secondary | ICD-10-CM | POA: Insufficient documentation

## 2024-10-18 DIAGNOSIS — G894 Chronic pain syndrome: Secondary | ICD-10-CM | POA: Diagnosis present

## 2024-10-18 DIAGNOSIS — M7062 Trochanteric bursitis, left hip: Secondary | ICD-10-CM | POA: Diagnosis present

## 2024-10-18 DIAGNOSIS — Z79891 Long term (current) use of opiate analgesic: Secondary | ICD-10-CM | POA: Diagnosis present

## 2024-10-18 DIAGNOSIS — M170 Bilateral primary osteoarthritis of knee: Secondary | ICD-10-CM | POA: Diagnosis present

## 2024-10-18 MED ORDER — HYDROCODONE-ACETAMINOPHEN 10-325 MG PO TABS: 1.0000 | ORAL_TABLET | Freq: Four times a day (QID) | ORAL | 0 refills | Status: AC | PRN

## 2024-10-18 MED ORDER — HYDROCODONE-ACETAMINOPHEN 10-325 MG PO TABS: 1.0000 | ORAL_TABLET | Freq: Four times a day (QID) | ORAL | 0 refills | Status: DC | PRN
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
## 2024-11-17 ENCOUNTER — Other Ambulatory Visit: Payer: Self-pay | Admitting: Registered Nurse

## 2024-11-17 DIAGNOSIS — M47816 Spondylosis without myelopathy or radiculopathy, lumbar region: Secondary | ICD-10-CM

## 2024-11-17 DIAGNOSIS — M542 Cervicalgia: Secondary | ICD-10-CM

## 2024-11-17 DIAGNOSIS — G894 Chronic pain syndrome: Secondary | ICD-10-CM

## 2024-11-17 DIAGNOSIS — M170 Bilateral primary osteoarthritis of knee: Secondary | ICD-10-CM

## 2024-11-17 DIAGNOSIS — G8929 Other chronic pain: Secondary | ICD-10-CM

## 2024-11-19 NOTE — Telephone Encounter (Signed)
 Requested Prescriptions   Pending Prescriptions Disp Refills   HYDROcodone -acetaminophen  (NORCO) 10-325 MG tablet [Pharmacy Med Name: HYDROCODONE -APAP 10-325 MG TAB] 120 tablet     Sig: TAKE 1 TABLET BY MOUTH EVERY 6 HOURS AS NEEDED FOR MODERATE PAIN     Date of patient request: 11/19/2024 Last office visit: 10/18/2024 Upcoming visit: 12/14/2024 Date of last refill: 10/18/2024 Last refill amount: #120 0 refills

## 2024-12-14 ENCOUNTER — Encounter: Admitting: Registered Nurse
